# Patient Record
Sex: Female | Born: 1945 | Race: White | Hispanic: No | Smoking: Former smoker
Health system: Southern US, Community
[De-identification: ages and names within clinical notes are randomized; demographics above are authoritative.]

## PROBLEM LIST (undated history)

## (undated) DIAGNOSIS — I4891 Unspecified atrial fibrillation: Secondary | ICD-10-CM

## (undated) DIAGNOSIS — N189 Chronic kidney disease, unspecified: Secondary | ICD-10-CM

## (undated) DIAGNOSIS — I519 Heart disease, unspecified: Secondary | ICD-10-CM

## (undated) DIAGNOSIS — M329 Systemic lupus erythematosus, unspecified: Secondary | ICD-10-CM

## (undated) DIAGNOSIS — F32A Depression, unspecified: Secondary | ICD-10-CM

## (undated) DIAGNOSIS — F329 Major depressive disorder, single episode, unspecified: Secondary | ICD-10-CM

## (undated) DIAGNOSIS — I447 Left bundle-branch block, unspecified: Secondary | ICD-10-CM

## (undated) DIAGNOSIS — I2699 Other pulmonary embolism without acute cor pulmonale: Secondary | ICD-10-CM

## (undated) DIAGNOSIS — I255 Ischemic cardiomyopathy: Secondary | ICD-10-CM

## (undated) DIAGNOSIS — I639 Cerebral infarction, unspecified: Secondary | ICD-10-CM

## (undated) DIAGNOSIS — M3500 Sicca syndrome, unspecified: Secondary | ICD-10-CM

## (undated) DIAGNOSIS — E669 Obesity, unspecified: Secondary | ICD-10-CM

## (undated) DIAGNOSIS — I2510 Atherosclerotic heart disease of native coronary artery without angina pectoris: Secondary | ICD-10-CM

## (undated) DIAGNOSIS — E785 Hyperlipidemia, unspecified: Secondary | ICD-10-CM

## (undated) DIAGNOSIS — I420 Dilated cardiomyopathy: Secondary | ICD-10-CM

## (undated) DIAGNOSIS — M069 Rheumatoid arthritis, unspecified: Secondary | ICD-10-CM

## (undated) DIAGNOSIS — I10 Essential (primary) hypertension: Secondary | ICD-10-CM

## (undated) DIAGNOSIS — L409 Psoriasis, unspecified: Secondary | ICD-10-CM

## (undated) DIAGNOSIS — M797 Fibromyalgia: Secondary | ICD-10-CM

## (undated) DIAGNOSIS — IMO0002 Reserved for concepts with insufficient information to code with codable children: Secondary | ICD-10-CM

## (undated) HISTORY — DX: Essential (primary) hypertension: I10

## (undated) HISTORY — DX: Unspecified atrial fibrillation: I48.91

## (undated) HISTORY — DX: Obesity, unspecified: E66.9

## (undated) HISTORY — DX: Rheumatoid arthritis, unspecified: M06.9

## (undated) HISTORY — DX: Atherosclerotic heart disease of native coronary artery without angina pectoris: I25.10

## (undated) HISTORY — DX: Sjogren syndrome, unspecified: M35.00

## (undated) HISTORY — DX: Reserved for concepts with insufficient information to code with codable children: IMO0002

## (undated) HISTORY — DX: Left bundle-branch block, unspecified: I44.7

## (undated) HISTORY — DX: Ischemic cardiomyopathy: I42.0

## (undated) HISTORY — DX: Hyperlipidemia, unspecified: E78.5

## (undated) HISTORY — DX: Systemic lupus erythematosus, unspecified: M32.9

## (undated) HISTORY — DX: Ischemic cardiomyopathy: I25.5

## (undated) HISTORY — DX: Major depressive disorder, single episode, unspecified: F32.9

## (undated) HISTORY — DX: Psoriasis, unspecified: L40.9

## (undated) HISTORY — PX: OTHER SURGICAL HISTORY: SHX169

## (undated) HISTORY — DX: Heart disease, unspecified: I51.9

## (undated) HISTORY — DX: Chronic kidney disease, unspecified: N18.9

## (undated) HISTORY — DX: Fibromyalgia: M79.7

## (undated) HISTORY — DX: Other pulmonary embolism without acute cor pulmonale: I26.99

## (undated) HISTORY — DX: Cerebral infarction, unspecified: I63.9

## (undated) HISTORY — DX: Depression, unspecified: F32.A

---

## 1980-08-20 HISTORY — PX: MASTECTOMY: SHX3

## 1998-03-08 ENCOUNTER — Ambulatory Visit (HOSPITAL_COMMUNITY): Admission: RE | Admit: 1998-03-08 | Discharge: 1998-03-08 | Payer: Self-pay | Admitting: Family Medicine

## 1999-06-02 ENCOUNTER — Encounter: Admission: RE | Admit: 1999-06-02 | Discharge: 1999-06-02 | Payer: Self-pay | Admitting: Sports Medicine

## 1999-08-28 ENCOUNTER — Encounter: Payer: Self-pay | Admitting: Internal Medicine

## 1999-08-28 ENCOUNTER — Inpatient Hospital Stay (HOSPITAL_COMMUNITY): Admission: EM | Admit: 1999-08-28 | Discharge: 1999-09-09 | Payer: Self-pay | Admitting: Internal Medicine

## 1999-08-29 ENCOUNTER — Encounter: Payer: Self-pay | Admitting: Internal Medicine

## 1999-08-30 ENCOUNTER — Encounter: Payer: Self-pay | Admitting: Internal Medicine

## 1999-08-31 ENCOUNTER — Encounter: Payer: Self-pay | Admitting: Internal Medicine

## 1999-08-31 ENCOUNTER — Encounter: Payer: Self-pay | Admitting: Pulmonary Disease

## 1999-09-01 ENCOUNTER — Encounter: Payer: Self-pay | Admitting: Pulmonary Disease

## 1999-09-02 ENCOUNTER — Encounter: Payer: Self-pay | Admitting: Pulmonary Disease

## 1999-09-05 ENCOUNTER — Encounter: Payer: Self-pay | Admitting: Pulmonary Disease

## 1999-09-09 ENCOUNTER — Encounter: Payer: Self-pay | Admitting: Internal Medicine

## 1999-10-17 ENCOUNTER — Ambulatory Visit: Admission: RE | Admit: 1999-10-17 | Discharge: 1999-10-17 | Payer: Self-pay | Admitting: Pulmonary Disease

## 2000-08-20 DIAGNOSIS — I2510 Atherosclerotic heart disease of native coronary artery without angina pectoris: Secondary | ICD-10-CM

## 2000-08-20 HISTORY — PX: ANGIOPLASTY: SHX39

## 2000-08-20 HISTORY — DX: Atherosclerotic heart disease of native coronary artery without angina pectoris: I25.10

## 2000-11-07 ENCOUNTER — Ambulatory Visit (HOSPITAL_COMMUNITY): Admission: RE | Admit: 2000-11-07 | Discharge: 2000-11-07 | Payer: Self-pay | Admitting: Family Medicine

## 2000-11-07 ENCOUNTER — Encounter: Payer: Self-pay | Admitting: Family Medicine

## 2000-11-14 ENCOUNTER — Ambulatory Visit (HOSPITAL_COMMUNITY): Admission: RE | Admit: 2000-11-14 | Discharge: 2000-11-15 | Payer: Self-pay | Admitting: *Deleted

## 2001-04-15 ENCOUNTER — Encounter (HOSPITAL_COMMUNITY): Admission: RE | Admit: 2001-04-15 | Discharge: 2001-07-14 | Payer: Self-pay | Admitting: Cardiology

## 2001-05-05 ENCOUNTER — Ambulatory Visit (HOSPITAL_COMMUNITY): Admission: RE | Admit: 2001-05-05 | Discharge: 2001-05-05 | Payer: Self-pay | Admitting: Cardiology

## 2001-06-18 ENCOUNTER — Encounter: Payer: Self-pay | Admitting: Family Medicine

## 2001-06-18 ENCOUNTER — Ambulatory Visit (HOSPITAL_COMMUNITY): Admission: RE | Admit: 2001-06-18 | Discharge: 2001-06-18 | Payer: Self-pay | Admitting: Family Medicine

## 2001-07-14 ENCOUNTER — Encounter: Payer: Self-pay | Admitting: Surgery

## 2001-07-14 ENCOUNTER — Encounter: Admission: RE | Admit: 2001-07-14 | Discharge: 2001-07-14 | Payer: Self-pay | Admitting: Surgery

## 2001-12-13 ENCOUNTER — Encounter: Payer: Self-pay | Admitting: Emergency Medicine

## 2001-12-13 ENCOUNTER — Emergency Department (HOSPITAL_COMMUNITY): Admission: EM | Admit: 2001-12-13 | Discharge: 2001-12-13 | Payer: Self-pay | Admitting: Emergency Medicine

## 2002-04-14 ENCOUNTER — Encounter: Payer: Self-pay | Admitting: Emergency Medicine

## 2002-04-15 ENCOUNTER — Inpatient Hospital Stay (HOSPITAL_COMMUNITY): Admission: EM | Admit: 2002-04-15 | Discharge: 2002-04-17 | Payer: Self-pay | Admitting: Emergency Medicine

## 2002-04-24 ENCOUNTER — Ambulatory Visit (HOSPITAL_COMMUNITY): Admission: RE | Admit: 2002-04-24 | Discharge: 2002-04-24 | Payer: Self-pay | Admitting: Cardiology

## 2002-06-29 ENCOUNTER — Inpatient Hospital Stay (HOSPITAL_COMMUNITY): Admission: EM | Admit: 2002-06-29 | Discharge: 2002-07-01 | Payer: Self-pay

## 2002-06-30 ENCOUNTER — Encounter: Payer: Self-pay | Admitting: Internal Medicine

## 2002-10-08 ENCOUNTER — Encounter: Admission: RE | Admit: 2002-10-08 | Discharge: 2003-01-06 | Payer: Self-pay | Admitting: Family Medicine

## 2003-03-21 ENCOUNTER — Encounter: Payer: Self-pay | Admitting: Emergency Medicine

## 2003-03-21 ENCOUNTER — Inpatient Hospital Stay (HOSPITAL_COMMUNITY): Admission: EM | Admit: 2003-03-21 | Discharge: 2003-03-22 | Payer: Self-pay

## 2003-04-05 ENCOUNTER — Encounter: Admission: RE | Admit: 2003-04-05 | Discharge: 2003-04-05 | Payer: Self-pay | Admitting: Family Medicine

## 2003-04-05 ENCOUNTER — Encounter: Payer: Self-pay | Admitting: Family Medicine

## 2003-10-11 ENCOUNTER — Inpatient Hospital Stay (HOSPITAL_COMMUNITY): Admission: EM | Admit: 2003-10-11 | Discharge: 2003-10-16 | Payer: Self-pay

## 2003-10-11 ENCOUNTER — Encounter (INDEPENDENT_AMBULATORY_CARE_PROVIDER_SITE_OTHER): Payer: Self-pay | Admitting: Cardiology

## 2003-10-19 ENCOUNTER — Ambulatory Visit (HOSPITAL_COMMUNITY): Admission: RE | Admit: 2003-10-19 | Discharge: 2003-10-19 | Payer: Self-pay | Admitting: Cardiology

## 2003-11-08 ENCOUNTER — Inpatient Hospital Stay (HOSPITAL_COMMUNITY): Admission: EM | Admit: 2003-11-08 | Discharge: 2003-11-10 | Payer: Self-pay | Admitting: Emergency Medicine

## 2004-04-15 ENCOUNTER — Ambulatory Visit (HOSPITAL_COMMUNITY): Admission: RE | Admit: 2004-04-15 | Discharge: 2004-04-15 | Payer: Self-pay | Admitting: Obstetrics and Gynecology

## 2004-09-24 ENCOUNTER — Inpatient Hospital Stay (HOSPITAL_COMMUNITY): Admission: EM | Admit: 2004-09-24 | Discharge: 2004-10-01 | Payer: Self-pay | Admitting: Emergency Medicine

## 2004-09-25 ENCOUNTER — Encounter (INDEPENDENT_AMBULATORY_CARE_PROVIDER_SITE_OTHER): Payer: Self-pay | Admitting: Interventional Cardiology

## 2005-09-02 ENCOUNTER — Emergency Department (HOSPITAL_COMMUNITY): Admission: EM | Admit: 2005-09-02 | Discharge: 2005-09-02 | Payer: Self-pay | Admitting: Emergency Medicine

## 2006-01-24 ENCOUNTER — Emergency Department (HOSPITAL_COMMUNITY): Admission: EM | Admit: 2006-01-24 | Discharge: 2006-01-24 | Payer: Self-pay | Admitting: Emergency Medicine

## 2006-03-27 ENCOUNTER — Emergency Department (HOSPITAL_COMMUNITY): Admission: EM | Admit: 2006-03-27 | Discharge: 2006-03-27 | Payer: Self-pay | Admitting: *Deleted

## 2006-05-21 ENCOUNTER — Observation Stay (HOSPITAL_COMMUNITY): Admission: EM | Admit: 2006-05-21 | Discharge: 2006-05-22 | Payer: Self-pay | Admitting: Emergency Medicine

## 2006-07-10 ENCOUNTER — Ambulatory Visit (HOSPITAL_COMMUNITY): Admission: RE | Admit: 2006-07-10 | Discharge: 2006-07-10 | Payer: Self-pay | Admitting: *Deleted

## 2006-09-01 ENCOUNTER — Emergency Department (HOSPITAL_COMMUNITY): Admission: EM | Admit: 2006-09-01 | Discharge: 2006-09-01 | Payer: Self-pay | Admitting: Emergency Medicine

## 2006-09-01 ENCOUNTER — Inpatient Hospital Stay (HOSPITAL_COMMUNITY): Admission: EM | Admit: 2006-09-01 | Discharge: 2006-09-12 | Payer: Self-pay | Admitting: Emergency Medicine

## 2006-12-02 ENCOUNTER — Emergency Department (HOSPITAL_COMMUNITY): Admission: EM | Admit: 2006-12-02 | Discharge: 2006-12-03 | Payer: Self-pay | Admitting: *Deleted

## 2007-04-04 ENCOUNTER — Ambulatory Visit: Payer: Self-pay | Admitting: Pulmonary Disease

## 2007-04-10 ENCOUNTER — Ambulatory Visit: Payer: Self-pay | Admitting: Pulmonary Disease

## 2007-04-11 ENCOUNTER — Ambulatory Visit: Payer: Self-pay | Admitting: Cardiovascular Disease

## 2007-04-26 ENCOUNTER — Emergency Department (HOSPITAL_COMMUNITY): Admission: EM | Admit: 2007-04-26 | Discharge: 2007-04-26 | Payer: Self-pay | Admitting: Emergency Medicine

## 2007-05-01 ENCOUNTER — Ambulatory Visit: Payer: Self-pay | Admitting: Pulmonary Disease

## 2007-05-01 LAB — CONVERTED CEMR LAB
ANA Titer 1: 1:160 {titer} — ABNORMAL HIGH
Anti Nuclear Antibody(ANA): POSITIVE — AB
Neutrophil cytoplasmic antibodies,IgG,serum: <1:20 {titer}
Scleroderma (Scl-70) (ENA) Antibody, IgG: <0.2 (ref <1.0)
ds DNA Ab: <1 (ref <5)

## 2007-05-17 ENCOUNTER — Inpatient Hospital Stay (HOSPITAL_COMMUNITY): Admission: EM | Admit: 2007-05-17 | Discharge: 2007-05-20 | Payer: Self-pay | Admitting: Emergency Medicine

## 2007-07-08 ENCOUNTER — Inpatient Hospital Stay (HOSPITAL_COMMUNITY): Admission: EM | Admit: 2007-07-08 | Discharge: 2007-07-23 | Payer: Self-pay | Admitting: Emergency Medicine

## 2007-07-08 ENCOUNTER — Ambulatory Visit: Payer: Self-pay | Admitting: Pulmonary Disease

## 2007-07-21 ENCOUNTER — Encounter: Payer: Self-pay | Admitting: Pulmonary Disease

## 2007-07-22 ENCOUNTER — Encounter: Payer: Self-pay | Admitting: Pulmonary Disease

## 2007-08-04 ENCOUNTER — Ambulatory Visit: Payer: Self-pay | Admitting: Pulmonary Disease

## 2007-08-05 ENCOUNTER — Ambulatory Visit: Payer: Self-pay | Admitting: Critical Care Medicine

## 2007-08-05 ENCOUNTER — Inpatient Hospital Stay (HOSPITAL_COMMUNITY): Admission: RE | Admit: 2007-08-05 | Discharge: 2007-08-13 | Payer: Self-pay | Admitting: Cardiology

## 2007-08-12 HISTORY — PX: OTHER SURGICAL HISTORY: SHX169

## 2007-08-18 ENCOUNTER — Ambulatory Visit: Payer: Self-pay | Admitting: Pulmonary Disease

## 2007-09-09 DIAGNOSIS — F329 Major depressive disorder, single episode, unspecified: Secondary | ICD-10-CM

## 2007-09-09 DIAGNOSIS — J45909 Unspecified asthma, uncomplicated: Secondary | ICD-10-CM | POA: Insufficient documentation

## 2007-11-01 ENCOUNTER — Emergency Department (HOSPITAL_COMMUNITY): Admission: EM | Admit: 2007-11-01 | Discharge: 2007-11-02 | Payer: Self-pay | Admitting: Emergency Medicine

## 2008-04-01 IMAGING — CR DG CHEST 1V PORT
1 series · 1 of 1 positions shown · non-contrast
Comparison: none

CLINICAL DATA: Chest pain.  
 PORTABLE CHEST ? 1 VIEW ? 08/05/07 ? 2988 HOURS:

[AP]
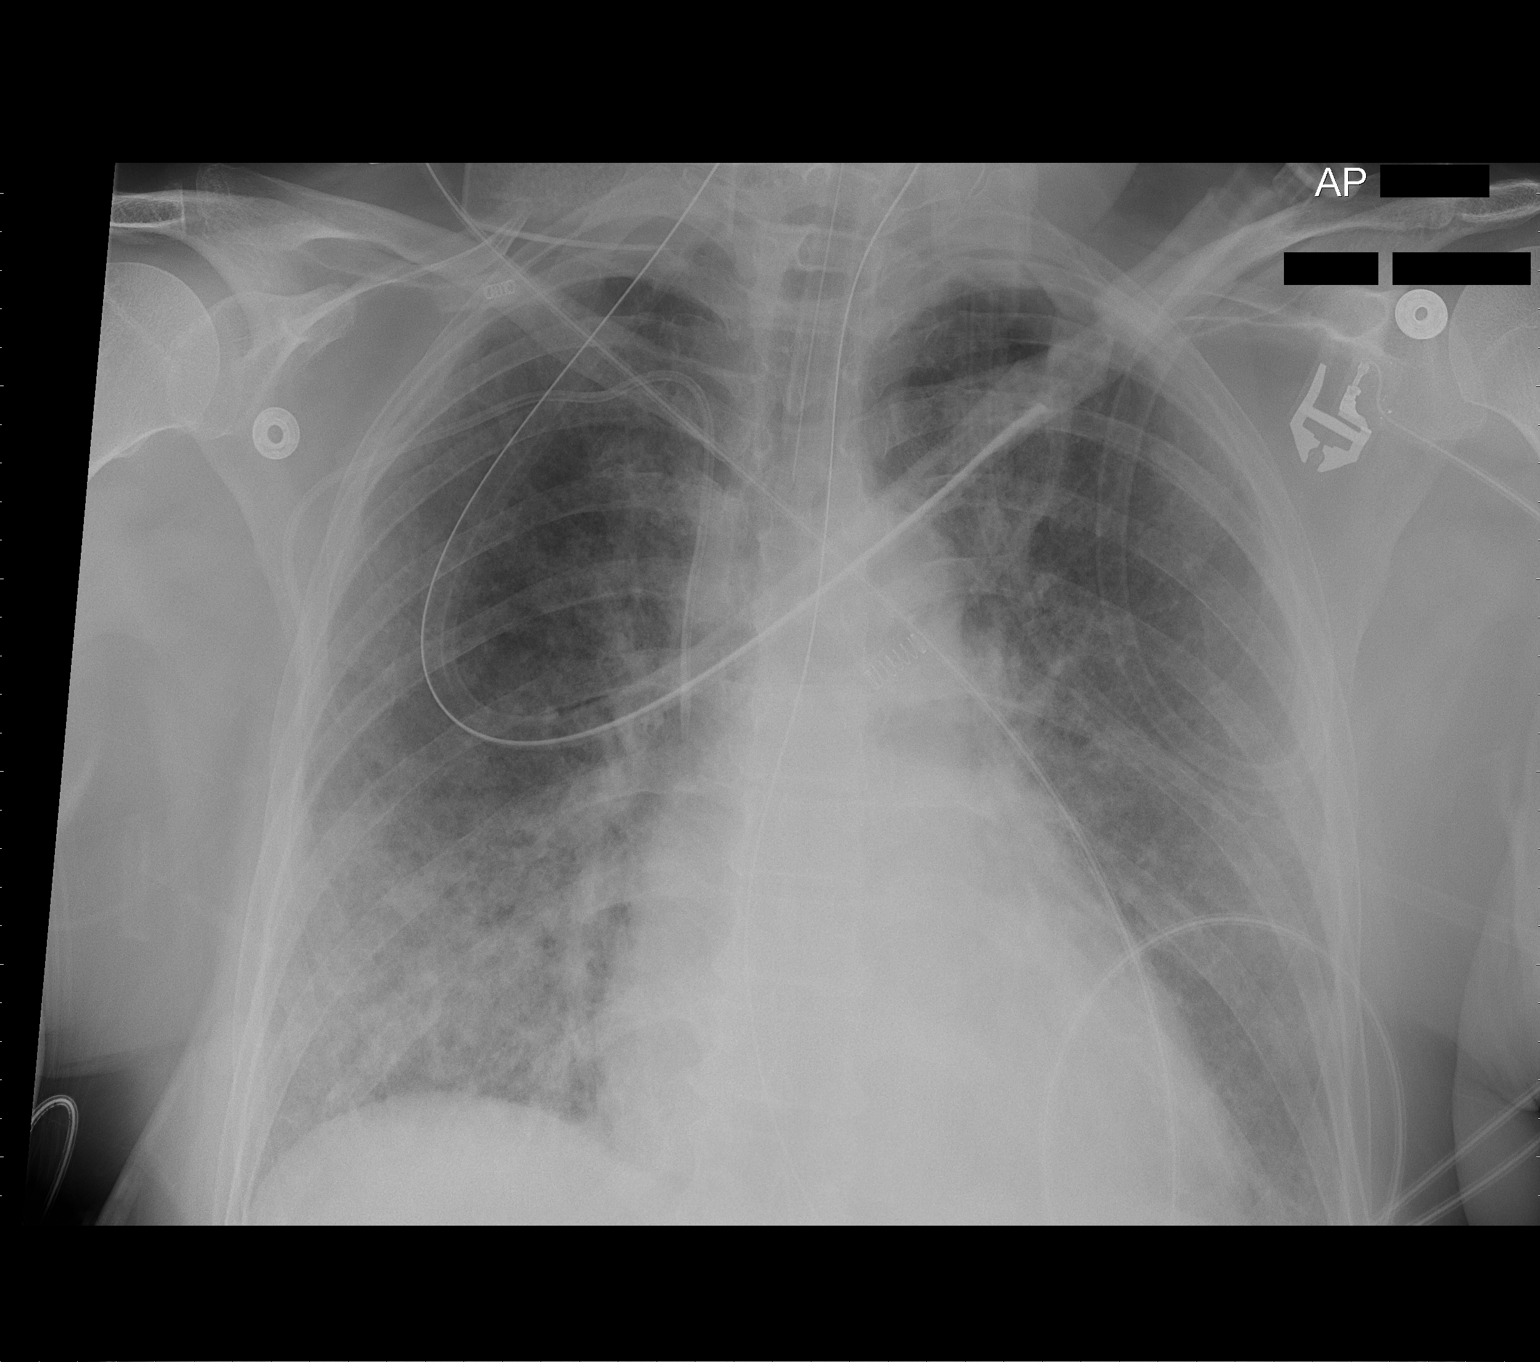

[1 of 1 positions shown; findings below may reference images not displayed]

FINDINGS: Endotracheal tube tip 4 cm above the carina.  Right central line tip superior vena cava.  No pneumothorax.  Feeding tube courses below the diaphragm.  Tip is not included on present exam.  Cardiomegaly.  Slight improvement in diffuse slightly asymmetric airspace disease.  This may represent improvement of pulmonary edema although infectious infiltrate cannot be completely excluded in the appropriate clinical setting.
IMPRESSION: 1.  Slight improvement in diffuse asymmetric airspace disease.  
 2.  Nasogastric tube has been placed otherwise no significant change.

## 2008-05-20 ENCOUNTER — Inpatient Hospital Stay (HOSPITAL_COMMUNITY): Admission: EM | Admit: 2008-05-20 | Discharge: 2008-05-25 | Payer: Self-pay | Admitting: Emergency Medicine

## 2008-05-20 ENCOUNTER — Encounter (INDEPENDENT_AMBULATORY_CARE_PROVIDER_SITE_OTHER): Payer: Self-pay | Admitting: Internal Medicine

## 2008-05-20 ENCOUNTER — Ambulatory Visit: Payer: Self-pay | Admitting: Vascular Surgery

## 2008-05-20 ENCOUNTER — Ambulatory Visit (HOSPITAL_COMMUNITY): Admission: RE | Admit: 2008-05-20 | Discharge: 2008-05-20 | Payer: Self-pay | Admitting: Cardiology

## 2009-10-05 ENCOUNTER — Encounter: Admission: RE | Admit: 2009-10-05 | Discharge: 2009-10-05 | Payer: Self-pay | Admitting: Family Medicine

## 2010-03-10 ENCOUNTER — Inpatient Hospital Stay (HOSPITAL_COMMUNITY): Admission: EM | Admit: 2010-03-10 | Discharge: 2010-03-13 | Payer: Self-pay | Admitting: Emergency Medicine

## 2010-03-10 ENCOUNTER — Encounter (INDEPENDENT_AMBULATORY_CARE_PROVIDER_SITE_OTHER): Payer: Self-pay | Admitting: Internal Medicine

## 2010-03-12 ENCOUNTER — Encounter (INDEPENDENT_AMBULATORY_CARE_PROVIDER_SITE_OTHER): Payer: Self-pay | Admitting: Internal Medicine

## 2010-08-19 ENCOUNTER — Inpatient Hospital Stay (HOSPITAL_COMMUNITY): Admission: EM | Admit: 2010-08-19 | Discharge: 2010-08-22 | Payer: Self-pay | Source: Home / Self Care

## 2010-08-22 ENCOUNTER — Other Ambulatory Visit: Payer: Self-pay | Admitting: Internal Medicine

## 2010-08-22 ENCOUNTER — Encounter (INDEPENDENT_AMBULATORY_CARE_PROVIDER_SITE_OTHER): Payer: Self-pay | Admitting: Internal Medicine

## 2010-10-30 LAB — DIFFERENTIAL
Basophils Absolute: 0.1 10*3/uL (ref 0.0–0.1)
Basophils Absolute: 0.1 10*3/uL (ref 0.0–0.1)
Eosinophils Absolute: 0.1 10*3/uL (ref 0.0–0.7)
Eosinophils Relative: 2 % (ref 0–5)
Lymphocytes Relative: 13 % (ref 12–46)
Lymphocytes Relative: 18 % (ref 12–46)
Lymphs Abs: 1.2 10*3/uL (ref 0.7–4.0)
Lymphs Abs: 1.3 10*3/uL (ref 0.7–4.0)
Monocytes Absolute: 0.7 10*3/uL (ref 0.1–1.0)
Monocytes Relative: 14 % — ABNORMAL HIGH (ref 3–12)
Monocytes Relative: 7 % (ref 3–12)
Neutro Abs: 4.5 10*3/uL (ref 1.7–7.7)
Neutrophils Relative %: 55 % (ref 43–77)
Neutrophils Relative %: 67 % (ref 43–77)

## 2010-10-30 LAB — CBC
HCT: 30.9 % — ABNORMAL LOW (ref 36.0–46.0)
HCT: 32.2 % — ABNORMAL LOW (ref 36.0–46.0)
HCT: 35.9 % — ABNORMAL LOW (ref 36.0–46.0)
Hemoglobin: 11.8 g/dL — ABNORMAL LOW (ref 12.0–15.0)
MCH: 30.3 pg (ref 26.0–34.0)
MCHC: 32.6 g/dL (ref 30.0–36.0)
MCHC: 33.3 g/dL (ref 30.0–36.0)
MCV: 92.3 fL (ref 78.0–100.0)
Platelets: 183 10*3/uL (ref 150–400)
Platelets: 187 10*3/uL (ref 150–400)
Platelets: 194 10*3/uL (ref 150–400)
Platelets: 246 10*3/uL (ref 150–400)
RBC: 3.89 MIL/uL (ref 3.87–5.11)
RDW: 13.8 % (ref 11.5–15.5)
RDW: 13.9 % (ref 11.5–15.5)
RDW: 13.9 % (ref 11.5–15.5)
WBC: 4.9 10*3/uL (ref 4.0–10.5)
WBC: 6.7 10*3/uL (ref 4.0–10.5)
WBC: 7.2 10*3/uL (ref 4.0–10.5)

## 2010-10-30 LAB — GLUCOSE, CAPILLARY
Glucose-Capillary: 102 mg/dL — ABNORMAL HIGH (ref 70–99)
Glucose-Capillary: 110 mg/dL — ABNORMAL HIGH (ref 70–99)
Glucose-Capillary: 112 mg/dL — ABNORMAL HIGH (ref 70–99)
Glucose-Capillary: 112 mg/dL — ABNORMAL HIGH (ref 70–99)
Glucose-Capillary: 134 mg/dL — ABNORMAL HIGH (ref 70–99)
Glucose-Capillary: 143 mg/dL — ABNORMAL HIGH (ref 70–99)
Glucose-Capillary: 85 mg/dL (ref 70–99)
Glucose-Capillary: 97 mg/dL (ref 70–99)

## 2010-10-30 LAB — PROTIME-INR
INR: 1.35 (ref 0.00–1.49)
INR: 1.43 (ref 0.00–1.49)
Prothrombin Time: 16.9 seconds — ABNORMAL HIGH (ref 11.6–15.2)
Prothrombin Time: 20.8 seconds — ABNORMAL HIGH (ref 11.6–15.2)

## 2010-10-30 LAB — COMPREHENSIVE METABOLIC PANEL
ALT: 15 U/L (ref 0–35)
ALT: 18 U/L (ref 0–35)
AST: 25 U/L (ref 0–37)
Albumin: 3.3 g/dL — ABNORMAL LOW (ref 3.5–5.2)
Albumin: 3.3 g/dL — ABNORMAL LOW (ref 3.5–5.2)
Alkaline Phosphatase: 73 U/L (ref 39–117)
Alkaline Phosphatase: 89 U/L (ref 39–117)
BUN: 31 mg/dL — ABNORMAL HIGH (ref 6–23)
CO2: 29 mEq/L (ref 19–32)
Calcium: 8.9 mg/dL (ref 8.4–10.5)
Calcium: 9.2 mg/dL (ref 8.4–10.5)
Calcium: 9.5 mg/dL (ref 8.4–10.5)
Creatinine, Ser: 1.62 mg/dL — ABNORMAL HIGH (ref 0.4–1.2)
Creatinine, Ser: 2.08 mg/dL — ABNORMAL HIGH (ref 0.4–1.2)
GFR calc Af Amer: 36 mL/min — ABNORMAL LOW (ref >60)
GFR calc Af Amer: 39 mL/min — ABNORMAL LOW (ref >60)
GFR calc non Af Amer: 32 mL/min — ABNORMAL LOW (ref >60)
Glucose, Bld: 138 mg/dL — ABNORMAL HIGH (ref 70–99)
Glucose, Bld: 96 mg/dL (ref 70–99)
Potassium: 3.6 mEq/L (ref 3.5–5.1)
Sodium: 142 mEq/L (ref 135–145)
Total Protein: 6.1 g/dL (ref 6.0–8.3)
Total Protein: 6.7 g/dL (ref 6.0–8.3)

## 2010-10-30 LAB — CK TOTAL AND CKMB (NOT AT ARMC)
Relative Index: INVALID (ref 0.0–2.5)
Total CK: 37 U/L (ref 7–177)

## 2010-10-30 LAB — URINE MICROSCOPIC-ADD ON

## 2010-10-30 LAB — TROPONIN I: Troponin I: 0.02 ng/mL (ref 0.00–0.06)

## 2010-10-30 LAB — BASIC METABOLIC PANEL
BUN: 31 mg/dL — ABNORMAL HIGH (ref 6–23)
CO2: 24 mEq/L (ref 19–32)
Calcium: 9.4 mg/dL (ref 8.4–10.5)
Glucose, Bld: 205 mg/dL — ABNORMAL HIGH (ref 70–99)
Potassium: 4.1 mEq/L (ref 3.5–5.1)

## 2010-10-30 LAB — URINALYSIS, ROUTINE W REFLEX MICROSCOPIC
Bilirubin Urine: NEGATIVE
Specific Gravity, Urine: 1.011 (ref 1.005–1.030)
Urobilinogen, UA: 0.2 mg/dL (ref 0.0–1.0)
pH: 5.5 (ref 5.0–8.0)

## 2010-10-30 LAB — MAGNESIUM: Magnesium: 2.4 mg/dL (ref 1.5–2.5)

## 2010-10-30 LAB — POCT CARDIAC MARKERS: Myoglobin, poc: 101 ng/mL (ref 12–200)

## 2010-10-30 LAB — LIPID PANEL
HDL: 40 mg/dL (ref >39)
Total CHOL/HDL Ratio: 3.4 RATIO
Triglycerides: 82 mg/dL (ref <150)

## 2010-10-30 LAB — HEMOGLOBIN A1C: Hgb A1c MFr Bld: 6.5 % — ABNORMAL HIGH (ref <5.7)

## 2010-10-30 LAB — APTT: aPTT: 28 seconds (ref 24–37)

## 2010-11-04 LAB — CULTURE, BLOOD (ROUTINE X 2)

## 2010-11-04 LAB — GLUCOSE, CAPILLARY
Glucose-Capillary: 233 mg/dL — ABNORMAL HIGH (ref 70–99)
Glucose-Capillary: 244 mg/dL — ABNORMAL HIGH (ref 70–99)
Glucose-Capillary: 267 mg/dL — ABNORMAL HIGH (ref 70–99)
Glucose-Capillary: 312 mg/dL — ABNORMAL HIGH (ref 70–99)
Glucose-Capillary: 439 mg/dL — ABNORMAL HIGH (ref 70–99)

## 2010-11-04 LAB — CBC
HCT: 26 % — ABNORMAL LOW (ref 36.0–46.0)
HCT: 29.4 % — ABNORMAL LOW (ref 36.0–46.0)
Hemoglobin: 11.2 g/dL — ABNORMAL LOW (ref 12.0–15.0)
Hemoglobin: 9.1 g/dL — ABNORMAL LOW (ref 12.0–15.0)
MCHC: 33.6 g/dL (ref 30.0–36.0)
MCHC: 33.8 g/dL (ref 30.0–36.0)
MCHC: 33.8 g/dL (ref 30.0–36.0)
MCHC: 34 g/dL (ref 30.0–36.0)
MCV: 93.7 fL (ref 78.0–100.0)
Platelets: 236 10*3/uL (ref 150–400)
RBC: 3.59 MIL/uL — ABNORMAL LOW (ref 3.87–5.11)
RDW: 13.2 % (ref 11.5–15.5)
RDW: 13.3 % (ref 11.5–15.5)
RDW: 14.1 % (ref 11.5–15.5)
WBC: 38.2 10*3/uL — ABNORMAL HIGH (ref 4.0–10.5)
WBC: 45.3 10*3/uL — ABNORMAL HIGH (ref 4.0–10.5)

## 2010-11-04 LAB — PROTIME-INR
INR: 3.11 — ABNORMAL HIGH (ref 0.00–1.49)
Prothrombin Time: 30.6 seconds — ABNORMAL HIGH (ref 11.6–15.2)
Prothrombin Time: 31.8 seconds — ABNORMAL HIGH (ref 11.6–15.2)

## 2010-11-04 LAB — BASIC METABOLIC PANEL
BUN: 30 mg/dL — ABNORMAL HIGH (ref 6–23)
BUN: 34 mg/dL — ABNORMAL HIGH (ref 6–23)
BUN: 35 mg/dL — ABNORMAL HIGH (ref 6–23)
CO2: 19 mEq/L (ref 19–32)
Calcium: 8.6 mg/dL (ref 8.4–10.5)
Calcium: 8.8 mg/dL (ref 8.4–10.5)
Chloride: 104 mEq/L (ref 96–112)
Creatinine, Ser: 2.33 mg/dL — ABNORMAL HIGH (ref 0.4–1.2)
GFR calc non Af Amer: 21 mL/min — ABNORMAL LOW (ref >60)
GFR calc non Af Amer: 29 mL/min — ABNORMAL LOW (ref >60)
Glucose, Bld: 272 mg/dL — ABNORMAL HIGH (ref 70–99)
Glucose, Bld: 278 mg/dL — ABNORMAL HIGH (ref 70–99)
Glucose, Bld: 314 mg/dL — ABNORMAL HIGH (ref 70–99)
Potassium: 5 mEq/L (ref 3.5–5.1)
Sodium: 134 mEq/L — ABNORMAL LOW (ref 135–145)

## 2010-11-04 LAB — ANTI-NUCLEAR AB-TITER (ANA TITER): ANA Titer 1: 1:40 {titer} — ABNORMAL HIGH

## 2010-11-04 LAB — DIFFERENTIAL
Basophils Absolute: 0 10*3/uL (ref 0.0–0.1)
Basophils Relative: 0 % (ref 0–1)
Eosinophils Absolute: 0 10*3/uL (ref 0.0–0.7)
Eosinophils Absolute: 0.5 10*3/uL (ref 0.0–0.7)
Eosinophils Relative: 1 % (ref 0–5)
Lymphocytes Relative: 3 % — ABNORMAL LOW (ref 12–46)
Monocytes Absolute: 0.9 10*3/uL (ref 0.1–1.0)
Neutro Abs: 36.3 10*3/uL — ABNORMAL HIGH (ref 1.7–7.7)
Neutro Abs: 43 10*3/uL — ABNORMAL HIGH (ref 1.7–7.7)
Neutrophils Relative %: 95 % — ABNORMAL HIGH (ref 43–77)
Neutrophils Relative %: 95 % — ABNORMAL HIGH (ref 43–77)
WBC Morphology: INCREASED

## 2010-11-04 LAB — URINALYSIS, ROUTINE W REFLEX MICROSCOPIC
Glucose, UA: NEGATIVE mg/dL
Nitrite: NEGATIVE
Specific Gravity, Urine: 1.018 (ref 1.005–1.030)
pH: 5 (ref 5.0–8.0)

## 2010-11-04 LAB — COMPREHENSIVE METABOLIC PANEL
ALT: 34 U/L (ref 0–35)
AST: 25 U/L (ref 0–37)
CO2: 18 mEq/L — ABNORMAL LOW (ref 19–32)
Calcium: 8.5 mg/dL (ref 8.4–10.5)
GFR calc Af Amer: 21 mL/min — ABNORMAL LOW (ref >60)
GFR calc non Af Amer: 17 mL/min — ABNORMAL LOW (ref >60)
Sodium: 126 mEq/L — ABNORMAL LOW (ref 135–145)

## 2010-11-04 LAB — ANA: Anti Nuclear Antibody(ANA): POSITIVE — AB

## 2010-11-04 LAB — LACTIC ACID, PLASMA: Lactic Acid, Venous: 2.6 mmol/L — ABNORMAL HIGH (ref 0.5–2.2)

## 2010-11-04 LAB — TECHNOLOGIST SMEAR REVIEW: Path Review: INCREASED

## 2010-12-13 ENCOUNTER — Encounter: Payer: Self-pay | Admitting: *Deleted

## 2010-12-13 ENCOUNTER — Encounter: Payer: Self-pay | Admitting: Internal Medicine

## 2010-12-14 ENCOUNTER — Encounter: Payer: Self-pay | Admitting: Internal Medicine

## 2010-12-20 ENCOUNTER — Encounter: Payer: Self-pay | Admitting: Internal Medicine

## 2010-12-29 ENCOUNTER — Other Ambulatory Visit (HOSPITAL_COMMUNITY): Payer: Self-pay | Admitting: Family Medicine

## 2010-12-29 ENCOUNTER — Ambulatory Visit (HOSPITAL_COMMUNITY)
Admission: RE | Admit: 2010-12-29 | Discharge: 2010-12-29 | Disposition: A | Discharge status: Home / Self Care | Payer: Medicare Other | Source: Ambulatory Visit | Attending: Family Medicine | Admitting: Family Medicine

## 2010-12-29 DIAGNOSIS — R0602 Shortness of breath: Secondary | ICD-10-CM | POA: Insufficient documentation

## 2010-12-29 DIAGNOSIS — J984 Other disorders of lung: Secondary | ICD-10-CM | POA: Insufficient documentation

## 2010-12-29 DIAGNOSIS — R079 Chest pain, unspecified: Secondary | ICD-10-CM | POA: Insufficient documentation

## 2010-12-29 DIAGNOSIS — R52 Pain, unspecified: Secondary | ICD-10-CM

## 2011-01-02 NOTE — Op Note (Signed)
[name], [name]                ACCOUNT NO.:  [id number]   MEDICAL RECORD NO.:  [id number]          PATIENT TYPE:  INP   LOCATION:  2904                         FACILITY:  [facility]   PHYSICIAN:  [name], M.D.  DATE OF BIRTH:  08-13-1946   DATE OF PROCEDURE:  08/12/2007  DATE OF DISCHARGE:                               OPERATIVE REPORT   PROCEDURES PERFORMED:  1. Insertion implantable cardiac defibrillator, biventricular      pacemaker.  2. Left subclavian venogram.  3. Coronary sinus venogram.   INDICATION:  [name] is a 65 year old woman with severe nonischemic  cardiomyopathy, LVEF of 20-25%, moderately severe mitral regurgitation,  left bundle branch block and class III-IV heart failure.  She meets  criteria for insertion of a prophylactic ICD under the SCUD-HEFT  criteria as well as placement of a left ventricular lead due to her  severe LV function, class III-IV heart failure and wide complex QRS.   PROCEDURE NOTE:  The patient is brought to the cardiac catheterization  laboratory in the fasting state.  The left prepectoral region was  prepped and draped in the usual sterile fashion.  Local anesthesia was  obtained with infiltration of 1% lidocaine with epinephrine throughout  the left prepectoral region.  A left subclavian venogram was performed  in the AP projection via peripheral injection of 20 mL of Omnipaque.  A  digital cine angiogram was obtained and road mapped to guide future  left subclavian puncture.  The left subclavian vein did demonstrate the  vein to be widely patent and coursing in a normal fashion over the  anterior surface of the first rib and beneath the middle third of the  clavicle.  There was no evidence for persistence of the left superior  vena cava.   A 7-8 cm incision was then made in the deltopectoral groove and this was  carried down by sharp dissection and electrocautery to the prepectoral  fascia where a pocket was formed  inferiorly and medially using blunt  dissection and electrocautery.  The pocket was then packed with 1%  kanamycin soaked gauze.  The left subclavian vein was then punctured  three times initially with an 18-gauge thin-wall needle and then two  separate punctures with a micropuncture set.  A 3038 J-wires were  placed.  Over the initial J-wire a 9-French tearaway sheath and dilator  were advanced.  The dilator and wire removed and the right ventricular  lead was advanced to the level of the right atrium and the sheath was  torn away.  Using standard technique and fluoroscopic landmarks the lead  was manipulated into the right ventricular apex.  This was an active  fixation lead and the screw was advanced as appropriate.  It was tested  for diaphragmatic pacing at 10 volts and none was found.  Adequate  pacing parameters were obtained as will be reported below.  The lead was  then sutured into place using three separate 0-silk ligatures.  Over the  second guidewire a 7-French tearaway sheath and dilator were advanced.  The dilator and wire were removed  and an atrial lead was advanced to the  level of the right atrium and the sheath was torn away.  Using standard  technique and fluoroscopic landmarks the lead was manipulated into the  right atrial appendage.  Excellent pacing parameters were obtained as  will be noted below.  This was also an active fixation lead and the  screw was advanced as appropriate.  It was tested for diaphragmatic  pacing at 10 volts and none was found.  The lead was then sutured into  place using three separate 0-silk ligatures.  Over the remaining  guidewire a Medtronic MB-II with dilator was advanced to the level of  the right atrium.  The dilator was removed and the wire was allowed  remain in place.  Using standard technique and fluoroscopic landmarks  the coronary sinus was cannulated initially with the vein and then with  the guiding catheter.  Coronary sinus  venograms were obtained in the RAO  and LAO projections and road mapped to help guide future cannulation  of the left ventricular vein.  Multiple wire exchanges with the tight J-  wire and a floppy wire were required to manipulate the guiding catheter  within the coronary sinus atraumatically.  An anterolateral vein with  distal branches that moved inferiorly toward the apex was selected and  ultimately it was cannulated with a 0.014 Pro water guidewire.  The  Medtronic bipolar lead was then advanced over this guidewire and  excellent stable position obtained within the vein.  Excellent pacing  parameters were obtained as well and that is reported below.  There was  diaphragmatic pacing at 7 volts but not below that.  The guiding  catheter was then removed by the slit technique and the lead was sutured  into place using three separate 0-silk ligatures.  The kanamycin soaked  gauze was then removed from the pocket.  The pocket was copiously  irrigated using 1% kanamycin solution.  The leads were then attached to  pace shock generator under the direction of the Medtronic  representative.  Each lead was identified and placed into the correct  receptacle, tightened into place and tested for security.  The leads  were then wound beneath the pace shock generator and the generator was  placed in the pocket.  An 0-silk anchoring suture was applied to the  pectoralis muscle.   We then prepared for defibrillation threshold testing.  After adequate  and moderate sedation using midazolam, fentanyl and hydromorphone  ventricular fibrillation was induced by the shock on T technique.  It  was promptly detected with a charge time of 3.9 seconds and a 20 joules  shock delivered with prompt return of sinus rhythm.  Total detection and  charge time was 7.5 seconds.  The shocking impedance was 50 ohms.   The wound was then closed using 4-0 Vicryl in a running fashion for the  subcutaneous layer.  Two  layers applied.  The skin was approximated  using 4-0 Vicryl in a running subcuticular fashion.  Steri-Strips and a  sterile dressing were applied and the patient was transported to the  recovery area in stable condition.   EQUIPMENT DATA:  The pace shock generator is a Medtronic Concerto model  [id number], serial number [id number].  The pace shock right ventricular  lead was a sprint Quattro Secure model number [id number], serial number [id number]  [id number].  The atrial lead is a Medtronic model number [id number], serial  number [id number].  The left ventricular lead  is a Medtronic model  number [id number], serial number [id number].   PACING DATA:  The right ventricular lead detected a 21.3 mV R-wave.  The  pacing threshold was 0.1 volts at 0.5 milliseconds pulse width.  The  impedance was 770 ohms resulting in a current at capture threshold of  1.5 MA.  The right atrial lead detected a 3.4 mV P-wave.  The pacing  threshold was 0.8 volts at 0.5 milliseconds pulse width.  The impedance  was 734 ohms resulting in a current at capture threshold of 1.3 MA.  The  left ventricular lead detected a 12.4 mV R-wave.  The pacing threshold  was 0.5 volts at 0.5 milliseconds pulse width.  The impedance was 751  ohms resulting in a current at capture threshold of 0.8 MA.  As noted  diaphragmatic and left pectoral pacing was not obtained until the output  was increased to 7 volts.  This included with the patient taking a deep  breath.      [name], M.D.  Electronically Signed     JHE/MEDQ  D:  08/12/2007  T:  08/13/2007  Job:  [id number]

## 2011-01-02 NOTE — H&P (Signed)
NAMEJASDEEP, DEJARNETT                ACCOUNT NO.:  [id number]   MEDICAL RECORD NO.:  [id number]          PATIENT TYPE:  INP   LOCATION:  5730                         FACILITY:  [facility]   PHYSICIAN:  Barnetta Chapel, MDDATE OF BIRTH:  November 01, 1945   DATE OF ADMISSION:  05/17/2007  DATE OF DISCHARGE:                              HISTORY & PHYSICAL   PRIMARY CARE PHYSICIAN:  Royetta Crochet, M.D.   CHIEF COMPLAINT:  1. Altered mental status.  2. Fatigue and weakness.   HISTORY OF PRESENT ILLNESS:  The patient is a 65 year old female with  past medical history significant for diabetes mellitus, endometriosis,  hypertension, pneumonia, lupus, MI, congestive heart failure with  diastolic dysfunction, asthma, bronchitis, coronary artery disease, left  bundle branch block, depression, psoriatic arthropathy, and  dyslipidemia.  According to the patient, she has not been feeling well  for the last 6 weeks.  The patient was seen by the primary care Liliya Fullenwider  and was treated for possible lung infection and inflammation.  The  patient was given antibiotics (Levaquin) as well as prednisone taper.  The patient had fever, chills, and cough initially.  She denies any  history of fever or chills for now.  She still has cough which is  productive of greenish sputum.  She was seen at [facility] walk-in clinic  earlier on with altered mental status, weakness, elevated blood sugar  and chest congestion.  No fever, no headache, no neck pain, no chest  pain, no GI symptoms, no urinary symptoms.   PAST MEDICAL HISTORY:  Essentially as above.   ALLERGIES:  SULFONAMIDES.   MEDICATIONS PRIOR TO ADMISSION:  1. Coreg 3.125 mg p.o. twice daily.  2. Furosemide 40 mg p.o. twice daily.  3. Potassium.  4. Isosorbide mononitrate 30 mg p.o. once daily.  5. Cymbalta 60 mg p.o. once daily.  6. Synthroid 75 mcg p.o. once daily.  7. Vytorin 10/40 1 tablet p.o. once daily.  8. Aspirin 81 mg p.o. once daily.  9.  Wellbutrin XL 150 mg p.o. once daily.  10.Ativan 1 mg p.o. p.r.n.  11.Nitroglycerin 0.4 mg sublingual as needed.  12.Hydrocodone APAP 5/500 as needed.  13.Singulair 10 mg p.o. once daily.  14.Advair 250/50 as needed!  15.Vitamin D.   SOCIAL HISTORY:  The patient is divorced.  She has no children.  She  smokes about 4-1/2 packs of cigarettes every week and has smoked for  over 40 years.  She drinks alcohol from time to time.  No history of  illicit substance abuse.   FAMILY HISTORY:  Significant for depression, bipolar, and hypertension.   REVIEW OF SYSTEMS:  Essentially as above.   PHYSICAL EXAMINATION:  VITAL SIGNS:  Temperature 97, blood pressure  113/61, respiratory rate 20.  GENERAL:  The patient is quite weak and ill-looking.  HEENT:  No pallor.  No jaundice.  Extraocular muscle movements intact.  The tongue is dry.  NECK:  No raised JVD or lymphadenopathy and the neck is supple.  LUNGS:  Clear to auscultation.  CARDIOVASCULAR:  S1 and S2.  ABDOMEN:  Obese, soft, nontender.  No organomegaly.  Bowel sounds are  present.  NEUROLOGIC:  Nonfocal.  EXTREMITIES:  No edema.   LABORATORY DATA:  Troponin less than 0.05.  White count 13.1, hemoglobin  11.8, hematocrit 35.9, platelet count 203, MCV 94.1.  UA reveals more  than 1000 mg/dl of glucose.  Creatinine 1.4 with BUN of 26, sodium 106,  potassium 5.6, chloride 81.  Blood sugar was too high to be analyzed.   Chest x-ray reveals no acute abnormalities.   IMPRESSION:  1. Uncontrolled diabetes mellitus (question the role of steroids).  2. Altered mental status.  3. Hyperkalemia.  4. Acute renal failure, suspect prerenal.  5. Dehydration.  6. History of recent chest infection (just completed Levaquin).   PLAN:  Will admit the patient to a stepdown unit.  Will start the  patient on Glucommander.  Will rehydrate the patient and follow the  renal function.  Will continue antibiotics (IV Avelox and IV Zosyn).  Will follow  white count as necessary.  The hyperkalemia will most likely  resolve with hydration and insulin.  Further management will depend on  hospital course.  Meanwhile, we will follow cardiac enzymes as per  protocol.      Barnetta Chapel, MD  Electronically Signed     SIO/MEDQ  D:  05/19/2007  T:  05/19/2007  Job:  [id number]   cc:   Royetta Crochet, MD

## 2011-01-02 NOTE — Cardiovascular Report (Signed)
[name], SINGLE                ACCOUNT NO.:  [id number]   MEDICAL RECORD NO.:  [id number]          PATIENT TYPE:  INP   LOCATION:  2904                         FACILITY:  [facility]   PHYSICIAN:  [name], M.D.     DATE OF BIRTH:  1945/10/24   DATE OF PROCEDURE:  08/04/2007  DATE OF DISCHARGE:  08/13/2007                            CARDIAC CATHETERIZATION   REFERRING PHYSICIAN:  Dr. Holley Bouche.   PROCEDURES:  1. Left heart catheterization.  2. Coronary angiography.  3. Left ventriculography.   OPERATOR:  Evelina Dun, MD   COMPLICATIONS:  None.   IV ACCESS:  The right femoral artery 6-French sheath.   IV MEDICATIONS:  Versed 1 mg IV, fentanyl 25 mcg IV.   INDICATIONS:  Congestive heart failure, ischemic cardiomyopathy.   This a 65 year old female with a history of known coronary disease as  well as cardiomyopathy out of proportion, underlying coronary disease  also recently diagnosed with significant mitral regurgitation who  presented with several episodes of heart failure one, of which required  mechanical ventilation because of recurrent heart failure.  She now  presents for repeat cardiac catheterization to reevaluate coronary  anatomy.  The patient was brought to the cardiac catheterization  laboratory in the fasting nonsedated state.  Informed consent was  obtained.  The patient was connected to continuous heart rate with pulse  oximetry monitoring and intermittent blood pressure monitoring.  The  right groin was prepped and draped in a sterile fashion.  Xylocaine 1%  was used for local anesthesia.  Using a modified Seldinger technique, a  6-French sheath was placed in the right femoral artery.  Under  fluoroscopic guidance, a 6-French JL-4 catheter was placed in the left  coronary artery.  There was great difficulty in engaging the left main.  Each time the left main was engaged with the catheter, the patient would  develop chest pain as well as damping of  the pressure upon catheter  engagement.  The catheter was removed over a guidewire, and a 6-French  JR-4 catheter was placed under fluoroscopic guidance to the right  coronary artery.  Multiple cine films were taken at 30 degree RAO and 40  degree LAO views.  This catheter was then exchanged out over a guidewire  for a 6-French JL-4 catheter again, and again, multiple attempts were  made at engaging the left main coronary artery.  The catheter was  removed over a guidewire.  A 5-French JL-4 catheter was placed into the  left coronary ostium.  Successful engagement was performed, and patient  had multiple cine films that were taken at 33 RAO 43 LAO views, but the  patient again did have more chest pain and damping of the pressure upon  engagement of the left main coronary artery.  The patient was given 1 mg  of Versed and 25 mcg fentanyl at the beginning.  The patient was started  on IV nitroglycerin drip because of continued chest pain, started at 10  mcg per minute.  Blood pressure was stable throughout the procedure.  The patient did have some problems with  her O2 saturations dropping  during the procedure.  She was a mouth breather and did not really  respond well to the nasal cannula when the face mask was placed or O2  saturations came up in the 90% range.  The JL-4 catheter was exchanged  out over a guidewire for a 6-French angled pigtail catheter which was  placed under fluoroscopic guidance in the left ventricular cavity.  The  LVEDP was only 10 mmHg, and left ventriculography was then performed  using a total of 30 mL of contrast at 15 mL per second.  The catheter  was then pulled back across the aortic valve with no significant  gradient noted.  After consultation with Dr. Verdis Prime about the left  main and being unable to adequately evaluate it, it was decided to place  a 6-French FL-3.5 side-hole catheter into the left main artery, and  multiple cine films were taken at 33  RAO 43 LAO views.  Two-hundred mcg  of intracoronary nitroglycerin were also administered with repeat  angiography performed to establish that the left main coronary artery  was patent as well as the LAD.  The catheter was then removed over a  guidewire, and the patient was transferred to the holding room in stable  condition with stable vital signs.  O2 saturations were 91%.  The  patient then started developing shortness of breath and was placed on  100% O2 face mask.  She was given 80 mg of IV Lasix.  She is very  anxious and coughing and developed acute respiratory distress in the  holding room, coughing up bright red blood.  Critical care medicine was  immediately called, and Dr. Craige Cotta was present for evaluation.  Foley  catheter was inserted; 5 mg of IV Versed was given as well as 100 mcg of  fentanyl.  The patient was orally intubated by Dr. Craige Cotta.  Blood pressure  postintubation was 127/60 with heart rate of 142.  O2 saturations  initially were 76%.  There was bright red, frothy blood coming out of  the ET tube which was suctioned.  O2 saturations eventually came up into  the mid 90% range.  The patient was transferred to the ICU.   RESULTS OF CARDIAC CATHETERIZATION:  The right coronary artery is widely  patent and shows a stent in the proximal to midportion which is widely  patent.  The ongoing RCA bifurcates into a posterior descending artery a  posterolateral artery, both of which are widely patent.   Left main coronary artery is widely patent and bifurcates in a left  anterior descending artery and left circumflex artery.   Left anterior descending artery is widely patent throughout its course  of its apex, giving rise to 2 diagonal branches, both of which are  widely patent.   The left circumflex is widely patent throughout its course in the AV  groove, giving rise to 1 obtuse marginal branch which is rather large  and bifurcates into 2 daughter branches, both of which are  widely  patent.   Left ventriculography shows markedly dilated left ventricular cavity  dimensions, EF 15-20% with severe LV systolic dysfunction and severe  mitral regurgitation.  The left ventricular pressure 109/14 mmHg, aortic  pressure 111/62 mmHg.   ASSESSMENT:  1. History of coronary disease with history of right coronary artery      stent with widely patent coronary arteries on today's cath.  2. Markedly dilated left ventricular cavity dimensions with severe  left ventricular systolic dysfunction, the EF 15-20%.  3. Markedly dilated left atrium with severe mitral regurgitation,      although left ventricular end-diastolic pressure at the time of      cath was only 10 mmHg.  4. Severe mitral regurgitation, questionably secondary to annular      dilatation  5. Congestive heart failure with recent hospitalization for vent      dependent respiratory failure with congestive heart failure felt      secondary to congestive heart failure plus/minus lupus pneumonitis      and pneumonia.  6. Acute respiratory distress with hemoptysis requiring emergent      intubation in the holding room postcath was likely secondary to      increased dye load required to visualize adequately the left main.  7. Questionable sleep apnea.  8. Lupus.  9,  Diabetes mellitus.  1. Chronic obstructive pulmonary disease with asthma.   PLAN:  Admit to the CCU, will need bi-Ventricular AICD placed secondary  to heart failure and markedly dilated left ventricular function with  left bundle branch block once the patient recovers.  The patient does  have severe mitral regurgitation but given her severe left ventricular  dysfunction, she is too high risk for mitral valve replacement,  especially given her underlying lung disease.  This was confirmed by Dr.  Craige Cotta.      [name], M.D.  Electronically Signed     TT/MEDQ  D:  08/28/2007  T:  08/28/2007  Job:  [id number]   cc:   Coralyn Helling, MD   Holley Bouche, M.D.  Cath lab

## 2011-01-02 NOTE — Consult Note (Signed)
[name], [name]                ACCOUNT NO.:  [id number]   MEDICAL RECORD NO.:  [id number]          PATIENT TYPE:  INP   LOCATION:  4732                         FACILITY:  [facility]   PHYSICIAN:  [name], M.D.     DATE OF BIRTH:  11-Jun-1946   DATE OF CONSULTATION:  07/08/2007  DATE OF DISCHARGE:                                 CONSULTATION   REFERRING PHYSICIAN:  Dr. Adela Glimpse.   CHIEF COMPLAINT:  CHF.   This is a 65 year old female with a known history of coronary disease  status post stent to the RCA, ischemic cardiomyopathy, EF 35% by echo in  2005.  She has had several episodes of congestive heart failure.  Last  evening around midnight, she developed dyspnea while lying in bed  worsening to the point she had to sit straight up to breathe.  By 3:00  a.m. she presented to the ER via EMS.  Chest x-ray confirmed pulmonary  edema. BNP was mildly elevated at 286.  She denies any chest pain.  She  does get short of breath doing household chores but she says this has  been typical for her for the past several years since her MI in 2002.   Her last catheterization in 2005 was done for a CHF exacerbation with  elevated troponin.  It showed a patent RCA stent otherwise  nonobstructive disease.  Her last Cardiolite study in 2006 showed an EF  of 34% with no ischemia and an inferior septal scar.   ALLERGIES:  SULFA, LASIX, NEOMYCIN CONTRAST MEDIA.   MEDICATIONS PRIOR TO ADMISSION:  1. Lantus insulin 20 units subcu daily.  2. Metformin 1000 mg b.i.d.  3. Vytorin 10/40 mg a day.  4. Advair.  5. Coreg 3.125 mg b.i.d.  6. Imdur 30 mg a day.  7. Cymbalta 60 mg a day.  8. Synthroid 75 mcg daily.  9. Aspirin 81 mg daily.  10.Lorazepam p.r.n.  11.Wellbutrin 150 mg daily.  12.Vicodin p.r.n.  13.Singulair 10 mg a day.  14.Iron.  15.Vitamin D.   CURRENT MEDICATIONS:  1. Xanax.  2. Baby aspirin.  3. Wellbutrin 150 mg a day.  4. Coreg 3.125 mg p.o. b.i.d.  5. Cymbalta 60 mg a day.  6. Vytorin 10/40 mg a day.  7. Iron.  8. Advair.  9. Lasix 40 mg IV q.12 h.  10.Sliding scale insulin.  11.Imdur 30 mg a day.  12.Lantus insulin 20 units subcu daily.  13.Synthroid 75 mcg a day.  14.Singulair 10 mg a day.  15.K-Dur 20 mEq a day.   SOCIAL HISTORY:  She occasionally smokes. She has cut back to 2  cigarettes per day.  She denies any alcohol or illicit drugs.   PAST MEDICAL HISTORY:  Significant for coronary disease and ischemic  cardiomyopathy as stated earlier. COPD, lupus, diabetes, left bundle  branch block and depression.   FAMILY HISTORY:  Her mother has dementia, she is in her 28s.  Her father  died in his 27s of coronary disease.   REVIEW OF SYSTEMS:  Otherwise is negative.   PHYSICAL EXAM:  Blood pressure  100/60, pulse 79, respirations 19, O2  saturations 90% on 2 liters. Is and Os negative 20/150 today.  HEENT:  Benign.  NECK:  Supple without lymphadenopathy. Carotid upstrokes +2 bilaterally  with no bruits.  LUNGS:  Have crackles at the bases, otherwise clear to auscultation  throughout.  HEART:  Regular rate and rhythm.  No murmurs, rubs or gallops, normal  S1, S2.  ABDOMEN:  Benign.  EXTREMITIES:  No edema.   EKG shows sinus rhythm with left bundle branch block.  Telemetry shows  normal sinus rhythm with one episode of nonsustained atrial tachycardia.   LABORATORY DATA:  Hemoglobin A1c 7.4.  BNP 286, white cell count 12.6,  hemoglobin 9.4, hematocrit 27.9, platelet count 254. Sodium 140,  potassium 3.5, chloride 103, bicarb was not done. BUN 10, creatinine  1.1, glucose 147.  Chest x-ray shows pulmonary edema.   ASSESSMENT:  1. Acute pulmonary edema.  2. Left bundle branch block.  3. Known coronary disease with history of inferior MI in 2002 with      subsequent stent to the RCA.  4. Ischemic cardiomyopathy, ejection fraction 34%.  5. Chronic obstructive pulmonary disease with asthma.  6. Lupus.  7. Depression.  8. Elevated troponin most  likely secondary to congestive heart      failure.   PLAN:  1. Recommend continuing IV Lasix.  2. Adenosine Cardiolite in the morning. Given her history of coronary      disease and flash pulmonary edema need to rule out ischemia.  3. Recheck a 2-D echocardiogram to make sure EF is not worsened.      Continue on all her other medications including her Coreg and      Imdur. Will check her office notes to find out why she is not on an      ACE inhibitor or ARD.      [name], M.D.  Electronically Signed     TT/MEDQ  D:  07/08/2007  T:  07/09/2007  Job:  [id number]

## 2011-01-02 NOTE — Consult Note (Addendum)
[name], [name] NO.:  [id number]   MEDICAL RECORD NO.:  [id number]          PATIENT TYPE:  INP   LOCATION:  4732                         FACILITY:  [facility]   PHYSICIAN:  Felipa Evener, MD  DATE OF BIRTH:  13-Mar-1946   DATE OF CONSULTATION:  07/09/2007  DATE OF DISCHARGE:                                 CONSULTATION   IDENTIFICATION:  [name] this time is a 65 year old female with a past  medical history significant for a 47 pack-year of smoking, as well as  lupus with pulmonary involvement, who presents to the hospital today  after waking up from sleep with severe paroxysmal pleural dyspnea that  seems to improve mildly when she is sitting up.  In the ER she was  treated with Lasix and Bi-PAP, with significant improvement in her  respiratory pattern, and now has no symptoms remaining of respiratory  distress.  Upon interviewing the patient, she noted that she had a 4-  pound weight gain over the past two days.  Was unable to walk freely,  with symptoms of dyspnea on exertion, paroxysmal nocturnal dyspnea, and  orthopnea, with cough productive of a white-to-pinkish, frothy  secretions with no evidence of purulence.  No evidence of hemoptysis.  No fever, chills, nausea or vomiting, abdominal pain, or chest pain.  No  mental status changes.  Smoker.  Quit smoking approximately three months  ago.   PAST MEDICAL HISTORY:  Significant for:  1. Congestive heart failure with an ejection fraction of 30%.  2. Coronary artery disease status post myocardial infarction with      stent placement.  3. Diabetes mellitus.  4. Psoriasis.  5. Sjogren syndrome.  6. C lupus.  7. 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  8. Spinal stenosis.  9. Depression.  10.Endometriosis.  11.History of bundle-branch block and left ventricular dysfunction.   MEDICATIONS AT HOME:  1. Aspirin  2. Wellbutrin.  3. Coreg.  4. Cymbalta.  5. Vytorin.  6. Iron.  7. Advair 250/50.  8. Spiriva.  9. Lasix 40 mg b.i.d.  10.NovoLog.  11.Lantus.  12.Imdur.  13.Synthroid.  14.Ativan.  15.Singulair.   ALLERGIES:  1. SULFA.  2. LATEX.  3. NEOMYCIN.  4. CONTRAST MEDIA.   FAMILY HISTORY:  Significant for a father who expired in his 72's due to  coronary artery disease.   SOCIAL HISTORY:  She smoked approximately 3/4 pack of cigarettes (on an  average) for the past 47 years.  Quit three months ago.  No history of  alcohol abuse.  Occupational history is negative.   REVIEW OF SYSTEMS:  A 12-point review of systems is negative, other than  as mentioned in the HPI.   PHYSICAL EXAMINATION:  GENERAL: This is a well-appearing female sitting  comfortably in the bed, in no acute distress.  Alert and oriented x3.  VITAL SIGNS:  She has a temperature of 99.2.  Blood pressure is 98/57.  Heart rate is 104.  Respiratory rate is 18.  Saturation is 91% on room  air.  HEENT:  Normocephalic, atraumatic.  Pupils are  equal, round and reactive  to light.  Extraocular movements are intact.  Oral and nasal mucosae  within normal limits.  NECK:  No thyromegaly.  No ____ QA MARKER: 194 ___         HEART:  Regular rate and rhythm.  Normal S1, S2.  No murmurs, rubs or  gallops appreciated.  LUNGS:  With crackles present in the lower 2/3 of both lungs.  ABDOMEN:  Soft and nontender, nondistended.  Positive bowel sounds.  EXTREMITIES:  No edema.  No tenderness appreciated.  NEUROLOGIC:  Grossly intact.   LABORATORY STUDIES:  White count 9.1, hemoglobin 8.6, hematocrit 25.4,  platelet count of 205, sodium 135, potassium 3.4, chloride 101, bicarb  28, BUN 9, creatinine 1, glucose 100, calcium 8.8.  BNP today, after  diuresis, was 343.  Peak troponin was 0.33.  Chest x-ray showed  bilateral patchy interstitial airway disease consistent with bilateral  pulmonary edema.  Echo showed an old inferior wall MI with no ischemia  noted.  Ejection fraction 33%.   ASSESSMENT  AND PLAN:  Patient is a 65 year old female with a past  medical history of significant congestive heart failure, presenting to  the pulmonary service with shortness of breath that resolved with use of  Bi-PAP and Lasix.  She is currently not on Bi-PAP, and she feels  significantly better.  Now she is able to lie not completely flat, but  much better than upon presentation.  The cough is consistent with  pulmonary edema, as well as a chest x-ray finding result, the fact that  the patient improved with Lasix.  Therefore, at this point I advised the  patient again against smoking.  Would recommend discontinuation.  This  prior week the patient has no respiratory symptoms.  Has no COPD  history.  And discontinuing further Advair, given the history of asthma.  CHF management as per the cardiology service.  We will order full PFTs  and have Dr. Cyril Mourning see her in the morning, as he is her primary  pulmonologist.   Thank you for allowing [place] to participate in this patient's care.  I will  continue to follow with you.     Felipa Evener, MD  Electronically Signed    WJY/MEDQ  D:  07/09/2007  T:  07/09/2007  Job:  [id number]

## 2011-01-02 NOTE — Assessment & Plan Note (Signed)
HEALTHCARE                             PULMONARY OFFICE NOTE   [name], [name]                       MRN:          [id number]  DATE:05/01/2007                            DOB:          1946-06-01    The patient is a 65 year old Caucasian smoker with interstitial lung  disease.  Her PFTs have suggested predominantly intraparenchymal  restriction.  She does have some LV dysfunction and an EF in 2005  documented as 45-50%.  She had an episode of respiratory failure in 2001  with bilateral infiltrates in the setting of positive ANA which was  diagnosed as lupus pneumonitis.  However, after speaking to Dr.  Corliss Skains today, it seems that she does have a positive ANA 1:1280,  positive RO antibody which is consistent with Sjogren's syndrome (the  patient also states that this was diagnosed at [facility]) and psoriatic  arthritis for which she was on methotrexate, however, she has self-  discontinued this six months ago because she felt that this was  suppressing her immune system.   She saw Dr. Shelle Iron a couple of weeks ago for severe nasal congestion and  hemoptysis.  CT of the sinuses was negative.  She took [place] and the  hemoptysis seems to have subsided.  However, her dyspnea worsened which  prompted an ER visit.  Labs showed that the hemoglobin had decreased to  10.4, white count 12.5, platelets 213.  BUN and creatinine was 10 and  1.08.  Cardiac markers were negative.  BNP level was 228.  Chest x-ray  showed worsening bilateral multifocal air space opacities with  cardiomegaly.  She was asked to increase her Lasix and given a few days  of prednisone with some improvement.  She has quit smoking for about a  week now.   PHYSICAL EXAMINATION:  VITAL SIGNS:  Weight 205 pounds, temperature  97.8, blood pressure 114/78, heart rate 96 per minute, oxygen saturation  96% on room air.  HEENT:  No post nasal drip.  CHEST:  Clear to auscultation.  CARDIOVASCULAR:  S1 and S2 normal.  ABDOMEN:  Soft, nontender.  EXTREMITIES:  No edema.   CURRENT MEDICATIONS:  1. Coreg 3.125 mg daily.  2. Furosemide 40 mg b.i.d.  3. Potassium.  4. Isosorbide 30 mg daily.  5. Cymbalta 60 mg daily.  6. Synthroid 75 mcg daily.  7. Vytorin 10/40 mg daily.  8. Aspirin 81 mg daily.  9. Bupropion XL 140 mg daily.  10.Singulair 10 mg daily.  11.Iron and vitamin D.  12.Lorazepam 1 mg p.r.n.  13.Hydrocodone 5/500 mg daily.  14.Nitroglycerin 0.4 mg sublingual p.r.n.  15.Advair 250/50 one puff b.i.d.   IMPRESSION:  1. Interstitial lung disease in the setting of autoimmune disease.  2. Highly positive ANA 1:1280.  3. Sjogren's syndrome.  4. Psoriatic arthritis.  5. Left ventricular dysfunction.   She does not seem to be in overt heart failure at present.  The  hemoptysis and worsening infiltrates are concerning for diffuse alveolar  hemorrhage.  I obtained a stat diffusion capacity in the office and this  seems to be  decreased at 54%.  This argues against diffuse alveolar  hemorrhage and goes more in favor of interstitial lung disease and/or  pneumonia.  She has already received two rounds of antibiotics and seems  to be stable enough not to require hospital admission.   RECOMMENDATIONS:  1. We will obtain further serology including ANCA, double stranded DNA      and ENA.  2. I have given her prednisone 40 mg to take for three days and then      decrease by 10 mg every week.  3. She will make an appointment to see Dr. Corliss Skains again in about      two weeks' time and I will let her decide whether to continue the      steroids or to put her on methotrexate.   She will return for a follow-up in three weeks.  If the infiltrates are  persistent or if her hemoptysis recurs, we will consider a bronchoscopy  with biopsies.     Oretha Milch, MD  Electronically Signed    RVA/MedQ  DD: 05/01/2007  DT: 05/02/2007  Job #: [id number]   cc:   Kathryne Hitch, MD  [name], M.D.

## 2011-01-02 NOTE — Discharge Summary (Signed)
[name], [name]                ACCOUNT NO.:  [id number]   MEDICAL RECORD NO.:  [id number]          PATIENT TYPE:  INP   LOCATION:  5730                         FACILITY:  [facility]   PHYSICIAN:  Barnetta Chapel, MDDATE OF BIRTH:  08-07-1946   DATE OF ADMISSION:  05/17/2007  DATE OF DISCHARGE:  05/20/2007                               DISCHARGE SUMMARY   PRIMARY CARE PHYSICIAN:  Royetta Crochet, MD.   DISCHARGE DIAGNOSES:  1. Uncontrolled diabetes mellitus.  2. Altered mental status.  3. Hyperkalemia.  4. Hypokalemia.  5. Acute renal failure, resolved.  6. Dehydration, resolved.  7. Fatigue.  8. Depression.   DISCHARGE MEDICATIONS:  1. Subcutaneous Lantus insulin 20 units every morning.  2. Metformin 1000 mg p.o. twice daily.  3. Vytorin 10/40 one tablet p.o. once daily.  4. Advair Diskus to be used twice daily.  5. Coreg 3.125 mg p.o. b.i.d.  6. Isosorbide mononitrate 30 mg p.o. once daily.  7. Cymbalta 60 mg once daily.  8. Synthroid 75 mcg p.o. once daily.  9. Aspirin 81 mg p.o. once daily.  10.Lorazepam 1 mg p.o. nightly.  11.Wellbutrin XL 150 mg p.o. once daily.  12.Sublingual nitroglycerin p.r.n.  13.Hydrocodone/APAP 5/500 p.r.n.  14.Singulair 10 mg p.o. once daily.  15.Iron pills p.o. once daily.  16.Vitamin D once daily.   CONSULTATIONS:  None.   PERTINENT LABORATORY DATA ON ADMISSION:  The patient's blood sugar was  too high and could not be read.  White count 10.1 on admission.  Creatinine was 1.4 on admission with a BUN of 26.  Sodium was 106 on  admission.  Potassium was 5.6 on admission.   BRIEF HISTORY AND HOSPITAL COURSE:  Please refer to the H&P done on  May 17, 2007.  The patient is a 65 year old female with past  medical history significant for diabetes mellitus, endometriosis,  hypertension, pneumonia, MI, congestive heart failure with diastolic  dysfunction, asthma, bronchitis, coronary artery disease, left bundle  branch block,  depression, psoriatic atrophy and dyslipidemia.  The  patient was admitted with significant fatigue, weakness, altered  mental  status, and very high blood sugar.   The patient was admitted to the step-down unit.  The patient was  adequately rehydrated.  With adequate rehydration, the acute renal  failure and the dehydration resolved.  The blood sugar was managed with  [place].  With adequate control of the blood sugar, the patient's  potassium became low.  The patient was also pan cultured and started on  prophylactic antibiotics.  With above management, the patient improved  significantly.  The altered mental status resolved.  The acute renal  failure and dehydration also resolved.  The blood sugar was controlled  controlled with above regimen.  During the patient's hospital stay, she  had diabetic teaching.  The patient has been advised to check her blood  sugar 4 times daily for the next one week and discuss the readings with  the primary care [name]'s office.   DISCHARGE PLAN:  1. Discharge patient home today.  2. Check blood sugar 4 times a day and take  the record to the primary      care [name]'s office.  3. Signs of hypoglycemia were extensively discussed with the patient.  4. Follow up with the primary care [name] in a week.  5. ADA diet, 1800 calorie.  6. Activity as tolerated.      Barnetta Chapel, MD  Electronically Signed     SIO/MEDQ  D:  05/20/2007  T:  05/20/2007  Job:  [id number]   cc:   Royetta Crochet, MD  Holley Bouche, M.D.

## 2011-01-02 NOTE — Discharge Summary (Signed)
Diamond, [name]                ACCOUNT NO.:  [id number]   MEDICAL RECORD NO.:  [id number]          PATIENT TYPE:  INP   LOCATION:  2003                         FACILITY:  [facility]   PHYSICIAN:  Oretha Milch, MD      DATE OF BIRTH:  [birth year]   DATE OF ADMISSION:  07/08/2007  DATE OF DISCHARGE:  07/23/2007                               DISCHARGE SUMMARY   DISCHARGE DIAGNOSES:  1. Vent-dependent respiratory failure secondary to acute respiratory      distress syndrome.  2. Diabetes mellitus.  3. Congestive heart failure.  4. Chronic anemia.  5. History of psoriatic arthritis.  6. Vent-dependent respiratory failure.  7. Anxiety and depression.   HISTORY OF PRESENT ILLNESS:  [name] is a 65 year old female  with known congestive heart failure with EF documented at 30%, also with  diabetes mellitus, coronary artery disease status post stent, Sjogren's  disease_  psoriatic arthritis, tobacco abuse, chronic obstructive  pulmonary disease. She initially was admitted to the hospital on  July 08, 2007 per [facility] hospitalist. She subsequently developed  increasing respiratory failure and required transfer to intensive care  unit where she required oral tracheal intubation for respiratory failure  with suspected volume overload but with radiographic picture suspicious  for ARDS.   LABORATORY DATA:  Arterial blood gas pH 7.51, pCO2 of 28, PO2 of 58.5.  Bicarb was 23 on room air. Hemoglobin 10.2, hematocrit 30.4, WBC is  15.6, platelets of 254, MCV is 89.6. Sodium 139, potassium 3.4, chloride  103, CO2 is 29, BUN is 18, creatinine 1.16, glucose is 82. Troponin I is  0.03, CK is 34, CK-MB is 12.9. Calcium is 8.3, BNP is 317, ferritin  level is 662. Vitamin B12 is 687. __________  oximetry demonstrates  oxygen saturation of 68.1.   RADIOGRAPHIC DATA:  Chest x-ray demonstrates the left internal jugular  central venous line and NG tube have been removed, persistent  cardiomegaly  with mild congestive edema pattern bilateral, hazy  atelectasis, infiltrates in the right lower lobe.   PROCEDURE:  Oral tracheal intubation November 21 with subsequent removal  November 22. Reintubation November 23 with removal on November 28.  November 21, she had a left IJ CVL placed with subsequent removal. On  November 21, she had a right arterial catheter line placed and was  subsequently removed on November 27.   HOSPITAL COURSE BY DISCHARGE DIAGNOSIS:  1. Vent-dependent respiratory failure secondary to acute respiratory      distress syndrome and volume overload and congestive heart failure.      She was moved to the intensive care unit and had required oral      tracheal intubation with mechanical ventilatory support.      Subsequently was liberated from mechanical ventilatory support      after aggressive diuresis. She had cardiology called in to consult.      Continued to help with her congestive heart failure. She does have      underlying COPD. She has continued to be a smoker. She will be      maintained on  her outpatient medication regimen for her COPD.  2. Congestive heart failure. She has a known EF of 30%. She also had a      2-D echo performed on July 22, 2007, results are not available      at this time. This will be followed by Dr. [name] on an      outpatient basis. Her medications have been adjusted per      cardiology.  3. Chronic anemia which has been monitored and is stable at this time.  4. History of psoriasis and arthritis. She is to followup with Dr.      Corliss Skains, her rheumatologist.  5. Anxiety and depression with known panic attacks. She has      benzodiazepines ordered for this. This will be taken as instructed.   DISCHARGE MEDICATIONS:  1. Aspirin 81 mg a day.  2. New medicine is prednisone 10 mg 1 a day until gone for 3 days.  3. Vytorin 10/40 one a day.  4. Synthroid 75 mcg one a day.  5. Cymbalta 60 mg a day.  6. Combivent 2 puffs 4  times a day.  7. Coreg 6.25 mg 1 tab 2 times a day. Note this is increased dose.  8. Colace 100 mg 2 times a day.  9. Hydralazine 25 mg 4 times a day. Note this is a new medication.  10.Imdur 3 mg  once a day.  11.Lasix 40 mg 2 times a day.  12.Kay Ciel 20 mg 2 times a day.  13.Lantus 10 units 2 times a day, decreased dose from her home dose      and her Metformin left off at this time since she has been      hypoglycemic while in the hospital.   DIET:  Heart healthy, low sodium, no concentrate sweets.   She has a followup appointment with Dr. Vassie Loll on September 09, 2006 at 12  noon. She has got a followup appointment with Dr. Holley Bouche on  July 29, 2007 at 10:45 a.m. She has been instructed to make a  followup appointment with Dr. Jacqualine Code hospitalist and Dr.  Ewing Schlein of GI services.   DISPOSITION/CONDITION ON DISCHARGE:  Improved. She is being discharged  home in improved condition.      Devra Dopp, MSN, ACNP      Oretha Milch, MD  Electronically Signed    SM/MEDQ  D:  07/23/2007  T:  07/23/2007  Job:  [id number]   cc:   Melida Quitter, M.D.  [name], M.D.  Kathryne Hitch, MD

## 2011-01-02 NOTE — Consult Note (Signed)
NAMEDEZTINY, SARRA NO.:  [id number]   MEDICAL RECORD NO.:  [id number]          PATIENT TYPE:  INP   LOCATION:  4732                         FACILITY:  [facility]   PHYSICIAN:  Petra Kuba, M.D.    DATE OF BIRTH:  [birth year]   DATE OF CONSULTATION:  07/09/2007  DATE OF DISCHARGE:                                 CONSULTATION   We were asked to see Ms. Tonelli today in consultation for weight loss and  dysphagia by Dr. Arthor Captain of [facility].   HISTORY OF PRESENT ILLNESS:  This is a 65 year old female who on  November 18 experienced severe shortness of breath with flash pulmonary  edema.  She was admitted and is being treated by the [facility]  and Cardiology for those symptoms.  However, in the course of history  taking, Dr. Arthor Captain discovered that the patient has been having dysphagia  for the last month and has lost approximately 16 pounds in the last 8 to  10 weeks.  She tells me that she has increasing nausea and anorexia as  well as abdominal bloating and gas.  Further, she states she has been  anemic almost all of her life, as has her mother.  She tells me that  when she eats mostly white foods, meaning potatoes and bread, things  like that, that they get stuck; and she points to her clavicle notch.  Sometimes she says she has to spit them back out.  This has happened to  her when she took several pills at one time as well.  She denies any  hematemesis or change in bowel habits.  She states that her bowel habits  are every other day and she uses a daily stool softener.  She denies any  heavy NSAID use, heartburn, or other abdominal pain.  She tells me that  she has not had a chance to have her dysphagia evaluated by her primary  care physician yet.  She has never had a colonoscopy or an endoscopy.  Her primary care physician is Dr. Holley Bouche.   PAST MEDICAL HISTORY:  Significant for:  1. Congestive heart failure with an ejection fraction of 30  to 40%.  2. Diabetes mellitus.  3. Hypothyroidism.  4. COPD.  5. Tobacco use.  6. Lupus with arthritis.  7. Psoriasis.  8. Depression.  9. Left bundle branch block.  10.Anemia.   MEDICATIONS:  Per chart, but also include 81-mg aspirin as well as iron  and no anticoagulation.   SHE HAS ALLERGIES TO SULFA.   SOCIAL HISTORY:  Positive for tobacco; however, it is negative for drugs  and alcohol.  She is a retired Airline pilot.   FAMILY HISTORY:  Negative for colon cancer and peptic ulcer disease, but  she does say that her mother, father, and sister have all had esophageal  dilatations.   REVIEW OF SYSTEMS:  Negative other than the HPI.   CURRENT LABS:  Show a hemoglobin of 8.6, hematocrit 25.4, white count  9.1, platelets 205,000.  BMET shows a potassium of 3.4, BUN of 9,  creatinine 1.0.  BNP is currently 343.   PHYSICAL EXAM:  GENERAL:  She is alert and oriented in no apparent  distress.  VITAL SIGNS:  Her temperature is 99.2, pulse is 104, respirations are  18, blood pressure is 98/57.  ABDOMEN:  Soft, nondistended, nontender with positive bowel sounds.   RECENT DIAGNOSTICS:  Include a nuclear medicine stress test on which  they found no acute MI.   ASSESSMENT:  Dr. Vida Rigger has seen and examined the patient, collected  a history, and reviewed her chart.  He states that this is a 65 year old  female with current pulmonary edema, history of congestive heart failure  and chronic obstructive pulmonary disease who has been experiencing  dysphagia, solids more than liquids, accompanied by nausea for the past  1 to 2 months.  We will begin our GI evaluation with a barium swallow  with a 13-mm tablet to evaluate for stricture.  Pending that, the  patient may need an upper endoscopy.  However, this can be done as an  outpatient.   Thanks very much for this consultation.      Stephani Police, PA    ______________________________  Petra Kuba, M.D.    MLY/MEDQ  D:   07/09/2007  T:  07/10/2007  Job:  [id number]   cc:   Holley Bouche, M.D.  Michelene Gardener, MD  Petra Kuba, M.D.

## 2011-01-02 NOTE — Assessment & Plan Note (Signed)
[facility]                             PULMONARY OFFICE NOTE   Diamond, [name]                       MRN:          [id number]  DATE:04/04/2007                            DOB:          01-Feb-1946    [name] is a 65 year old Caucasian smoker who presents for an  evaluation of dyspnea.  She had an episode of respiratory failure in  2001 with bilateral infiltrates in the setting of positive ANA, which  was diagnosed as lupus pneumonitis.  Apparently, BAL cytology showed  helper to supressor cell ratio of 1:2, meaning that there was a  decreased number of helper cells present.  I am unsure of the  significance of this, and it seems that her rheumatologist, Dr. Coral Spikes,  was not convinced then that she had systemic lupus.  At any rate, she  improved, and seems to have responded to steroids.  Since then, she has  started smoking again.  PFTs in April 2005 showed an FEV1/FVC ratio of  80 suggesting no evidence of airway obstruction.  FEV1 was decreased at  73%, FVC was decreased at 68%.  TLC was 72% with DLCO of 61% suggesting  intraparenchymal restriction likely on the basis of interstitial lung  disease.  I do note subsequent x-rays last in April 2008 showing some  cardiomegaly with interstitial infiltrates.  In January 2008, she was  hospitalized for pneumonia, and the x-ray does show bilateral  infiltrates.   Her current problem is dyspnea on exertion, which is especially worse  when she is dancing or doing any housework.  She denies wheezing, and  has minimal white phlegm production.  A few days ago after a bout of  coughing, she had blood-tinged sputum, but this has not recurred since  then.   PAST MEDICAL HISTORY:  Includes:  1. MI and some LV dysfunction.  She follows with Dr. [name].  EF      in 2005 was documented as 45% to 50%.  2. Depression.  3. Asthma.  4. I do note a diagnosis of Sjogren's syndrome has also been  entertained.   PAST SURGICAL HISTORY:  Includes:  1. Hysterectomy at 65 years of age.  2. Double mastectomy in 1982.  3. Angioplasty in 2002.   ALLERGIES:  SULFA.   CURRENT MEDICATIONS:  Include:  1. Carvedilol 3.125 mg b.i.d.  2. Furosemide 40 mg 2 tablets daily.  3. Isosorbide mononitrate 30 mg daily.  4. Potassium 20 mEq daily.  5. Cymbalta 60 mg daily.  6. Synthroid 35 mcg daily.  7. Vytorin 10/40 mg daily.  8. Aspirin 81 mg daily.  9. Bupropion XL 150 mg daily.  10.Lorazepam 1 mg as needed.  11.NitroQuick 0.4 mg as needed.  12.Hydrocodone/APAP 5/500 mg q.8 hours as needed.  13.Singulair 10 mg daily.  14.Advair 250/50 one puff b.i.d.  15.Spiriva HandiHaler daily.  16.__________ vitamin D.   SOCIAL HISTORY:  She smokes currently about 1/2 pack per day.  She quit  after her episode of respiratory failure in 2001, but started smoking  again in 2004.  She has  been smoking for the last 46 years.  She drinks  2 to 3 drinks a week.  She is divorced, and lives by herself.  She is a  retired Environmental health practitioner.   FAMILY HISTORY:  Asthma in her mother.  Rheumatism in her paternal  grandparents.   REVIEW OF SYSTEMS:  Includes loss of appetite, occasional headaches,  nasal congestion, and sneezing, joint stiffness and pain.   PHYSICAL EXAMINATION:  Weight 210 pounds.  Temperature 97.8.  Blood  pressure 118/68.  Heart rate 92 per minute.  Oxygen saturation 96% on  room air.  HEENT:  No postnasal drip.  NECK:  Supple.  No JVD.  No lymphadenopathy.  CVS:  S1 and S2 normal.  CHEST:  Decreased breath sounds both bases.  No crackles.  No rhonchi.  ABDOMEN:  Soft and nontender.  NEUROLOGIC:  Nonfocal.  EXTREMITIES:  No edema.   IMPRESSION:  1. [name] may have interstitial lung disease based on the presence      of predominant intraparenchymal restriction on her pulmonary      function tests.  This may correlate with some interstitial      infiltrates on her chest x-ray.   Whether this is related to left      ventricular dysfunction and pulmonary edema, or scarring from her      prior episode of lupus pneumonitis, or some other form of      interstitial lung disease is unclear to me.  2. Active smoker.  Although, no obstruction was noted on spirometry in      April 2005.  3. Deconditioning.  4. Left ventricular dysfunction.  5. Sleep onset insomnia is described by the patient.   RECOMMENDATIONS:  1. Chest x-ray and PFTs will be obtained.  If these are consistent      again with intraparenchymal restriction, I may consider doing a      high-resolution CT of the chest to further characterize the      fibrosis in the lungs.  2. We will also look for airway obstruction related to her smoking.  3. Spiriva seemed to offer symptomatic benefit, and I have asked her      to resume this.  We will hold her Advair for now, and she will use      nebulizers on a p.r.n. basis only.  4. I think she would definitely benefit from pulmonary rehab, and I      will have her start it with at least 3 sessions a week with aerobic      exercise and breathing retraining.  This will hopefully also      motivate her for smoking cessation.  5. Smoking cessation was discussed in detail.  She will try to      decrease by 1 cigarette a week.  6. Hopefully, with increase in her exercise level, her insomnia will      also be better.  I have also asked her to refrain from caffeine      containing drinks in the 2nd half of the day.     Oretha Milch, MD  Electronically Signed    RVA/MedQ  DD: 04/04/2007  DT: 04/05/2007  Job #: [id number]

## 2011-01-02 NOTE — H&P (Signed)
[name], [name]                ACCOUNT NO.:  [id number]   MEDICAL RECORD NO.:  [id number]          PATIENT TYPE:  INP   LOCATION:  4732                         FACILITY:  [facility]   PHYSICIAN:  Hollice Espy, M.D.DATE OF BIRTH:  1946/03/11   DATE OF ADMISSION:  07/08/2007  DATE OF DISCHARGE:                              HISTORY & PHYSICAL   PRIMARY CARE PHYSICIAN:  Holley Bouche, M.D.   CHIEF COMPLAINT:  Shortness of breath.   HISTORY OF PRESENT ILLNESS:  The patient is a 65 year old, white female  with past medical history of CHF with ejection fraction of 30-40% as  well as diabetes mellitus and hypothyroidism who has been in previously  good health, last on the hospitalist service about 6 weeks ago for  elevated blood sugars, but she cannot recall the last time she had to be  hospitalized for congestive heart failure.  She was doing well and  today.  She has had no recent fevers, chills and no cough, but then all  of a sudden around midnight, she was having sudden onset of shortness of  breath and went to flash pulmonary edema.  She could not catch her  breath.  Paramedics were called and the patient was brought into the  emergency room.  Chest x-ray showed signs of bilateral air space disease  more consistent with pulmonary edema than with infiltrate.  Labs were  ordered on the patient and she was found to have a normal set of cardiac  markers.  Her white count was found to be elevated at 12.6 with a shift.  A BNP was ordered and is currently pending.  The patient was given 40 mg  of IV Lasix and put on BiPAP.  Already, she says her breathing is much  improved.  She currently is doing well.  She denies any headache or  vision changes, no dysphagia.  She has not had any episodes of chest  pain or palpitations.  She says her breathing is much easier.  She has  not really noted too much of a cough, no wheezing, abdominal pain,  hematuria, dysuria, constipation, diarrhea,  focal extremity weakness,  numbness or pain.  She did note some lower extremity swelling earlier  which has improved.  Review of systems otherwise negative.   PAST MEDICAL HISTORY:  1. CHF with ejection fraction 35-40%.  2. Ongoing tobacco use.  3. COPD.  4. History of CAD, status post MI and stent placement.  5. Diabetes type 2.  6. Lupus and secondary pneumonitis.  7. Psoriasis.  8. History of spinal stenosis.  9. Depression.  10.History of old left bundle branch block.   MEDICATIONS:  (Based on her last discharge summary from May 20, 2007)  1. Lantus 20 subcu daily.  2. Metformin 1000 mg p.o. b.i.d.  3. Vytorin 10/40 p.o. daily.  4. Advair Diskus 250/50 one puff b.i.d.  5. Coreg 3.125 mg b.i.d.  6. Imdur 30 p.o. daily.  7. Cymbalta 60 p.o. daily.  8. Synthroid 75 mcg daily.  9. Aspirin 81 mg p.o. daily.  10.Lorazepam 1 p.o. nightly.  11.Wellbutrin 150 p.o. daily.  12.Sublingual nitroglycerin p.r.n.  13.Vicodin p.r.n.  14.Vicodin p.r.n.  15.Singulair 10 p.o. daily.  16.Iron pills p.o. daily.  17.Vitamin D p.o. daily.   ALLERGIES:  SULFA.   SOCIAL HISTORY:  She denies any alcohol or drug use.  She does smoke and  is trying to quit.  She says she has only smoked about a pack a week.   FAMILY HISTORY:  Noncontributory.   PHYSICAL EXAMINATION:  VITAL SIGNS:  Temperature 97.4, heart rate 97,  blood pressure 169/78, respirations 24, O2 saturations 99% on BiPAP  10/5.  GENERAL:  She is alert and oriented x3.  She is wearing the BiPAP mask,  but in no apparent distress.  HEENT:  Normocephalic, atraumatic.  Mucous membranes are slightly dry.  She has no carotid bruits.  HEART:  Regular rate and rhythm with a 3/6 systolic ejection murmur.  LUNGS:  Scattered rales with decreased breath sounds in the bases, left  greater than right.  ABDOMEN:  Soft, obese, nontender, positive bowel sounds.  EXTREMITIES:  No clubbing, cyanosis or edema.  Less than a 1+ pitting   edema bilaterally from the bilateral knees down.   LABORATORY DATA AND X-RAY FINDINGS:  BNP is 286.  White count 12.6, H&H  9.4 and 28, MCV 91, platelet count 254, 88% shift.  Sodium 140,  potassium 3.5, chloride 103, bicarb 24, BUN 10, creatinine 1.1, glucose  147.  CPK 85.8, MB 1.4, troponin I less than 0.05.   Chest x-ray as per HPI.  EKG shows left bundle branch block and no  further interpretation is available.   ASSESSMENT/PLAN:  1. Congestive heart failure exacerbation.  Will continue her Coreg and      other labs.  Check a TSH plus serial cardiac markers.  Her last two-      dimensional echocardiogram was in February 2006, so we will check a      repeat for Dr. [name] to read.  Check daily weights and      continue to follow congestive heart failure protocol.  2. Diabetes mellitus, currently stable.  I am not going to continue      her metformin given her active congestive heart failure and likely      this needs to be discontinued permanently.  3. Tobacco abuse.  The patient has declined nicotine patch and says      she is trying to quit.  4. Chronic obstructive pulmonary disease, stable.  5. Leukocytosis.  Likely stress related.  I see no evidence in her      history of present illness to indicate that she has had a      respiratory illness.  If, however, once she has been diuresed and a      follow up chest x-ray shows a worsening infiltrate or an increase      in white count, at that point would consider starting antibiotic      treatment.      Hollice Espy, M.D.  Electronically Signed     SKK/MEDQ  D:  07/08/2007  T:  07/08/2007  Job:  [id number]   cc:   Holley Bouche, M.D.  [name], M.D.

## 2011-01-02 NOTE — H&P (Signed)
NAMECATORI, PANOZZO                ACCOUNT NO.:  [id number]   MEDICAL RECORD NO.:  [id number]          PATIENT TYPE:  INP   LOCATION:  4709                         FACILITY:  [facility]   PHYSICIAN:  Corinna L. Lendell Caprice, MDDATE OF BIRTH:  Jun 01, 1946   DATE OF ADMISSION:  05/20/2008  DATE OF DISCHARGE:                              HISTORY & PHYSICAL   CHIEF COMPLAINT:  Abnormal V/Q scan and shortness of breath.   HISTORY OF PRESENT ILLNESS:  [name] is a pleasant 65 year old white  female with multiple medical problems including congestive heart failure  and chronic renal insufficiency, who was sent to the emergency room by  Dr. Mayford Knife, who ordered an outpatient VQ scan.  The patient has had  several months' worth of worsening shortness of breath.  Her cardiac  medications have been adjusted with no improvement in her symptoms.  She  has had no chest pain.  She had a V/Q scan done today, which showed  single, moderate unmatched perfusion defect in the lingula, intermediate  probability.  The patient has no previous history of thromboembolism.  She recently quit smoking.  She has a history of lupus and rheumatoid  arthritis.  She was unable to get a CT angiogram due to her renal  insufficiency.   PAST MEDICAL HISTORY:  1. Congestive heart failure with an ejection fraction of 20%.  2. Biventricular pacemaker.  3. Tobacco abuse, recently quit.  4. History of COPD.  5. Lupus.  6. Rheumatoid arthritis.  7. Coronary artery disease.  8. Diabetes.  9. Severe mitral regurgitation  10.Chronic renal insufficiency.  11.Hypothyroidism.  12.Depression.   MEDICATIONS:  1. Advair 100/50 one puff b.i.d.  2. Lasix 80 mg in the morning, 40 mg in the evening.  3. Potassium twice a day.  4. Imdur 30 mg a day.  5. Cymbalta 90 mg a day.  6. Synthroid 75 mcg a day.  7. Vytorin 10/40 mg a day.  8. Aspirin 325 mg a day.  9. Lorazepam 1 mg nightly.  10.Budeprion XL 150 mg a day.  11.Nitroglycerin  as needed.  12.Singulair 10 mg a day.  13.Iron daily.  14.Vitamin D daily.  15.Promethazine as needed.  16.Colace as needed.  17.Lomotil as needed.  18.Vicodin as needed.   She is allergic to SULFA and LATEX.   SOCIAL HISTORY:  The patient lives alone.  No heavy alcohol use.  No  drug use.  Recently quit smoking.   Family history is negative for clotting disorders.   REVIEW OF SYSTEMS:  As above, otherwise negative.   PHYSICAL EXAMINATION:  VITAL SIGNS:  Temperature is 96.9, pulse 90,  respiratory rate 20, blood pressure 107/66, oxygen saturation 99% on  room air. GENERAL:  The patient is an overweight white female in no  acute distress.  HEENT:  Normocephalic, atraumatic.  Pupils equal, round, and reactive to  light.  Sclerae nonicteric.  Moist mucous membranes.  NECK:  Supple.  No  JVD.  LUNGS:  Clear to auscultation bilaterally without wheezes, rhonchi, or  rales.  CARDIOVASCULAR:  Regular rate and rhythm without murmurs, gallops,  or  rubs.  ABDOMEN:  Normal bowel sounds, soft, nontender, nondistended.  GU AND RECTAL:  Deferred.  EXTREMITIES:  No edema.  No calf tenderness.  Positive bilateral Homans  sign.  Pulses are intact.  NEUROLOGIC:  Alert and oriented.  Cranial nerves and sensorimotor exam  are intact.  SKIN:  No rash.   LABORATORY DATA:  None.  V/Q scan as above.   ASSESSMENT AND PLAN:  1. Probable pulmonary embolus:  I will get Dopplers of the legs, check      a D-dimer, start Coumadin and Lovenox.  Certainly, she is at risk      for pulmonary embolus and clinical suspicion is rather high.  She      is unable to get any contrast studies due to her creatinine.  She      will be on bedrest.  I will continue the rest of her outpatient      medications and place her on telemetry for now.  2. Congestive heart failure secondary to systolic dysfunction,      compensated.  3. Chronic obstructive pulmonary disease versus asthma.  4. Hypertension.  5. Diabetes.   6. Coronary artery disease.  7. Lupus.  8. Rheumatoid arthritis.  9. Previous history of smoking.  10.Chronic renal insufficiency.  11.Hyperlipidemia.  12.Depression.   She will get hypercoagulable workup as well as baseline labs.      Corinna L. Lendell Caprice, MD  Electronically Signed     CLS/MEDQ  D:  05/21/2008  T:  05/22/2008  Job:  [id number]   cc:   [name], M.D.  Holley Bouche, M.D.  Kathryne Hitch, MD

## 2011-01-05 NOTE — Discharge Summary (Signed)
[name], PERFECTO                          ACCOUNT NO.:  [id number]   MEDICAL RECORD NO.:  [id number]                   PATIENT TYPE:  INP   LOCATION:  2035                                 FACILITY:  [facility]   PHYSICIAN:  Adrian Saran, N.P.                  DATE OF BIRTH:  July 01, 1946   DATE OF ADMISSION:  10/10/2003  DATE OF DISCHARGE:  10/16/2003                                 DISCHARGE SUMMARY   ADMISSION DIAGNOSES:  1. Chest pain, acute non-subendocardial myocardial infarction.  2. Congestive heart failure.  3. History of coronary artery disease, status post stent to the right     coronary artery in March 2002.  4. Left bundle branch block.  5. Renal insufficiency.  6. Diabetes.   DISCHARGE DIAGNOSES:  1. Coronary artery disease, status post non-subendocardial myocardial     infarction     a. Status post stent to the right coronary artery in March 2002.     b. Non-obstructive coronary artery disease to the left anterior        descending coronary artery and ramus intermediate by cardiac        catheterization on October 14, 2003.  2. Acute bronchitis.  3. Left lower pelvic quadrant cyst.  4. Hyperlipidemia.  5. Insulin-dependent diabetes, controlled.  6. Leukocytosis, secondary to steroids.  7. Renal insufficiency.   PROCEDURE:  Left heart catheterization on October 14, 2003.   CONSULTATIONS:  None.   DISCHARGE STATUS:  Stable, improved.   HISTORY OF PRESENT ILLNESS:  Please see the complete H&P  for details, but  in short, this is a 65 year old female who was brought to the emergency room  by EMS on October 10, 2003, with the complaint of shortness of breath that  was progressive, associated with some chest discomfort.  She denied any  associated nausea or diaphoresis.  She was unsure as to this discomfort,  being similar to pain she had experienced previously with her inferior  myocardial infarction back in 2002.  She did not take any nitroglycerin.  EMS was  called.  Upon their arrival, they found her to be in acute pulmonary  edema, and she was given IV Lasix en route to the emergency room.  Upon  arrival to the emergency room she had no further chest discomfort.   PHYSICAL EXAMINATION:  VITAL SIGNS:  Blood pressure 97/67, heart rate 97,  respirations 22, O2 saturation 97% on 2 L.  GENERAL:  She was alert and oriented, in mild respiratory distress.  SKIN:  Warm and dry.  NECK:  There was no notable jugular venous distention.  LUNGS:  Revealed bibasilar rales approximately one-third of the way up, as  well as some mild expiratory wheezes scattered bilaterally.  HEART:  A regular rate and rhythm.  ABDOMEN:  Benign with normal bowel sounds.  EXTREMITIES:  She had no peripheral edema.  A chest x-ray showed  mild interstitial edema.  An electrocardiogram showed a sinus rhythm with a left bundle branch block.   LABORATORY DATA:  Admission CBC and BMP were okay, with the exception of  azotemia with her serum creatinine at 2.3.  Cardiac enzymes revealed a CK-MB  of 17.6, myoglobin greater than 500, and a troponin of 0.10.   HOSPITAL COURSE:  She was admitted for further treatment and evaluation.  Serial cardiac enzymes.  She was continued on her aspirin as well as IV  Lasix.  She was started on IV nitroglycerin, IV heparin and IV Integrilin.  A beta blocker was also initiated.  The next day she was breathing easier, but still complaining of upper  anterior chest discomfort, especially when she takes a deep breath.  The O2  saturations were 94% on O2.  Her vital signs were within normal limits.  The  beta natriuretic peptide was notably elevated at 2260.  Her troponin peaked  at 0.15.  She had 213 CK with 9.5 MB.  Because of relatively low blood  pressure, she was started on IV dopamine.  The dopamine was actually  discontinued the next day, secondary to the normalization of her blood  pressure.  She was noted to have leukocytosis at 16.5.  Blood  cultures and  urine culture were obtained.  It was suspected at this point that she also  was having an episode of acute bronchitis.  She was started on Tequin at  this point.  Fluids would be restricted, secondary to hyponatremia at 126.  A 2-D echocardiogram was also ordered, to re-evaluate the LV function.  The  2-D echocardiogram was performed on October 11, 2003.  The results showed  overall no decrease in  LV function, with an ejection fraction of 45%.  Some  mild diffuse LV hypokinesis was also noted.  Doppler measurements were also  consistent with abnormal LV relaxation.  She had trivial AR with a mean  valve gradient of 10 mmHg.  Otherwise the echocardiogram was well within  normal limits.  She was continued on IV heparin, IV Integrilin and IV  nitroglycerin.  By day two she was feeling better; however, still complaining of significant  cough.  She had no further complaints of chest discomfort.  Her renal  insufficiency had improved with serum creatinine by October 12, 2003, at  1.4.  White count was now elevated to 25,000.  Blood sugars were also  notably elevated at 172.  A sliding scale coverage was initiated.  She went  for a repeat chest x-ray on October 12, 2003.  A pulmonary consultation was  obtained, secondary to improvement in lung sounds with no elevation in  crackles, but the development of rhonchi.  It was felt that she had acute  bronchitis.  Avelox was started.  The Integrilin was discontinued at this  point.  IV nitroglycerin was weaned.  Plans for a cardiac catheterization  were begun.  On October 12, 2003, Dr. Oley Balm. Simonds saw the patient in consultation.  He recommended bronchodilators, nebulized steroids and to complete a seven-  day-course of Avelox.  He also counseled her at length in regards to smoking  cessation.  On October 13, 2003, the patient began complaining of severe left lower quadrant pain.  She had a normal bowel movement the day  before.  Her vital  signs were stable.  She was afebrile.  The physical examination was  essentially unchanged.  The abdomen was soft with normal bowel sounds.  Her  white counts had improved.  IV heparin was discontinued at this point, and  her IV fluids were discontinued.  She was started on Altace, secondary to LV  dysfunction. Her potassium was noted to be at the lower limits of normal at  3.6.  She was given some mild p.o. replacement.  A pelvic ultrasound was  ordered, secondary to her complaints of left lower quadrant pain.  The following day, her condition was essentially unchanged.  She still  complained of abdominal pain; however, it was only when she coughed.  She  did continue with a fairly significant congested cough, secondary to her  acute bronchitis.  The congestive heart failure had greatly improved.  She  had excellent urine output with diuresis on IV Lasix.  It was changed to  p.o. at this point.  Her ACE inhibitor was continued.  The beta blocker  would not be restarted at this point until her respiratory status had  improved.  She will be undergoing a cardiac catheterization for further  evaluation, secondary to her non-Q-wave myocardial infarction.  Blood sugars  were under control with additional sliding scale coverage.  The patient was taken to the cardiac catheterization laboratory on October 14, 2003.  The results showed the LAD with approximately 40% stenosis mid.  The circumflex had an area proximally in one of the OM's of 30%.  It was  widely patent, as was the stent in the proximal section.  By October 15, 2003, the patient was feeling much better.  Her breathing  had improved.  The white count was coming down to 19,000.  The potassium was  normal at 4.3.  BUN and creatinine were normal at 22 and 1.2.  Blood sugars  were adequately controlled.  The patient was anxious to go home.  Because  her white count had increased slightly from the day previously, a  repeat CBC  was obtained prior to letting the patient go home.  A CBC was rechecked whit  showed an increase in her white count fo 25,000.  A urinalysis showed only a  trace of leukocytes, otherwise was within normal limits.  A CBC with  differential was normal, with the exception of increased neutrophils at 80.  It was decided to keep her in the hospital one more day.  We will recheck a  CBC in the morning.  If trending down, she will be discharged home.  On October 16, 2003, a repeat CBC showed a white count down to 12.7.  Her  examination was unchanged.  Her vital signs were stable.  She was discharged  home on October 16, 2003.   DISCHARGE MEDICATIONS:  1. Lasix at her previous dose.  2. Imdur 30 mg daily.  3. Neurontin 300 mg t.i.d.  4. Aspirin 81 mg daily.  5. Zocor 40 mg daily.  6. Folic acid 1 mg daily.  7. Lexapro 40 mg q.h.s.  8. Methotrexate 2.5 mg, six tab q. Week.  9. Wellbutrin 150 mg daily. 10.      Risperdal 0.25 mg q.h.s.  11.      Avelox 400 mg daily x2 more days.  12.      Altace 5 mg daily.  13.      Clarinex p.r.n.  14.      Advair as taken previously.  15.      Combivent p.r.n.  16.      Toprol XL 25 mg daily.   DISCHARGE INSTRUCTIONS:  1. She has been instructed  not to undertake any strenuous activity for the     next two days, i.e. no lifting more than five pounds, bending, stooping,     or straining.  2. She is to maintain a low-salt, low-fat, low-cholesterol diet, as well as     her diabetic diet restrictions.  3. She is instructed to shower only for the next several days.  She is not     to soak in a bathtub.  4. She was instructed to call Dr. Sung Amabile' office for a follow-up     appointment for two to three weeks.  5. She has an appointment to see Dr. Bing Neighbors. Delcambre (GYN), on Monday,     October 18, 2003, at 2 p.m. in     regards to her left lower quadrant cyst noted on ultrasound.  6. She is to have a repeat BMP on October 18, 2003, at  [facility].  7. She will have an appointment to see Dr. [name] for followup on     Thursday, November 04, 2003, at 10:15 a.m.                                                Adrian Saran, N.P.    HB/MEDQ  D:  11/01/2003  T:  11/02/2003  Job:  [id number]   cc:   Oley Balm. Sung Amabile, M.D. [facility]

## 2011-01-05 NOTE — H&P (Signed)
NAME:  [name], [name]                          ACCOUNT NO.:  [id number]   MEDICAL RECORD NO.:  [id number]                   PATIENT TYPE:  INP   LOCATION:  4705                                 FACILITY:  [facility]   PHYSICIAN:  Danae Chen, M.D.          DATE OF BIRTH:  25-Jan-1946   DATE OF ADMISSION:  06/29/2002  DATE OF DISCHARGE:                                HISTORY & PHYSICAL   PHYSICIANS:  1. Primary care physician [name], M.D.  2. The patient also sees [name], M.D.  3. Primary cardiologist [name], M.D.  4. She also sees [name], M.D.   PROBLEM LIST:  1. Dyspnea on exertion, progressive.     A. Dry cough.     B. Recent resumption of tobacco use.  2. Coronary artery disease.     A. Status post stent placement, per Dr. Katrinka [name].     B. Normal Cardiolite with patent stent on catheterization on  September        2003.     C. History of congestive heart failure.  3. Lupus.  4. Fibromyalgia.  5. Psoriasis.  6. Anemia of chronic disease.   HISTORY OF PRESENT ILLNESS:  The patient is a 65 year old white female who  is here with her aunt in the emergency room while her aunt was  being  evaluated for an altered mental status, when she reported her history to the  ED physician evaluating her aunt about progressive dyspnea on exertion over  the last several weeks. She noted that it had been worse over the last few  days, that she was having  difficulty catching her breath, and that this was  a change from her baseline. She denied any chest discomfort, but did report  a cough that she had been having  for the past week that had been dry and  nonproductive. She did not relate a history of pleuritic chest pain, but did  have discomfort of the substernal area slightly following her coughing  spells.  She reports being compliant with her medications, and that she had  a recent cardiac evaluation in September 2003, which included a stress  Cardiolite and a catheterization, and was told that her heart disease was  better and that her stent was working well.   PAST MEDICAL/SURGICAL HISTORY:  Per problem list.   MEDICATIONS:  1. Zocor.  2. Lexapro.  3. Imdur.  4. Neurontin.  5. Vicodin.  6. Amoxycillin.   ALLERGIES:  1. INTRAVENOUS DYE.  2. SULFA DRUGS.  3. NEOSPORIN.  4. LATEX.   FAMILY HISTORY:  Noncontributory.   SOCIAL HISTORY:  She had not smoked for 30 years and had quit but then  resumed recently in the last several months. She is a social alcohol drinker  only.   PHYSICAL EXAMINATION:  VITAL SIGNS:  Temperature 98.5, pulse 102, blood  pressure 132/66, respiratory rate 18, O2 saturations  93% on room air.  GENERAL:  She is in no acute distress. She talks in complete sentences.  HEENT:  Oropharynx is clear. Her tympanic membranes are also clear. Nares  are patent. Pupils equal and reactive.  NECK:  Supple, no lymphadenopathy, no JVD.  LUNGS:  She had some mild scattered rales at the bases but otherwise she has  good air movement.  CARDIOVASCULAR:  Tachycardic but  otherwise regular, no murmurs are heard.  ABDOMEN:  Soft, active bowel sounds, no tenderness.  EXTREMITIES:  2+ pulses, no edema, they are warm.   LABORATORY DATA:  Hemoglobin 9.9, white count 10.5, platelets 237. Sodium  137, potassium 3.6, chloride 103, CO2 26, BUN 8, creatinine 0.9, glucose  119. CK-MB 28/0.9, troponin I 0.02, D-dimer slightly elevated at 0.58.   Portable chest x-ray shows no active disease, normal mediastinum, normal  heart size. An EKG shows a left bundle branch block. Otherwise normal. No  old EKG to compare it to at this time. I will have  to assume that this is  new.   ASSESSMENT AND PLAN:  Hypoxia and EKG changes with elevated D-dimers in  light of a possible hypocoagulable state with her lupus and known coronary  artery disease. Given this history, she will be admitted to telemetry and be  ruled out for a pulmonary  embolism as well as a myocardial infarction.  However, given the patient's appearance, her dry cough and her recent  cardiac workup, I suspect that the patient's current symptoms have more to  do with the bronchitis that is probably related to her recent resumption of  tobacco use other than anything else. I would highly encourage her to  quit  smoking  again, especially with her current  history. The patient does have  a history of dye allergies in the past, so we will premedicate with  prednisone and H2 and H1 blockers and obtain  a spinal CT scan in the  morning to rule out her PE. In the meantime she will get serial cardiac  enzymes, be placed on oxygen and telemetry and oral antibiotics will be  started for suspected bronchitis.                                               Danae Chen, M.D.    RLK/MEDQ  D:  06/30/2002  T:  06/30/2002  Job:  [id number]

## 2011-01-05 NOTE — Consult Note (Signed)
NAME:  [name], [name]                          ACCOUNT NO.:  [id number]   MEDICAL RECORD NO.:  [id number]                   PATIENT TYPE:  INP   LOCATION:  4707                                 FACILITY:  [facility]   PHYSICIAN:  Rosemarie Ax, N.P.                DATE OF BIRTH:  09-10-1945   DATE OF CONSULTATION:  04/16/2002  DATE OF DISCHARGE:  04/17/2002                                   CONSULTATION   REASON FOR CONSULTATION:  Anemia.   HISTORY OF PRESENT ILLNESS:  This is a 65 year old single female from  [place] who presented to the emergency room with left shoulder blade pain  similar to pain she had experienced with a myocardial infarction in 10/2001.  A workup was negative for enzymes x3, but found to have hemoglobin and  hematocrit of 8.5/25.4. The patient has a longstanding history of anemia  with a negative GI workup in 06/2001. There is no mention of other tests  other than barium enema. Symptoms at that time were primarily lower  abdominal pain. There is also a longstanding history of lupus and  polypharmacy.   PAST MEDICAL HISTORY:  1. Lupus 1986.  2. Fibromyalgia.  3. Lupus pneumonitis.  4. Endometriosis.  5. Anemia.  6. Depression.   PAST SURGICAL HISTORY:  1. Hysterectomy at age 40.  2. Bilateral mastectomies with implant at age 51, these were removed after     10 years.  3. Arthroscopy of knee 6/03.   ALLERGIES:  1. Sulfa.  2. Prednisone.  3. Neosporin.  4. Black rubber.   CURRENT MEDICATIONS:  1. Zocor 20 mg 1 p.o. q.d.  2. Neurontin 900 mg p.o. q.d.  3. Lexapro 20 mg p.o. q.d.  4. Bextra 20 mg p.o. q.d.  5. ASA 1 p.o. q.d.  6. Klonopin 10 mg p.o. q.d.  7. Risperdal 0.25 mg two at h.s.  8. Imdur 30 mg p.o. q.d.  9. Tequin 200 mg p.o. q.d.  10.      Gas-X p.r.n.  11.      Hydrocodone 5/500 p.r.n. pain.  12.      Advair p.r.n.  13.      Multivitamin q.d.  14.      Nitroglycerin 1/150 p.r.n.  15.      Clarinex p.r.n.   FAMILY HISTORY:   Father died of cardiac disease at age 65. Her mother is  alive at 65 with Alzheimer's. She has one sister also alive with lupus.   SOCIAL HISTORY:  The patient is divorced. She has no children. Her mother is  the only listed living with Alzheimer's. Prior to her disability in 1991  with lupus she worked as an Environmental health practitioner at [employer]. She has a 20-  30-pack-year smoking history and occasionally uses alcohol.   LABORATORY DATA:  Sodium 137, potassium 3.0, chloride 104, CO2 25, glucose  143, BUN 7, creatinine 0.9. Calcium 8.2,  total protein 6.2, albumin 2.6, AST  26, ALT 13, alkaline phos 117, total bilirubin 0.4. Troponin one 0.03. WBC  9.9, hemoglobin 8.5, hematocrit 25.3, platelets 243,000. MCV 89, MCHC 33.4,  RDW 13.6, peptide 144. Retic count 0.8. RBC 3.04, absolute retic count 24.3,  thiamine and B12 337. Iron is 12. TIBC is 245, 5% saturation, ferratin was  150, and folate was more than 20. Chest films shows cardiac enlargement and  probable pulmonary edema.   REVIEW OF SYMPTOMS:  Denies any headaches or vision changes. She has had  dyspnea. There has been little cough and no hemoptysis. There has also been  no chest pain or palpitations. She has had the scapular pain and increased  fatigue and weakness. She also has had a weight gain of approximately 70  pounds over three years. There has also been intermittent night sweats and  frequent eructations with increased flatus, indigestion, and occasional  dysphagia.   PHYSICAL EXAMINATION:   GENERAL:  This is a 65 year old white female alert and oriented.   HEENT:  Normocephalic.  PERRLA. EOMs intact. Sclerae anicteric. Mucous  membranes moist. Oropharynx without plaque or lesions. No cervical,  axillary, or inguinal nodes appreciated.   LUNGS:  Fibrotic sounds throughout.   CARDIOVASCULAR:  Regular rate and rhythm. No murmur, rub or gallop. S1, S2.   ABDOMEN:  Obese, soft, nontender. No organomegaly.   EXTREMITIES:   There is no cyanosis, clubbing, or edema.Marland Kitchen   NEUROLOGIC:  Alert and oriented x3. Cranial nerves 2-12 intact. Strength is  5/5.   ASSESSMENT:  This is a 65 year old female with a 12 year history of MI in  2002. She experienced similar scapular pain and presented to the ER. Her  first film indicated pulmonary edema and cardiac enlargement. Cardiac  enzymes were negative, but she was found to have a normochromic anemia with  a GI barium enema negative in 2002. Dr. Molly Maduro Ward saw and examined the  patient with the following recommendations: She has longstanding anemia and  Dr. Elesa Massed suggested that Plaquenil can cause aplastic anemia and the  medication should be stopped as soon as possible.  However, may also cause  chronic inflammation, this is not likely.    PLAN:  Discharge tomorrow with the history of myocardial infarction and a  hematocrit of 25. He recommends transfusion of one unit of packed red blood  cells before discharge. She is to follow up with Dr. Elesa Massed in the office for  a full workup the week of 9/8.                                               Rosemarie Ax, N.P.    JB/MEDQ  D:  04/21/2002  T:  04/21/2002  Job:  [id number]   cc:   Myriam Jacobson A. Fraser Din, M.D.  [address]  [place]  [place] [address]  Fax: [phone]   Maryln Gottron, M.D.  [address] [facility]  [place]  [place]  [address]  Fax: [phone]

## 2011-01-05 NOTE — Cardiovascular Report (Signed)
. [facility]  Patient:    [name], [name]                       MRN: [id number] Proc. Date: 11/14/00 Adm. Date:  [id number] Attending:  Meade Maw A CC:         Demetria Pore. Coral Spikes, M.D.  Meredith Staggers, M.D.  [name], M.D.   Cardiac Catheterization  REFERRING PHYSICIAN:  Helen A. Fraser Din, M.D.  PROCEDURE PERFORMED:  Percutaneous transluminal coronary angioplasty and stent of total occlusion of the right coronary artery.  INDICATIONS:  Patient with abnormal Cardiolite and demonstrated total occlusion of the right coronary of unknown duration.  There is also moderate to severe left coronary disease.  DESCRIPTION OF PROCEDURE:  Following diagnostic cardiac catheterization performed by Dr. Hillary Bow, we upgraded to a 7 French arterial sheath in the right femoral artery.  We then performed angioplasty after 6500 units of heparin, gave [place] an ACT of 240 seconds.  An Integrilin bolus followed by an infusion and a second bolus was started.  We used a 0.014 Cross-It XT 100 wire and were able to cross the total occlusion without significant difficulty.  We did use a 7 French side-hole JR4 guide catheter.  After crossing with the wire, we performed angioplasty with a 2.0 mm Maverick balloon, 15 mm long and then upgraded to a 3.0 x 20 mm long, CrossSail balloon.  Because of a dissection and sluggish flow, despite prolonged balloon inflations we placed a 3.5 x 18 mm long Penta stent to a peak pressure of 12 atmospheres.  The final result was quite nice with a smooth artery distally.  The patient experienced no complications during the procedure and the closing ACT was 254 seconds.  CONCLUSIONS:  Successful percutaneous coronary intervention on the right coronary with reduction in stenosis from 100% to less than 10% with TIMI grade 3 flow.  PLAN:  Aspirin and Plavix.  Would consider surgical revascularization with mitral valve surgery.   She may be a high risk for surgical revascularization and valve surgery given pulmonary hypertension, lupus pneumonitis, and steroid dependency. DD:  11/14/00 TD:  11/15/00 Job: [id number]

## 2011-01-05 NOTE — H&P (Signed)
NAME:  [name], [name]                          ACCOUNT NO.:  [id number]   MEDICAL RECORD NO.:  [id number]                   PATIENT TYPE:  EMS   LOCATION:  MAJO                                 FACILITY:  [facility]   PHYSICIAN:  Quita Skye. Waldon Reining, MD             DATE OF BIRTH:  08-20-1946   DATE OF ADMISSION:  10/10/2003  DATE OF DISCHARGE:                                HISTORY & PHYSICAL   HISTORY OF PRESENT ILLNESS:  [name] is a 65 year old white woman who  was admitted to [facility] with an acute myocardial infarction with  a non-ST segment elevation acute myocardial infarction (in the setting of  left bundle branch block) with congestive heart failure. The patient has a  history of coronary artery disease which dates back to March of 2002. At  that time, she underwent stenting of the right coronary artery after  suffering a small inferior myocardial infarction. In September of 2002, she  returned for repeat cardiac catheterization. This demonstrated a patent  stent, although there was residual moderate disease in the left anterior  descending and diagonal. The patient has not suffered any cardiac event  since that time. She presented to the emergency department this evening with  a history of dyspnea, which began earlier today. This was progressive and  not sudden, in onset. She also noted some chest pain. This was described as  a sharp right parasternal discomfort which radiated across her chest to the  left anterior chest. It waxed and waned over the course of the day. There  were no exacerbating or ameliorating factors. There was no associated nausea  or diaphoresis. She is uncertain as to whether it resembled the chest pain,  which heralded her prior myocardial infarction. She did not take any  nitroglycerin. The patient was found to be in congestive heart failure when  EMS arrived. Furosemide was administered, and her dyspnea has since improved  during her stay in  the emergency department. She is no longer experiencing  any chest pain at this time. The patient was known to have mild left  ventricular dysfunction with a small inferior wall motion abnormality from  her prior myocardial infarction. Her ejection fraction was 50%.   PAST MEDICAL HISTORY:  The patient has a number of other medical problems.  These include chronic anemia, diabetes mellitus, dyslipidemia, lupus,  fibromyalgia, and depression. There was no history of hypertension.   SOCIAL HISTORY:  The patient smokes 1 pack of cigarettes per day. The  patient lives alone. She is disabled due to her lupus.   FAMILY HISTORY:  There is a strong family history of heart disease. Her  father suffered from coronary artery disease in his 12's.   ALLERGIES:  The patient is reportedly allergic to SULFA.   CURRENT MEDICATIONS:  Include Furosemide, Imdur, and Zocor. She may be on  other medications but does not recall them at this  time.   PAST SURGICAL HISTORY:  Bilateral mastectomies in 1983 for fibrocystic  breast disease as well as a hysterectomy with bilateral salpingo-  oophorectomy in 1992.   REVIEW OF SYSTEMS:  Reveals no new problems related to her head, eyes, ears,  nose, mouth, throat, lung, gastrointestinal system, genitourinary system, or  extremities. There is no history of neurologic disorder. There is no history  of fever, chills, or weight loss. She has no history of cough, sputum  production.   PHYSICAL EXAMINATION:  VITAL SIGNS:  Blood pressure 97/67. Pulse 97 and  regular. Respiratory rate 22. Pulse ox 97% on 2 liters.  GENERAL:  The patient was a middle aged white woman in slight respiratory  discomfort. She is alert, oriented, appropriate, and responsive.  HEENT:  Head, eyes, nose, and mouth were normal.  NECK:  Without thyromegaly or adenopathy. Carotid pulses were palpable  bilaterally and without bruits.  CARDIAC:  Examination revealed normal S1 and S2. An S3 was  present. There  was no S4, murmur, rub or click. Cardiac rhythm is regular. No chest wall  tenderness was noted.  LUNGS:  Examination revealed rales at the lower third's bilaterally. There  were also mild scattered expiratory wheezes.  ABDOMEN:  Soft and nontender. There was no mass, hepatosplenomegaly, bruit,  distention, rebound, guarding, or rigidity. Bowel sounds were present.  BREAST:  Bilateral mastectomies were present.  RECTAL/GENITAL:  Examinations were not performed as they were not pertinent  to the reason for acute care hospitalization.  EXTREMITIES:  Without edema, deviation, or deformity. Radial and dorsalis  pedal pulses were palpable bilaterally.  NEUROLOGIC:  Unremarkable.   LABORATORY DATA:  Electrocardiogram revealed left bundle branch block.   Chest radiograph according to the radiologist, demonstrated mild  interstitial edema.   The initial set of cardiac markers revealed a myoglobin of greater than 500,  CK MB 17.6, and troponin 0.10. Creatinine was 2.3. White count was 16.5 with  a hemoglobin of 10.1 and hematocrit of 30.3. Her remaining laboratory  studies were pending at the time of this dictation.   IMPRESSION:  1. Non-ST segment elevation acute myocardial infarction (in the setting of     left bundle branch block).  2. Congestive heart failure.  3. Coronary artery disease. Status post right coronary artery stent in March     of 2002. The last cardiac catheterization in September of 2002     demonstrated a patent stent but residual moderate disease in the left     anterior descending and diagonal.  4. Renal insufficiency.  5. Leukocytosis.  6. Anemia.  7. Diabetes mellitus.  8. Dyslipidemia.  9. Lupus.  10.      Fibromyalgia.  11.      Depression.  12.      Status post bilateral mastectomies for fibrocystic disease.  13.      Chronic obstructive pulmonary disease.   PLAN: 1. Coronary care unit or step-down unit.  2. Serial cardiac enzymes.  3.  Aspirin.  4. IV heparin and Integrilin.  5. IV Nitroglycerin.  6. Metoprolol.  7. Diuresis.  8. Blood and urine cultures, sputum culture.  9. Dopamine as needed.  10.      Discontinue smoking.  11.      Echocardiogram.  12.      Albuterol and Atrovent nebulizers treatments.  13.      Inputs and Outputs, weight's.  14.      Further measures per Dr. Mayford Knife.  Quita Skye. Waldon Reining, MD    MSC/MEDQ  D:  10/10/2003  T:  10/11/2003  Job:  [id number]   cc:   [name], M.D.  [address]  [place], [place] [address]  Fax: [phone]

## 2011-01-05 NOTE — H&P (Addendum)
NAME:  STOREY, STANGELAND                          ACCOUNT NO.:  [id number]   MEDICAL RECORD NO.:  [id number]                   PATIENT TYPE:   LOCATION:                                       FACILITY:  [facility]   PHYSICIAN:  [id number]                                DATE OF BIRTH:  [birth year]   DATE OF ADMISSION:  04/15/2002  DATE OF DISCHARGE:  04/15/2002                                HISTORY & PHYSICAL   PRIMARY CARE Nazar Kuan:  Dr. Arsenio Loader.   IMPRESSION:  (As dictated by Dr. [name])  1. Shortness of breath, question multifactorial, with contribution of anemia     and possible mild high output congestive heart failure, though her BNP     was only 144.  Last heart catheterization September 2002 revealed     ejection fraction of 50%.  She denies chest pain similar to a typical     angina.  Her first set of cardiac enzymes were negative.  2. Anemia of uncertain etiology.  She has a history of anemia in the past     with hemoglobin 10 to 11 in September 2002.  Prior workup by GI     approximately 1 year ago was negative, which was a colonoscopy only.  She     did not have an EGD.  She denies or black or tarry-looking stools.  She     has a low albumin and has not been eating very well.  3. History of coronary artery disease; no current angina.  She came in with     an episode of back burning, but was not typical of her angina.  4. Fatigue, most likely related to anemia.  5. Hypokalemia with serum potassium of 2.0, which was supplemented.  6. Elevated serum glucose.  Apparently, she has seen Dr. Dayton Scrape about this     in the past.   PLAN:  (As dictated by Dr. [name])  1. Admit to telemetry on MI protocol, serial cardiac enzymes daily, 2D echo.  2. IV Lasix given in the ER.  3. Continue current meds.  4. Saline lock IV.  5. GI workup for an immune decreased albumin.  6. Adenosine Cardiolite; further cardiac workup pending those results.  7. Replete potassium.  8. Recheck  fasting glucose and hemoglobin A1c.   HISTORY OF PRESENT ILLNESS:  The patient is a pleasant 65 year old female  with known history of CAD status post stent RCA and subsequent heart cath  September 2002 revealed a patent stent but moderate  LAD/diagonal disease.  She also has a history of lupus and fibromyalgia, as well as hiatal hernia.  The patient has been experiencing shortness of breath for the last 2 or 3  days prior to admission with chest congestion.  She experienced an episode  of left subscapular  burning not similar to pain of prior MI in February  2002.  There was associated shortness of breath and weakness.  She did have  a transient pain radiating to her left jaw.  She took some sublingual  nitroglycerin with resolution of pain.  She called EMS, and was transported  to [facility] Emergency Room, where she had complained of shortness of  breath.  Chest x-ray revealed cardiac enlargement with coronary vascular  congestion.  She was given 80 of IV Lasix.  BNP slightly elevated at 144.  Serum potassium of 3.0.  She received 20 mEq of K-Dur in the emergency room  and subsequent 60 mEq of K-Dur when she arrived to unit.  Hemoglobin was  decreased at 8.5.    PAST MEDICAL HISTORY:  1. Coronary atherosclerotic heart disease.  (May 05, 2001)  Cardiac     catheterization by Dr. [name], followup of abnormal Cardiolite and     dyspnea and fatigue, revealing 1 vessel borderline obstructive CAD.  LAD     30% after diagonal 1.  There was 60-70% LAD after diagonal 2.  There was     70% osteal and 50% mid-diagonal 2.  OM 50% proximal.  RCA:  Patent stent     with only a 30% in-stent restenosis.  She has evidence of an old small     inferior MI.  EF 50% with mild hypokinesis in inferior wall.  At that     time, her coag was discontinued secondary to her symptoms of dyspnea and     fatigue.  Her ACE inhibitor was increased secondary to left ventricular     and diastolic pressure of  135/25.  2. (November 14, 2000) Stent 100% RCA.  EF 35%.  Water MR.  3. (March 2003) 2D echo revealing EF of 40% with right ventricular     enlargement.  RV systolic pressure 55 mm/Hg with moderate TR, mild MR,     mild-to-moderate AR.  4. History of SLE followed by Dr. Demetria Pore. Levitin, but not clearly     documented, according to patient.  5. History of depression.  6. Hysterectomy with BSO in 1972.  7. History of bilateral mastectomy in 1982 for fibrocystic disease.  She had     subsequent explantation of implants in '93.  8. Fibromyalgia.  9. History of hiatal hernia.  10.      History of anemia/hemoglobin approximately 7.0 August 2002.  She     has had prior colonoscopy by Dr. Liliane Bade, but no endoscopy.  11.      (January 2002) Interstitial pneumonitis, consistent with acute     alveolitis.  She had steroids and subsequently had steroid     psychosis/anasarca.   ALLERGIES:  CONTRAST DYE, SULFA, prior PREDNISONE PSYCHOSIS, NEOSPORIN,  BLACK RUBBER.   MEDICATIONS:  1. Neurontin 300 mg 3 tabs t.i.d.  2. Clarinex 5 mg 1 b.i.d.  3. Imdur 30 mg per day.  4. Zocor 20 mg q.h.s.  5. Risperdal 8.25 mg 2 tabs p.o. q.h.s.  6. Bextra 20 mg per day.  7. Lasix 20 mg per day.  8. Lexapro 1 p.o. b.i.d.  Question 10 mg strength.  9. Hydrocodone 1.5 to 1 tab a day.  10.      Plaquenil 200 mg p.o. q.d.   FAMILY HISTORY:  Her father died at age 50, CAD.  One sister who has lupus.  Mother is 65 and has Alzheimer's.   REVIEW OF SYSTEMS:  Positive  for headache, vision changes; positive dyspnea.  Negative hemoptysis.  Does complain of subscapular pain.  Positive  ____________.  Has had weight gain over the last 3 years of 70-plus pounds.  Complains of intermittent night sweats.  Complains of a lot of belching and  flatus, as well as indigestion with occasional dysphagia.  Denies abdominal  pain, melena, and bright red blood per rectum.   PHYSICAL EXAMINATION:  (As dictated by Dr. [name])  VITAL SIGNS:  Blood pressure is initially 103/55, currently 112/56.  Heart  rate:  Initially 104, currently 98.  O2 sat on room air:  Initially 88%,  currently 95%.  Respiratory rate:  18.  Temperature 99.9, subsequently 98.3.  GENERAL:  She is a well-nourished, pleasant 65 year old female in no acute  distress.  HEENT:  Brisk bilateral carotid upstrokes without bruit. Neck no JVD, no  thyromegaly.  CHEST:  Decreased breath sounds throughout, some crackles.  CARDIAC:  Regular rate and rhythm without murmur, rub, or gallop.  Normal  S1, S2.  ABDOMEN:  Soft, nondistended, positive bowel sounds. Negative abdominal  aorta, no femoral bruit.  No evidence of organomegaly or masses.  Difficult  exam secondary to obesity.  EXTREMITIES:  Negative pedal edema.  Good pedal pulses.  NEUROLOGIC:  Alert and oriented x3.  Grossly nonfocal.  GENITORECTAL:  Deferred.   LABORATORY DATA:  Hemoglobin 8.5.  White count 10.0 on  August 2002.  Differential within normal range.  Hematocrit 25.4.  Platelets are 243.  WBC  of 9.9.  Sodium 137, potassium 2.0, which was supplemented with 20 mEq of K.  Chloride 104, CO2 25, BUN 7, creatinine 0.9, glucose elevated at 143.  Albumin 226.  Calcium 8.2.  LFTs within normal range.  First CK of 38, MB  fraction 1.2.  Second CK of 31, MB fraction of 1.3.  Troponin-I was 0.03,  subsequently 0.04.  BNP 144.  EKG revealed sinus tach 110 beats per minute,  old inferior MI.  Late R wave progression.  No acute ischemic changes.  Chest x-ray revealed pulmonary vascular congestion.  ____ QA MARKER: 673 ____           Salomon Fick, N.P.                       [phone]    MES/MEDQ  D:  04/17/2002  T:  04/20/2002  Job:  [id number]   cc:   Gloris Manchester R. Mayford Knife, M.D.  [address]  [place]  [place] [address]  Fax: [phone]   Meredith Staggers, M.D.

## 2011-01-05 NOTE — Discharge Summary (Signed)
NAMEKENLEY, [name] NO.:  [id number]   MEDICAL RECORD NO.:  [id number]          PATIENT TYPE:  INP   LOCATION:  2904                         FACILITY:  [facility]   PHYSICIAN:  Guy Franco, P.A.       DATE OF BIRTH:  1946/01/13   DATE OF ADMISSION:  08/04/2007  DATE OF DISCHARGE:  08/13/2007                               DISCHARGE SUMMARY   DISCHARGE DIAGNOSES:  1. Congestive heart failure exacerbation with transient vent      dependent, resolved.  2. Severe nonischemic cardiomyopathy EF 20%, status post biV AICD.  3. One=vessel coronary artery disease with a patent right coronary      artery stent.  4. Upper respiratory infection.  5. Lupus.  6. Diabetes mellitus.  7. Severe MR.  8. Chronic renal insufficiency with a baseline creatinine around 1.3.  9. Hypokalemia, resolved.  10.Hypernatremia, resolved.   HOSPITAL COURSE:  Ms.  [name] is a 65 year old female, who has what is  felt to be nonischemic cardiomyopathy with an EF around 25 to 30%. She  does have single-vessel coronary artery disease. She did require  stenting, I believe this was a LAD lesion. A Cardiolite was done several  weeks prior to admission  and this showed no inducible ischemia. She  recently had exacerbation of CHF for mechanical ventilation.  Because of  this, we felt that it was prudent to bring the patient in to have a  cardiac catheterization to assure no underlying coronary artery disease.   On 08/04/2007, the patient came in for a left heart catheterization.  Right coronary artery stent was patent. There was no disease in the left  system. LV gram showed an EF of 15 to 20%, which could be out of  proportion of ischemic cardiomyopathy, that is where it was felt to be  dilated and nonischemic cardiomyopathy in nature.  She is also known to  have severe MR.  With that, the patient has severe MR, but given severe  LV dysfunction, she has been felt to be too high of a risk for mitral  valve  replacement.  During this admission, she developed respiratory  distress and hemoptysis while undergoing catheterization procedure. She  was subsequently intubated and required mechanical ventilatory support.  Critical care medicine was consulted and helped [place] with this patient's  care until she was extubated on 08/07/2007.   Because of her significantly poor health including cardiomyopathy, she  was felt to be a candidate for biV AICD and this was planned by Dr.  Deirdre Peer on August 11, 2007.  This was planned to do on August 12, 2007.  The patient underwent the procedure with a [employer] device  utilized and tolerated the procedure well.  The following day, she was  felt to be ready for discharged to home.   Lab studies during the patient's hospitalization included white count  8.4, hemoglobin 10.6, hematocrit 31.6, platelets 243, sodium 132,  potassium 4.3, BUN 27, creatinine 1.46. Hemoglobin A1C 6.0. BNP 261.  Total cholesterol greater than 240. HDL 38. LDL 54. Triglycerides 101.  TSH 0.648. Chest  x-ray twice without complicating features and no  abnormalities postprocedure x-ray.   The patient was given a sheet regarding activity and wound care to  implantation of this device. She was also given a sheet regarding her  medications. This yellow sheet is pertinent in the chart, but I can tell  you that she was on the following medications prior to admission and per  my knowledge continued to be on these medications. Imdur 30 mg a day,  baby aspirin daily, Lorazepam as needed, Wellbutrin 150 general  anesthesia daily, Hydralazine 25 mg q.i.d., Metformin 1000 mg one half  tablet b.i.d., Lantus insulin 20 units subcu daily, Dicyclomine p.r.n.,  Advair p.r.n., Combivent p.r.n., Cymbalta 16 mg a day. The patient has  plans to follow up with Dr. Mayford Knife in approximately one week with a B-  Met to assure no worsening of the renal insufficiency.      Guy Franco, P.A.     LB/MEDQ  D:   09/29/2007  T:  09/30/2007  Job:  [id number]   cc:   [name], M.D.

## 2011-01-05 NOTE — H&P (Signed)
NAME:  [name], [name]                          ACCOUNT NO.:  [id number]   MEDICAL RECORD NO.:  [id number]                   PATIENT TYPE:  EMS   LOCATION:  MAJO                                 FACILITY:  [facility]   PHYSICIAN:  Jackie Plum, M.D.             DATE OF BIRTH:  11/27/1945   DATE OF ADMISSION:  11/08/2003  DATE OF DISCHARGE:                                HISTORY & PHYSICAL   CHIEF COMPLAINT:  Shortness of breath.   HISTORY OF PRESENT ILLNESS:  The patient is a 65 year old Caucasian lady who  was recently discharged from the hospital on November 01, 2003, after  hospitalization for myocardial infarction.  According to the patient, since  discharge has been doing well until about a week after discharge when she  started to experience shortness of breath which worsened with exertion.  She  also experienced some fatigue which cough productive of greenish-yellowish  sputum, chills without fever.  She does not have any history of nausea,  vomiting, abdominal pain, black or bloody stools, or diarrhea.  She denies  any visual changes, headaches, dysuria, or frequency of urination.  The  patient came to the ED today on account of her worsening symptomatology.  She does not have any chest pain, palpitations, PND, or orthopnea.   At the ED, the patient's initial blood pressure was 88/51 mmHg with  temperature of 99.7 degrees F.  Her heart rate was 104 per minute,  respiratory rate of 20, and her saturation was 81% on room air.   X-ray done showed bilateral air space disease, left greater than right, with  a differential of infectious infiltrate versus asymmetric edema.  Followup  BNP was 196.0 pg/ml.  She was given IV antibiotics, and we are asked to  verify admission.   PAST MEDICAL HISTORY:  1. She has been in [facility] for subendocardial myocardial     infarction.  2. History of CHF.  3. Coronary artery disease status post stent to her right RCA in 2002.  4.  History of renal insufficiency.  5. Diabetes.  6. Psoriasis.  7. History of left bundle branch block.  8. History of dyslipidemia.  9. History of lupus.   ALLERGIES:  SULFA medications.   CURRENT MEDICATIONS:  1. Altace 5 mg p.o. daily.  2. Methotrexate 2.5 mg 6 tablets every Friday.  3. Toprol XL 25 mg daily.  4. Ditropan XL 5 mg daily.  5. Risperdal 0.5 mg daily.  6. Folic acid 2 mg daily.  7. Wellbutrin XL 150 mg daily.  8. Mobic 15 mg daily.  9. Zocor 40 mg daily.  10.      Lasix 20 mg 2 tablets daily.  11.      Lexapro 10 2 tablets daily.  12.      Imdur 30 mg daily.  13.      Femogen Forte 1 daily.  14.  Aspirin 81 mg daily.  15.      Lorazepam 1 mg daily p.r.n.  16.      Allegra 180 mg daily p.r.n.  17.      Hydrocodone 5/500 p.r.n.  18.      Dicyclomine 20 mg p.r.n.   FAMILY HISTORY:  Positive for heart disease.   SOCIAL HISTORY:  The patient is retired.  She used to work as an Airline pilot.  She is disabled due to lupus according to the patient.  She had been smoking  one pack of cigarettes per day until the last three weeks since her first  hospitalization.  She does not drink alcohol.   REVIEW OF SYSTEMS:  Positives and negatives are as in HPI above, otherwise  Review of Systems unremarkable.   PHYSICAL EXAMINATION:  VITAL SIGNS: Blood pressure 97/41 mmHg, pulse 93,  respiratory rate 26, temperature 97.7 degrees F.  Saturation 92% on 2 liters  oxygen by nasal cannula.  GENERAL:  She was not in acute respiratory distress.  HEENT:  Normocephalic and atraumatic.  Pupils equal, round, and reactive to  light.  Extraocular movements were intact.  She had mild conjunctival pallor  without icterus.  NECK:  Supple.  No JVD.  CARDIAC:  Regular rate and rhythm without gallops or murmur.  LUNGS:  She had bilateral rhonchi, more on the right.  I could not  appreciate any  obvious crackles.  No wheezes were appreciated.  ABDOMEN:  Soft, nontender.  No  hepatosplenomegaly was appreciated.  EXTREMITIES:  She did not have edema.  Dorsalis pedis pulses were present,  2+ bilaterally.  CNS:  The patient was alert and oriented x 3.  No acute focal deficits.   LABORATORY AND X-RAY DATA:  Lab work:  Chest x-ray as mentioned in HPI  above.  BNP as mentioned in HPI above.   EKG shows sinus rhythm at 76 beats per minute.  She did not have any acute  ST wave changes.   CBC:  White cell count 10.6, hemoglobin 9.8, hematocrit 28.5, MCV 98.2,  platelet count 266.  Pro time 13.4, INR 1.0.  Sodium 159, potassium 3.8,  chloride 104, glucose 149, CO2 of 30, BUN 12, creatinine 1.1.  Cardiac  enzymes x 2 negative for myocardial infarction.  UA unremarkable for  infection.   IMPRESSION:  Community-acquired pneumonia.  The patient was recently  discharged from the hospital; however, I believe this may be community  acquired.  We will start her on Avelox with other supportive measures  including IV fluid supplementation and oxygen.  Will obtain blood cultures.  The patient sputum should be cultured should  there be no improvement over the next 24 to 48 hours.  Antibiotics may be  changed to cover nosocomial organisms should there be no improvement.  The  patient's blood pressure is borderline.  __________  will admit her a  telemetry bed.  Will hold her antihypertensive medications for now.                                                Jackie Plum, M.D.    GO/MEDQ  D:  11/08/2003  T:  11/08/2003  Job:  [id number]   cc:   Melissa L. Ladona Ridgel, MD  [address] [place], [place] [address]  Fax: [phone]   Hollice Espy, M.D.  Meredith Staggers, M.D.  [address]  [place]  [place] [address]  Fax: [phone]

## 2011-01-05 NOTE — Consult Note (Signed)
[name], [name]:  [id number]   MEDICAL RECORD NO.:  [id number]          PATIENT TYPE:  INP   LOCATION:  4714                         FACILITY:  [facility]   PHYSICIAN:  [name], M.D.DATE OF BIRTH:  May 01, 1946   DATE OF CONSULTATION:  09/25/2004  DATE OF DISCHARGE:                                   CONSULTATION   CONCLUSIONS:  1.  Congestive heart failure, recurrent, etiology uncertain.      1.  History of congestive heart failure in 2002 attributed to coronary          artery disease with total occlusion of the right coronary.      2.  Left ventricular ejection fraction by echocardiogram on this          occasion 35%.  2.  Coronary atherosclerotic heart disease.      1.  Status post right coronary artery stent on October 17, 2000 for a          right coronary total occlusion.      2.  Repeat cardiac catheterization as recently as October 15, 2003          demonstrated patent right coronary, moderate left anterior          descending disease.      3.  Mildly elevated troponin-I on this occasion of uncertain          significance.  3.  History of systemic lupus and rheumatoid arthritis.  4.  Diabetes mellitus.  5.  Left bundle branch block with QRS duration of 175 ms.  6.  Chronic obstructive pulmonary disease.  7.  History of renal insufficiency.   RECOMMENDATIONS:  1.  Adenosine Cardiolite to rule out progression of coronary disease. This      will also allow a different means of LV assessment since the      echocardiogram was of poor quality.  2.  Optimize LV failure therapy by increasing ACE inhibitor therapy,      consider increasing beta blocker therapy and potentially switching to      Coreg and also consider increasing diuretic therapy.  A long acting      nitrate may also be benefit to this patient.  3.  A 2-D echocardiogram already done.  4.  If significant decrease in LV function confirmed by Cardiolite would      consider LV  re-synchronization with 5-E pacer, assuming there is no      evidence of progression of coronary disease by Cardiolite.  5.  Discontinue nonsteroidal anti-inflammatory therapy.  6.  As mentioned, consider adding long nitrate.   COMMENTS:  The patient is a 65 year old who is followed chronically by Dr.  [name].  She has a history in 2002 of presenting with congestive heart  failure and being found to have a totally occluded right coronary with  inferior hypo-akinesis.  She was treated with right coronary  revascularization using percutaneous technique with stent implantation.  There was apparent improvement in LV function from EF in the 35% range up to  low normal, 40-50%  range.  She has done relatively well since that time.  She denies any chest discomfort.  She had no chest discomfort prior to  finding totally occluded right coronary.  Starting 1-2 days prior to  admission she began noting puffiness, orthopnea, PND and exertional dyspnea  in the absence of chest pain.  She used additional doses of Lasix above her  ordinary medication regimen but this did not help the shortness of breath.  She came to the recovery room and was admitted to the hospital with mild  decompensation of congestive heart failure.   HOME MEDICATIONS:  1.  Altace 5 mg daily.  2.  Methotrexate 2.5 mg six tablets each Friday.  3.  Toprol XL 25 mg daily.  4.  Ditropan XL 5 mg daily.  5.  Risperdal 0.5 mg daily.  6.  Folic acid 2 mg daily.  7.  Wellbutrin XL 150 mg daily.  8.  Mobic 15 mg daily.  9.  Motrin 800 mg t.i.d.  10. Zocor 40 mg daily.  11. Lasix 20 mg two tablets daily.  12. Lexapro 20 mg per day.  13. Imdur 30 mg per day.  14. Feagin forte two tablets per day.  15. Aspirin 81 mg per day.  16. Lorazepam p.r.n.  17. Allegra.  18. Lortab.  19. Dicyclomine 20 mg p.r.n.  20. Neurontin 300 mg t.i.d.   ALLERGIES:  1.  SULFA.  2.  LATEX.   FAMILY HISTORY:  Positive for coronary artery  disease.   HABITS:  She does not smoke or drink alcohol.   PHYSICAL EXAMINATION:  GENERAL: The patient is in no acute distress.  VITAL SIGNS:  Admitting blood pressure was 160/60, heart rate 104,  respirations 22 and not labored. Today the blood pressure is 130/70.  Heart  rate 99, temperature 99.3.  NECK:  Veins are not distended with the patient sitting at 90 degrees.  LUNGS: Right base reveals mild decrease in breath sounds with faint rales.  No wheezing.  CARDIAC:  Perhaps a faint S4 gallop.  No obvious murmur.  ABDOMEN:  Not obese.  No ascites.  EXTREMITIES:  No edema.  NEUROLOGIC:  Exam is grossly intact.   LABORATORY DATA:  Hemoglobin 10, white blood cell count 13.1, BUN and  creatinine normal at 10 and 1.0. Potassium is 3.5, INR not checked.  Troponin-I on admission was 0.12, repeat is 0.14 with normal CPK level.  TSH  is normal at 3.934, BNP 489.  The echocardiogram done today demonstrates  what I believe to be decreased LV function compared with the prior such  study done a year ago.  The EF is difficult to quantitate because the study  is of poor quality.  There clearly appears to be asynchronous LV activation  due to the patient's left bundle and I am estimating the ejection fraction  to be moderately reduced in the 35-40% range.  There is mild aortic  regurgitation, mild mitral regurgitation present.   DISCUSSION:  The patient presented with 1-2 day history of bloating,  orthopnea, PND all in the absence of chest discomfort.  She has a prior  history of congestive heart failure.  She has a history of right coronary  total occlusion that was recanalized with stenting in 2002.  She has been  admitted to the hospital on this occasion with mild CHF on chest x-ray,  mildly elevated BNP and improved after some gentle IV diuresis.  The decline  in LV function by echo needs to  be explained.  Does she have decline in LV function because of concomitant coronary disease  progression, is there just  progressive of native underlying poor sub-straight, is her change to Motrin  as anti-inflammatory causing fluid retention and aggravating heart failure.  The patient's borderline elevated troponin is probably related to heart  failure but coronary disease progression needs to be excluded with an  Adenosine Cardiolite.   Dr. Mayford Knife will see the patient and follow starting on February 02/2005.      HWS/MEDQ  D:  09/25/2004  T:  09/25/2004  Job:  [id number]   cc:   Meredith Staggers, M.D.  [address], [address]  [place]  [place] [address]  Fax: [phone]   [name], M.D.  [address], [address]  [place], [place] [address]  Fax: [phone]

## 2011-01-05 NOTE — Discharge Summary (Signed)
NAME:  [name], [name]                          ACCOUNT NO.:  [id number]   MEDICAL RECORD NO.:  [id number]                   PATIENT TYPE:  INP   LOCATION:  4705                                 FACILITY:  [facility]   PHYSICIAN:  Lazaro Arms, M.D.        DATE OF BIRTH:  [birth year]   DATE OF ADMISSION:  06/29/2002  DATE OF DISCHARGE:  07/01/2002                                 DISCHARGE SUMMARY   PRIMARY CARE PHYSICIAN:  Meredith Staggers, M.D.   DISCHARGE DIAGNOSES:  1. Pneumonia.  2. History of lupus and lupus pneumonitis.  3. Coronary artery disease, status post stent and normal catheterization in     September 2003.  4. Fibromyalgia.  5. Anemia of chronic disease.  6. Psoriasis.   DIAGNOSTICS:  1. Chest x-ray which showed patchy bilateral interstitial disease which     looks consistent with congestive heart failure but a BMP which was within     normal limits which rules it out.  2. VQ scan which showed matched VQ defects consistent with findings on x-     ray, enough for low probability.   HISTORY OF PRESENT ILLNESS:  The patient was in the emergency room visiting  her aunt when she had complained to one of the emergency room doctors of  increasing dyspnea on exertion and had a cough.  Given her history of  coronary artery disease, she was admitted for rule out PE.  There was a D-  dimer which was sent which was slightly elevated, and so the patient was  also placed on heparin in anticipation of ruling out a PE.  Because of a  history of dye allergy, she was started on some Decadron protocol in  anticipation of getting a CT scan the next day.  However, upon further  talking with the patient, she said that even with premedication she had an  allergy to IV dye, and so VQ scan was done on June 30, 2002.   The patient was admitted and ruled out for myocardial infarction.  A chest x-  ray revealed patchy bilateral infiltrate consistent with CHF; however,  because  the BMP was negative and her exam was more consistent with a  bronchitis with her now having productive cough, she was started on  Zithromax and albuterol for her symptoms.  She remained on room air  throughout her hospitalization.  She was ambulating without too much  shortness of breath and was discharged home in stable condition on July 01, 2002.   FOLLOW UP PLAN:  She is to follow up with Dr. Tiburcio Pea on Friday at 10 a.m.  With her history of lupus pneumonitis, she is somewhat immunocompromised and  should be followed closely as well as for resolution of symptoms.  She is to  follow up earlier if she is having any increasing shortness of breath or  chest pain.    DISCHARGE MEDICATIONS:  1. Zithromax  250 mg p.o. daily x4 days.  2. Albuterol MDI with spacer to take q.4 h. as needed.  3. Her home medications which include Zocor, Lexapro, Imdur, Neurontin,     Vicodin, and Risperdal.                                               Lazaro Arms, M.D.    AMC/MEDQ  D:  07/01/2002  T:  07/01/2002  Job:  [id number]   cc:   Dr. Lanetta Inch, M.D.

## 2011-01-05 NOTE — H&P (Signed)
NAME:  [name], [name]                          ACCOUNT NO.:  [id number]   MEDICAL RECORD NO.:  [id number]                   PATIENT TYPE:  INP   LOCATION:  5015                                 FACILITY:  [facility]   PHYSICIAN:  Hortencia Pilar, M.D.                 DATE OF BIRTH:  03/05/1946   DATE OF ADMISSION:  03/20/2003  DATE OF DISCHARGE:                                HISTORY & PHYSICAL   CHIEF COMPLAINT:  Shortness of breath.   HISTORY OF PRESENT ILLNESS:  The patient is a 65 year old Caucasian female  with a past medical history significant for lupus, diabetes, and coronary  artery disease, who presents with a three-day history of shortness of breath  and productive sputum.  The patient states that over the last few days she  has been getting short of breath with minimal activity.  She has also had a  cough which she felt was more upper respiratory with some phlegm, but not  discolored.  She denied any fever or chills associated with this increase in  sputum.  She states that the shortness of breath comes on with minimal  activity with no associated chest pain.  She does not have an increase in  shortness of breath while lying and she has no PND or lower extremity edema.  She states that she has had this problem with shortness of breath and was  treated for over nine weeks with antibiotics by Dr. Dayton Scrape, felt to be  secondary to some type of upper respiratory infection.  However, she just  recently completed a medication and she is unsure of which antibiotic she  was on for that duration.   REVIEW OF SYSTEMS:  The rest of her review of systems is negative.   PERTINENT PAST MEDICAL HISTORY:  She does have a history of lupus and  pneumonitis and was intubated in the past for acute renal failure secondary  to her lupus.  She is not on any chronic immunosuppressants for her lupus,  however, she is on methotrexate for arthritis associated with psoriasis.  Her past medical history is  significant for psoriatic and lupus with past  involvement in her lungs, diabetes, fibromyalgia, a history of myocardial  infarction over one year ago with a stent to the RCA, depression, and high  cholesterol.   PAST SURGICAL HISTORY:  None recent.   SOCIAL HISTORY:  She has positive tobacco use.  No alcohol or drugs.   MEDICATIONS:  Imdur, Wellbutrin, aspirin, Lasix, Neurontin, Zocor, Lexapro,  Risperdal, methotrexate, and folic acid.   ALLERGIES:  1. SULFA.  2. NEOSPORIN.  3. IVP DYE.   FAMILY HISTORY:  Not elicited.   PHYSICAL EXAMINATION:  VITAL SIGNS:  The temperature is 99.1 degrees, the  blood pressure is 139/72, the heart rate is 106, respiratory rate 22, and  she is at 93% on room air.  GENERAL APPEARANCE:  She  is in no apparent distress.  She is alert and  oriented x 3.  She has a normal affect.  She is breathing comfortably on  room air.  HEENT:  Extraocular movements intact.  Pupils are equal, round, and reactive  to light.  The oropharynx is clear.  LUNGS:  She has fine dry crackles in bilateral upper lung fields.  She has  no E to A changes.  HEART:  She has sinus tachycardia with a 2/6 systolic ejection murmur at the  left sternal border.  She has no JVD.  ABDOMEN:  Soft and nontender.  Normal bowel sounds.  EXTREMITIES:  No lower extremity edema.  SKIN:  She has no rashes.   LABORATORY DATA:  Her white count is 17.6, hemoglobin 10.3, hematocrit 29.8,  MCV 101.4, and platelet count 246.  Sodium 138, potassium 3.7, chloride 106,  CO2 24, glucose 197, BUN 11, creatinine 1.0, calcium 8.9.  CK total 25, CK-  MB 0.7, troponin 0.02.  She had a chest x-ray which showed bilateral central  infiltrate/edema, cardiac enlargement, and no effusion.  She had an EKG  showing sinus tachycardia of 112 with a left bundle branch block, LAD, left  atrial enlargement, some nonspecific ST changes, and no old EKG to compare.   ASSESSMENT:  This is a 65 year old female with a  history of lupus, psoriatic  arthritis, diabetes, and coronary artery disease, presenting with shortness  of breath.   IMPRESSION AND PLAN:  1. Pulmonary.  The patient's shortness of breath seems to be pulmonary most     likely related to central infiltrates likely representing pneumonia with     associated white count findings.  Will proceed with treating the patient     with antibiotics with Avelox. Will need to get information from Dr.     Dayton Scrape about what medicine she has been on recently for pneumonia.  I     would like to obtain sputums if possible to make sure that antibiotic     treatment is appropriate.  Will assess clinical course.  If no     improvement, will look down differential in terms of lupus, but     clinically it does not appear that she is having a lupus flare.  2. Anemia.  Her hematocrit is 29.8.  The patient states this is within     normal limits for her normal level.  However, MCV is elevated at 101.4.     Will need to check a folic acid and vitamin B12 level.                                                Hortencia Pilar, M.D.    SL/MEDQ  D:  03/21/2003  T:  03/21/2003  Job:  [id number]

## 2011-01-05 NOTE — Discharge Summary (Signed)
NAME:  [name], [name]                          ACCOUNT NO.:  [id number]   MEDICAL RECORD NO.:  [id number]                   PATIENT TYPE:  INP   LOCATION:  4731                                 FACILITY:  [facility]   PHYSICIAN:  Corinna L. Lendell Caprice, MD             DATE OF BIRTH:  [birth year]   DATE OF ADMISSION:  11/08/2003  DATE OF DISCHARGE:  11/10/2003                                 DISCHARGE SUMMARY   DIAGNOSES:  1. Community-acquired pneumonia.  2. Resolved hypotension.  3. Chronic anemia.  4. Coronary artery disease, status post history of stent placement.  5. History of congestive heart failure with an ejection fraction of about     45%.  6. History of renal insufficiency.  7. Lupus.  8. Hyperlipidemia.  9. Diet controlled diabetes.   DISCHARGE MEDICATIONS:  1. Avelox 400 mg p.o. every day for 11 more days.  2. She is to hold her methotrexate for two weeks.  3. She is to continue her outpatient medications including Altace 5 mg p.o.     every day, Toprol XL 25 mg p.o. every day, Ditropan XL 5 mg p.o. every     day, Risperdal 0.5 mg p.o. every day, Folate 1 mg p.o. b.i.d., Wellbutrin     XL 150 mg p.o. every day, MOBIC 15 mg p.o. every day, Zocor 40 mg p.o.     every day, Lasix 40 mg p.o. every day, Lexapro 20 mg p.o. every day,     Imdur 30 mg p.o. every day, iron supplements, aspirin 81 mg p.o. every     day, lorazepam 1 mg daily as needed for anxiety, Allegra 180 mg p.o.     every day, Vicodin as needed for pain.  4. She is to monitor her blood pressures and hold Altace, Toprol, and Imdur     if low and followup with Dr. Mayford Knife if low.   FOLLOWUP:  She is to follow up with Dr. Francesca Oman in one month and  should have a repeat chest x-ray in four to six weeks.   DIET:  Should be a low salt diabetic.   ACTIVITY:  No heavy exertion for a week.   CONDITION AT DISCHARGE:  Stable.   PERTINENT LABORATORIES:  Blood cultures are negative to date.  Her CBC on  admission  revealed a white count of 10.6, hemoglobin 9.8, hematocrit 28.5,  MCV 982, platelet count 256.  INR 1.0.  B-type natriuretic peptide was 196.  Basic metabolic panel was significant for a glucose of 146, otherwise  unremarkable.  CPK MB and troponin were negative times three with the  exception of a slightly elevated troponin of 0.08.  Her hemoglobin dropped  to 8.7, and due to her pneumonia and hypoxia and heart disease, she was  given one unit of packed red blood cells after which her hemoglobin  increased to 9.3.  B12 level is at this time  pending.   SPECIAL STUDIES AND RADIOLOGY:  EKG upon admission showed normal sinus  rhythm, left atrial enlargement and left bundle branch block.   Chest x-ray on admission showed bilateral pneumonia versus asymmetric  pulmonary edema.  Repeat chest x-ray showed some improvement in the left  upper lobe but persistent bilateral infiltrates.   HISTORY AND HOSPITAL COURSE:  [name] is a pleasant 65 year old white  female with multiple medical problems who presented to the emergency room  complaining of shortness of breath.  She also had generalized fatigue and a  cough productive of greenish yellow sputum, subjective chills, her appetite  had been poor.  She was recently discharged from the hospital after a  myocardial infarction and had been started on Altace and Toprol.  Initially  in the emergency room she had a blood pressure in the 80's.  Her temperature  was about 100 degrees.  Heart rate was 104, respiratory rate was 20, and her  oxygen saturation was 81% on room air.  She had lateral rhonchi, otherwise  had a fairly unremarkable physical examination.  Chest x-ray showed  bilateral pneumonia or possibly asymmetric edema.  Her clinical picture was  more consistent with community-acquired pneumonia and she was started on IV  Avelox, oxygen.  Her cardiac medications were held due to the hypotension  and she was given gentle IV hydration.  She  improved tremendously during her  hospitalization and at the time of discharge her blood pressure was about  105/55 on Lasix and Imdur.  Her oxygen saturations were normal on room air.  She was ambulating, tolerating a diet and feeling much better.  She had been  afebrile essentially throughout her hospitalization.  Repeat chest x-ray  showed some improvement and she was therefore being discharged to home.  With hydration, however, her hemoglobin did drop and due to her hypoxia,  hypotension, recent MI, and current pneumonia, she was transfused one unit  of packed red blood cells for better oxygen carrying capacity.  This also  helped her symptoms.  I have encouraged her to obtain a home blood pressure  cuff and told her that she may resume her cardiac medications but to be  particularly careful for return of her hypotension, and if her blood  pressure drops, to hold her Imdur, Lasix, Toprol, and Altace.  Due to the  infection, I have asked that she hold her methotrexate for two weeks.  Her  other medical problems remain stable during the hospitalization.   Total time on the day of discharge is 45 minutes.                                                Corinna L. Lendell Caprice, MD    CLS/MEDQ  D:  11/10/2003  T:  11/11/2003  Job:  [id number]   cc:   [name], M.D.  [address], [address]  [place], [place] [address]  Fax: [phone]   Meredith Staggers, M.D.  [address]  [place]  [place] [address]  Fax: [phone]

## 2011-01-05 NOTE — Cardiovascular Report (Signed)
[name], [name]                          ACCOUNT NO.:  [id number]   MEDICAL RECORD NO.:  [id number]                   PATIENT TYPE:  INP   LOCATION:  2035                                 FACILITY:  [facility]   PHYSICIAN:  [name], M.D.                  DATE OF BIRTH:  02/27/1946   DATE OF PROCEDURE:  10/14/2003  DATE OF DISCHARGE:                              CARDIAC CATHETERIZATION   PROCEDURE:  1. Left heart catheterization.  2. Coronary angiography.   OPERATOR:  [name], M.D.   INDICATIONS:  Congestive heart failure and positive enzymes.   COMPLICATIONS:  None.   IV ACCESS:  Via right femoral artery with 6 French sheath.   This is a 65 year old very pleasant white female with a history of tobacco  abuse, also diabetes mellitus, dyslipidemia, lupus, fibromyalgia,  depression, chronic anemia.  Had a history of coronary disease dating back  to March 2002 at which time she underwent stent of the RCA after small  inferior wall myocardial infarction.  She returned for a repeat cath in  September 2002 which showed a patent stent with some moderate disease of the  LAD and diagonal.  She presented recently to the hospital with increasing  shortness of breath and acute bronchitis as well as congestive heart failure  and some mild chest pain.  Cardiac enzymes were mildly elevated and was felt  to be either consistent with non-Q-wave myocardial infarction versus  congestive heart failure with elevated cardiac enzymes in the setting of  renal insufficiency.  She was diuresed.  Congestive heart failure resolved.  She now presents for cardiac catheterization .   Of note, LV function at 2-D echocardiogram was 45-50% which is similar to  what her last echo in the office was.  She does have evidence of diastolic  dysfunction by 2-D echocardiogram and trivial AI.   The patient is brought to the cardiac catheterization laboratory in a  fasting nonsedated state.  Informed consent  was obtained.  The patient was  connected to continuous heart rate and pulse oximetry monitoring,  intermittent blood pressure monitoring.  The right groin was prepped and  draped in a sterile fashion.  1% Xylocaine was used for local anesthesia.  Using modified Seldinger technique, a 6 French sheath was placed in the  right femoral artery.  Under fluoroscopic guidance, a 6 [place] JL-4 catheter  was placed in the left coronary artery.  Multiple cine films were taken at  30-degree RAO, 40-degree LAO views.  This catheter was then  exchanged out  over guide wire for 6 French JL-3.5 catheter because proper engagement of  the left main could not be obtained with the size 4.  There was significant  damping of the left main with insertion of the 6 French catheter.  This  catheter was then exchanged out over guide wire for a 6 [place] JR-4 catheter  which  was placed under fluoroscopic guidance in the right coronary artery.  There was evidence of spasm at the ostium at the insertion of the coronary  catheter.  Therefore, 200 mcg of nitroglycerin IV intracoronary was given  and repeat angiography was performed showing widely patent ostium of the  right coronary artery and widely patent graft.  The catheter was then  removed out of the ostium of the right coronary artery.  A guide wire was  placed back in the catheter and into the left ventricular cavity.  The  catheter was advanced in the left ventricular cavity and left ventricular  pressure was obtained.  Left ventriculography was not performed because of  the patient's borderline renal insufficiency.  Catheter was then removed  over a guide wire.  A 5 [place] JR-3.5 was then advanced over a guide wire  into the left coronary ostium with adequate engagement.  Multiple cine films  were taken at a 30-degree RAO, 40-degree LAO views.  The catheter was then  removed over guide wire.  At the end of the procedure, all catheters and  sheaths were removed.   Manual compression was performed until adequate  hemostasis was obtained.  The patient was transferred back to the room in  stable condition.   RESULTS:  Left main coronary artery is widely patent and trifurcates into  the left anterior descending artery, ramus branch and left circumflex  artery.   Left circumflex artery is widely patent throughout its course.  It gives  rise to first obtuse marginal branch which is widely patent and bifurcates  distally into two daughter branches.   The ramus branch has a proximal 30% narrowing, but is widely patent and  traverses to the apex.   The left anterior descending artery gives rise to a first diagonal which is  patent.  There is then a 40% narrowing between the first and second  diagonals.  The second diagonal is patent.  The rest of the LAD is patent  throughout its course.   The right coronary artery is widely patent throughout its course including  the stent and bifurcates distally in the posterior descending artery and  posterior lateral artery which are widely patent.  There is a stent in the  proximal portion, again, which is widely patent.   Left ventriculography was not performed because of mildly increased BUN and  creatinine.  LV was 94/1 mmHg.  LVEDP 4 mmHg.  Aortic pressure 100/56 mmHg.   ASSESSMENT:  1. Congestive heart failure, resolved.  2. Elevated cardiac enzymes most likely secondary to congestive heart     failure exacerbation with mild renal insufficiency.  3. Acute bronchitis.  4. Widely patent right coronary artery stent with nonobstructive coronary     disease of the ramus and left anterior descending.  5. Left lower quadrant pain with 2 x 3 x 5-cm left ovarian cyst.   PLAN:  IV fluid and bed rest.  Discharge in the morning if stable.  Continue  Avelox as outpatient for 7 days total.  Follow up with Dr. Sung Amabile as an outpatient.  Aspirin and Plavix.  Will start beta blockers if blood pressure  is more stable  in the morning.                                               [name], M.D.  TT/MEDQ  D:  10/14/2003  T:  10/15/2003  Job:  [id number]   cc:   Oley Balm. Sung Amabile, M.D. [facility]

## 2011-01-05 NOTE — Discharge Summary (Signed)
NAME:  [name], [name]                          ACCOUNT NO.:  [id number]   MEDICAL RECORD NO.:  [id number]                   PATIENT TYPE:  INP   LOCATION:  4707                                 FACILITY:  [facility]   PHYSICIAN:  Lynell Greenhouse DICTATOR                    DATE OF BIRTH:  12-20-1945   DATE OF ADMISSION:  04/14/2002  DATE OF DISCHARGE:  04/17/2002                                 DISCHARGE SUMMARY   DATE OF BIRTH:  02/17/1946   PROCEDURE:  1. April 16, 2002 - Adenosine Cardiolite suspicious for small area of     reversibility in the apex and the adjacent anterior wall, concerning for     small area of ischemia. Ejection fraction of 43%. Old infarct/scar     inferior and inferolateral walls.  2. Transfusion of one unit packed red blood cells.   CONSULTATIONS:  1. Blase Mess, M.D. - Hematology/Oncology.  2. Dorena Cookey, M.D. - Gastroenterology.   DISCHARGE DIAGNOSES:  1. Shortness of breath with mild congestive heart failure though BNP only     144. Likely related to high cardiac output syndrome, secondary to anemia     setting. LV dysfunction questions myocardial ischemia.     a. The patient ruled out for myocardial infarction by negative serial        cardiac enzymes. EKG was without changes in the          baseline.        b. Improvement of dyspnea with single dose IV diuretics in the  emergency room and also a transfusion of blood          products with hemoglobin of 8.5.        c. Adenosine Cardiolite suspicious for very small area of ischemia  anterior/apex with ejection fraction of 43%. Old inferior          lateral scar.  1. Anemia, question related to Plaquenil. Hematology/Oncology consult by Dr.     Filbert Berthold who felt that Plaquenil can be a culprit for          aplastic anemia. Plaquenil discontinued. He was also seen by Dr.  Madilyn Fireman of Gastroenterology, who noted that the patient          had a normal colonoscopy last year. Also noted the patient with  guaiac  negative stools. Four to six weeks follow-up with          patient has an outpatient with EGD and small bowel evaluation as an  outpatient. Iron studies indicate low iron stores and she          will be started on iron as an outpatient.  1. Hypokalemia. Serum potassium 3. Supplemented and within normal range the     remainder of admission.  2. Hyperglycemia with glucose running from 126 to 143. Hemoglobin A1C 6.4.     Dietary counseling  provided on diabetic diet. No concentrated sweets. She     will continue follow-up with Dr. Dayton Scrape. She deferred outpatient [facility] teaching at this time because of her ongoing     commitment with other things such as outpatient PT. She will be amenable     to this in the near future.  3. History of SLE, though not clearly documented per patient. Her Plaquenil     was discontinued as it was thought that it may be contributing to her     anemia.   DISPOSITION:  The patient was discharged to home in stable condition.   DISCHARGE MEDICATIONS:  1. Neurontin 300 mg two tabs three times a day.  2. Clarinex 5 mg po twice a day.  3. Imdur 30 mg po QD.  4. Zocor 20 mg po QHS.  5. Risperdal 0.25 mg two tabs po QHS.  6. Bextra 20 mg po QD (on hold for now).  7. Lasix 20 mg po QD.  8. Lexapro 10 mg (or dose as before) one tab twice a day.  9. Hydrocodone one half to one tab as needed pain as before.  10.      Nitroglycerin tab 0.4 SL every five minutes as needed chest pain.     After three tabs and fifteen minutes.  11.      Enteric coated baby aspirin 81 mg per day.  12.      Discontinue Plaquenil 200 mg po QD.  13.      FeSO4 325 mg po three times a day.   DIET:  Diabetic diet, low fat, low salt.   SPECIMENS:  NPO after midnight on April 23, 2002 for cardiac  catheterization with possible percutaneous intervention on April 24, 2002  by Dr. [name] at [facility] Short Stay Unit. Premedicate with   Prednisone 18 hours before and one to two hours prior to heart  catheterization. Prior contrast dye caused rash. The patient will hold Lasix  on the morning of admission   FOLLOW UP:  1. Dorena Cookey, M.D. (Office number [phone]) at [address], as an outpatient. His office will call with appointment to be seen     within four to six weeks.  2. Blase Mess, M.D. [facility] [phone]). His office will     call with follow-up visit for anemia.  3. [name], M.D. on Thursday, May 07, 2002 at 10:00 a.m.  4. Francesca Oman, M.D. [phone]). The patient will call to schedule a     follow-up office visit with him.   HISTORY OF PRESENT ILLNESS:  [name] is a very pleasant 65 year old  female with known history of coronary artery disease, status post Stent RCA  March 2002. Follow-up cardiac catheterization September 2002 revealed  moderate diagonal and LAD disease. Ejection fraction of 50% with mild  hypokinesis inferior wall. Also history of SLE, fibromyalgia, pulmonary  hypertension. She had been experiencing a three to four day history of chest  congestion, shortness of breath and increasing fatigue. She developed a left  subscapular burning which was not similar to pain of prior myocardial  infarction on March 2002. She has associated shortness of breath and  weakness and transiently had radiation to her left jaw. She took [employer]  with apparently resolution. She complained of shortness of breath and  summoned EMG and was triaged to [facility] emergency room  where  chest x-ray revealed some pulmonary vascular congestion. She was treated  with 80 mg of IV Lasix. Her BNP was not significant. Serum potassium of 3.0  was supplemented with total of 80 mEq on the morning of admission. The  hemoglobin was noted to be decreased at 8.5.   PAST MEDICAL HISTORY:  1. Atherosclerotic cardiovascular disease May 05, 2001.    a.  Cardiac catheterization by Dr. [name] revealing patent RCA Stent        RCA Stent with only 30% end Stent restenosis. Old          small inferior myocardial infarction with ejection fraction of 50%,  mild hypokinetic inferior wall. LAD with 30% diagonal one          and 60% diagonal two. Diagonal two was 70% osteal and 50% mid with  50% proximal OM. Her Coreg was discontinued at          that time secondary to preceding complaints of dyspnea and fatigue.  She was initiated on ace inhibitor secondary to          increased LV EDT of 135/25.        b. November 14, 2000 - Dr. Verdis Prime Stent of 100% RCA. Ejection  fraction of 35% with moderate MR. Prior inferior          myocardial infarction. Question duration of occluded RCA.        c. March 2002 - 2-D echocardiogram revealing ejection fraction of 40%,  right ventricular enlargement. RV systolic pressure          60 mmHg. Moderate TR and mild MR. Mild to moderate AR.  1. SLE followed by Dr. Coral Spikes on Plaquenil.  2. Depression.  3. History of hysterectomy in 1992 with bilateral salpingo-oophorectomy.  4. Bilateral mastectomies in 1983 for fibrocystic breast disease. She had     subsequent explantation of implants in 1998.  5. History of fibromyalgia.  6. History of hiatal hernia. See by Dr. Dorena Cookey in the past for     colonoscopy which was reportedly normal in 2002.  7. January 2002 - Interstitial pneumonitis, consistent with acute     alveolitis. Treated with steroids. Unfortunately, subsequent steroid     induced psychosis/anasarca.  8. History of contrast dye rash/allergy.   HOSPITAL COURSE:  The patient was admitted to telemetry where she ruled out  for myocardial infarction by negative serial cardiac enzymes and daily EKG.  The EKG is unchanged from baseline. Dr. Dorena Cookey from gastroenterology was  consulted regarding low hemoglobin of 8.5. He noted her indices revealed  normocytic anemia which was possibly chronic for  patient. Last hemoglobin  was approximately 10 and September of 2002. He doubted gastrointestinal  blood loss but felt could be malabsorption process such as celiac disease  given her low albumin. He did Hemoccult her stool which was negative for  blood. Dr. Liliane Bade felt that the patient was stable for discharge and  would follow-up on her anemia in his office. He planned to review her  history of prior hemoglobins. He would potentially schedule her for an  endoscopy with small bowel biopsy. He also felt her anemia could partly be  due to lupus and/or Plaquenil. Dr. Blase Mess of Hematology/Oncology was  consulted. He felt that Plaquenil could indeed by the culprit causing  aplastic anemia and discontinued this medication. He noted that though the  patient has a history of SLE, that this was not clearly documented  according  to the patient. The patient was transfused with one unit of packed red blood  cells ordered by Dr. Filbert Berthold. She will follow-up with Dr. Filbert Berthold as an outpatient for full workup. Adenosine Cardiolite revealed evidence of old  infarct/scar involving inferior and inferolateral wall. Small area of  reversibility in the apex and adjacent anterior wall. Questionable small  area of ischemia. Ejection fraction was 43%. She will be recommended for  follow-up cardiac catheterization as an outpatient with premedication with  Prednisone and Benadryl secondary to prior rash on exposure to  catheterization dye.   LABORATORY DATA:  WBC 9.9, hemoglobin and hematocrit 8.5 and 25.4. On the  27th, her hemoglobin and hematocrit was 8.7 and 26.6. Retic site was within  normal limits at 0.8 (reference  0.4 to 3.1). Red blood cells low at 3.04  (reference 3.87 to 5.11). Absolute retic site 24.3 (reference 19 to 186).  Admission sodium of 137, potassium 3.0 (supplemented and potassium on August  27th was 3.8). Chloride 104, CO2 25, glucose 143 (126 on August 27th). BUN  7, creatinine 0.9.  Follow-up BUN decreased at 2.6 and calcium decreased at  8.1. Hemoglobin A1C was 6.4 (reference range 4.6 to 6.5). CK of 38 and MB  fraction 1.2. Troponin I 0.03. CK of 31, MB 1.3, troponin I 0.04.  Third CK 25 and MB fraction 0.8. Troponin I 0.02. BNP at 144. Cholesterol at  108, triglyceride 68, HDL 33, LDL 61, VLDL of 14. Serum iron low at 12, TIBC  low at 245 and percent fat low 5%. Vitamin B12 334 (okay). Serum folate  greater than 20 (okay). Serum Ferritin okay at 150. Haptoglobin 467  (reference range 16-200).                                                Makita Blow DICTATOR    DD/MEDQ  D:  04/17/2002  T:  04/20/2002  Job:  [id number]   cc:   Gloris Manchester R. Mayford Knife, M.D.  [address]  [place]  [place] [address]  Fax: [phone]   Meredith Staggers, M.D.

## 2011-01-05 NOTE — Discharge Summary (Signed)
[name], [name]                ACCOUNT NO.:  [id number]   MEDICAL RECORD NO.:  [id number]          PATIENT TYPE:  INP   LOCATION:  5532                         FACILITY:  [facility]   PHYSICIAN:  Hollice Espy, M.D.DATE OF BIRTH:  [birth year]   DATE OF ADMISSION:  09/01/2006  DATE OF DISCHARGE:  09/12/2006                               DISCHARGE SUMMARY   PRIMARY CARE PHYSICIAN:  Royetta Crochet, M.D. of [place].   DISCHARGE DIAGNOSES:  1. Asthma exacerbation.  2. Right lower lobe pneumonia, community-acquired.  3. Chronic systolic congestive heart failure.  4. Old left bundle branch block.  5. Diabetes mellitus.  6. Coronary artery disease.  7. Depression.  8. History of psoriatic arthritis and lupus.   NEW DISCHARGE MEDICATIONS:  1. Albuterol nebulizers 2.5 mg q.6h, x1 week, then q.6h. p.r.n..  2. Atrovent 0.5 mg q.6h. x1 week, then q.6h. p.r.n.  3. She is also going home on home oxygen at 2 liters.   PREVIOUS MEDICATIONS ON DISCHARGE:  1. Vytorin 10/40 p.o. daily.  2. Advair 250 one puff b.i.d.  3. Combivent inhaler two puffs four times a day.  4. Theogen one p.o. daily.  5. Imdur 30 p.o. daily.  6. Methotrexate 15 p.o. weekly.  7. Cymbalta 40 mg p.o. daily.  8. Percocet 5/325 p.o. q.6h. p.r.n.  9. Ativan 0.5 mg p.o. b.i.d. p.r.n.  10.Flexeril 10 mg p.o. t.i.d. p.r.n.  11.Coreg 3.125 p.o. b.i.d.  12.Lasix 20 p.o. b.i.d.  13.Folic acid 1 mg p.o. daily.  14.Nitrostat 0.4 mg sublingual p.r.n.  15.Aspirin 81 p.o. daily.  16.Dicyclomine 20 mg p.o. daily p.r.n.  17.Altace 5 mg p.o. daily   HISTORY OF PRESENT ILLNESS:  The patient is a 65 year old white female  with past medical history of compensated systolic CHF, psoriasis,  hypertension, and bronchial asthma who one week prior to admission  started having productive cough with yellowish sputum.  She came to the  emergency room and was evaluated.  She was given a dose of IV  antibiotics and sent home with a  prescription for p.o. antibiotics.  She  says, however, that after she got home she was just so weak that she  could not go out and fill her prescription and soon called the  paramedics to come back a day later.  She was noted at that point to be  wheezing.  She received nebulizers, antibiotics, and it was felt that  her pneumonia had set off an exacerbation of asthma.  The patient was  admitted for treatment.   HOSPITAL COURSE:  In regard to the patient's asthma and pneumonia, she  was started on IV Avelox.  Over the next several days she was put on  steroids, breathing treatments, and supplemental oxygen.  She tolerated  this well, and her wheezing continued to improve slowly.  A chest x-ray  done on September 07, 2006 showed actually increase in bilateral airspace  edema and pneumonia.  There was a question that the patient was perhaps  becoming volume overloaded, and she was given an extra dose of IV Lasix.  She diuresed moderately.  By September 09, 2006, her O2 saturations were  checked, and she was saturating at about 87% on room air.  The patient  was continued on steroid taper and weaned, continued IV antibiotics and  nebulizer treatments.  Followup chest x-ray done on September 11, 2006  showed minimal improvement in her airway disease.  At this point,  however, her lungs were clear.  She was not felt to have any pulmonary  edema or pneumonia, and this was felt to be a process of both chronic  lung disease as well as poor respiratory effort.  An inspirometer was  added.  Steroids were titrated down and completed on September 12, 2006.  Her antibiotics were discontinued after completing a full 10-day course.  By September 12, 2006, she was feeling somewhat improved.  At this time it  was felt best that she go home with oxygen, as she still has problems  with oxygen desaturation.  Plan will be for the patient to continue on  her inspirometer,  continue nebulizer treatments, and get home  health  PT/OT as well as CHF rehab protocol.  She will follow up with her PCP in  one week's time.  At that time he may assess her with a followup chest x-  ray and determinate whether or not she needs to remain on oxygen.   In regard to the patient's diabetes mellitus, the patient had episodes  of hyperglycemia secondary to addition of steroids.  Now that her  steroids have been weaned off, she will continue on her previous  diabetic medications.   In regard to the patient's rheumatoid arthritis and lupus, this was  stable.  Her methotrexate is being continued at home.   The patient's overall disposition from initial presentation is improved.  Her activity will be slowly increased, but she will be receiving home  health PT/OT.  Her diet will be a carb modified, heart healthy diet and  she will follow up with her PCP, Dr. Stacie [name], in one week.      Hollice Espy, M.D.  Electronically Signed     SKK/MEDQ  D:  09/12/2006  T:  09/12/2006  Job:  [id number]   cc:   Royetta Crochet, MD

## 2011-01-05 NOTE — Cardiovascular Report (Signed)
[place]  Patient:    [name], [name]                       MRN: [id number] Proc. Date: 11/14/00 Adm. Date:  [id number] Attending:  Meade Maw A                        Cardiac Catheterization  PROCEDURE PERFORMED:  Left heart catheterization, coronary angiography, single plane ventriculogram.  INDICATION FOR PROCEDURE:  Congestive heart failure with redistribution on a Cardiolite in the lateral wall.  DESCRIPTION OF PROCEDURE:  After obtaining written informed consent, the patient was brought to the cardiac catheterization lab in a postabsorptive state.  Preoperative sedation was achieved with IV Versed.  The right groin was prepped and draped in a usual sterile fashion.  Local anesthesia was achieved using 1% Xylocaine.  A 6 French hemostasis sheath was placed into the right femoral artery using a modified Seldinger technique.  Selective coronary angiography was performed using a JL4, JR4 Judkins catheter.  Multiple views were obtained.  All catheter exchange were made over a guidewire.  The hemostasis sheath was flushed following each engagement.  Following the obtainment of the films, the films were then reviewed with Dr. Katrinka Blazing for an additional opinion, and it was felt that intervention on the patients right coronary artery would be in her best option.  FINDINGS:  The aorta pressure is 130/86, LV pressure is 123/24.  Single plane ventriculogram revealed inferior, posterior akinesis, ejection fraction is approximately 35%.  Left main coronary artery:  The left main coronary artery was short, bifurcated into the left anterior descending and circumflex.  There was significant dampening with each engagement of the catheter.  There was a 30% ostial left main disease.  Left anterior descending:  The left anterior descending was a small vessel, gave rise to a small to moderate diagonal #1, diagonal #2 and a small diagonal #3 before ending  as an apical recurrent branch.  There was a long lesion in the proximal LAD involving the first and second diagonal of up to 70%.  Circumflex vessel:  The circumflex vessel was a small vessel that gave rise to a early OM-1, early OM-2 and then went on to end as a small AV groove vessel. There was no significant disease in the circumflex vessel.  Right coronary artery:  The right coronary artery was a large artery and was 100% occluded.  There was small homo-collaterals from the right coronary artery.  IMPRESSION:  Total occlusion of the proximal right coronary artery, duration of occlusion is unknown.  This is associated with inferior, posterior akinesis. There is redistribution in the lateral wall.  The left anterior descending is a small vessel.  There was proximal 70% stenosis noted.  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 She had a hospitalization in January of 2001 for pneumonia which resulted in ventilator requirement for six days associated with adult respiratory distress syndrome.  The patient has moderate mitral regurgitation and left ventricular dysfunction.  Her right ventricular systolic pressure is 51 mmHg.  Based on the comorbid condition, it was felt that the patient would benefit from intervention on the right coronary artery.  Dr. Katrinka Blazing proceeded with intervention on the right.  A surgical opinion will be obtained in regards of the proximal left anterior descending disease. DD:  11/14/00 TD:  11/15/00 Job: [id number]

## 2011-01-05 NOTE — Cardiovascular Report (Signed)
[place]  Patient:    [name], [name] Visit Number: [id number] MRN: [id number]          Service Type: CAT Location: [facility] 2899 [id number] Attending Physician:  [name] Dictated by:   [name], M.D. Proc. Date: 05/05/01 Admit Date:  05/05/2001                          Cardiac Catheterization  PROCEDURES PERFORMED: Left heart catheterization, coronary angiography, and left ventriculography.  INDICATIONS: Chest pain, abnormal Cardiolite, dyspnea, as well as fatigue.  COMPLICATIONS: None.  INTRAVENOUS ACCESS: Right femoral artery, 6 French sheath.  HISTORY OF PRESENT ILLNESS:  This is a 65 year old, white female, with a history of proximal RCA stenosis, status post PTCA and stenting in the proximal RCA, also, with 70% LAD narrowing with no evidence of ischemia in that territory on Cardiolite. She now presents with worsening fatigue, dyspnea, and chest pain.  Recent Cardiolite shows decreased perfusion in the inferior wall. Unfortunately, we do not have a baseline, status post angioplasty to determine how much of this is due to infarction myocardium. She now presents for cardiac catheterization.  DESCRIPTION OF PROCEDURE: The patient is brought to the cardiac catheterization laboratory in a fasting, nonsedated state.  Informed consent was obtained.  The patient was connected to continuous heart rate and pulse oximetry monitoring, and intermittent blood pressure monitoring. The right groin was prepped and draped in a sterile fashion.  Lidocaine 1% was used for local anesthesia.  Using the modified Seldinger technique, a 6 French sheath was placed in the right femoral artery.  Under fluoroscopic guidance, a 6 [place] JL4 catheter was placed in the left coronary artery.  Multiple cine films were taken in a 30 RAO, 40 degree LAO views. This catheter was then exchanged out over a guide wire for a 6 French Judkins JR4 catheter, which was placed  under fluoroscopic guidance into the right coronary artery.  Multiple cine films were taken in the 30 degree RAO, 40 degree LAO view.  This catheter was then exchanged over a guide wire for a 6 French angled pigtail catheter which was placed in the left ventricular cavity. Left ventriculography was performed in the 30 degree RAO view, using a total of 30 cc of contrast at 13 cc/sec.  The catheter was pulled back across the aortic valve without any significant pressure gradient.  The catheter was then removed over a guide wire.  At the end of the procedure, all catheters and sheaths were removed. Manual compression was performed until adequate hemostasis was obtained.  The patient was transferred back to her room in stable condition.  RESULTS: 1. The left main coronary artery is widely patent throughout its course    and bifurcates into a left anterior descending artery and left circumflex    artery. 2. The left anterior descending artery gives rise to a first diagonal branch    which is patent and there was a 30% narrowing distally to the takeoff of    the first diagonal branch.  On the second diagonal originates off the mid    LAD with a 70% ostial narrowing and then a 50% mid narrowing. Just distal    to the takeoff of the second diagonal there is a 60-70% narrowing of the    LAD.  The rest of the LAD is patent throughout its course. 3. Left circumflex is widely patent throughout its course and gives rise to  a    first very large obtuse marginal branch with a 50% proximal narrowing.    The rest of the vessel is patent throughout its course. 4. Right coronary artery shows a patent stent site proximally with a 30%    in-stent re-stenosis.  The vessel then gives rise to three acute marginal    branches which are patent and then is widely patent throughout the rest of    this course and bifurcating distally into a posterior descending artery and    posterolateral artery, all of which are  widely patent.  LEFT VENTRICULOGRAM: Left ventriculography performed in the 30 degree RAO view using a total of 30 cc of contrast at 13 cc/sec. showed mild hypokinesis of the inferior wall.  Overall EF 50%. Left ventricular pressure 135/25 mmHg, aortic pressure 134/68 mmHg.  ASSESSMENT: 1. One-vessel borderline obstructive coronary disease of the left anterior    descending with no evidence of ischemia on Cardiolite in that area. 2. Patent right coronary artery stent site with mild re-stenosis. 3. Low-normal left ventricular function.  PLAN: Discharge home today.  Medical management.  Discontinue Coreg secondary to fatigue and dyspnea.  We are going to start afterload reduction with ACE inhibitors since her LVEDP is up. She does have a wall motion abnormality versus Norvasc.  Need to look at her office chart to see if she has tolerated ACE inhibitors in the past. Dictated by:   [name], M.D. Attending Physician:  [name] DD:  05/05/01 TD:  05/05/01 Job: [id number]

## 2011-01-05 NOTE — Discharge Summary (Signed)
NAMEYAILEN, [name]                ACCOUNT NO.:  [id number]   MEDICAL RECORD NO.:  [id number]          PATIENT TYPE:  INP   LOCATION:  4714                         FACILITY:  [facility]   PHYSICIAN:  Kela Millin, M.D.DATE OF BIRTH:  09/02/1945   DATE OF ADMISSION:  09/24/2004  DATE OF DISCHARGE:  10/01/2004                           DISCHARGE SUMMARY - REFERRING   DISCHARGE DIAGNOSES:  1.  Congestive heart failure exacerbation.  2.  Pneumonia, bilateral.  3.  Hypokalemia, resolved.  4.  Coronary artery disease, status post right coronary stent on October 17, 2000 and status post repeat cardiac catheterization on October 15, 2003 that showed moderate left descending disease. Mildly elevated      troponin's with Adenosine Cardiolite negative during this hospital stay.  5.  Chronic anemia.  6.  History of lupus.  7.  History of psoriatic arthritis.  8.  History of Sjogren's disease.  9.  History of renal insufficiency.  10. History of hyperlipidemia.  11. History of fibromyalgia.  12. History of depression.  13. History of diabetes mellitus, diet controlled.   PROCEDURE:  1.  Midline catheter.  2.  VQ scan, low probability.  3.  Stress Cardiolite on September 26, 2004, negative for ischemia.  4.  Chest x-ray on September 27, 2004, bilateral asymmetric air space disease.  5.  A 2-D echocardiogram on September 25, 2004, technically difficult study.      Ejection fraction 30% to 40%. Overall left ventricular systolic function      moderately decreased. Asynchronous left ventricular activation.   HISTORY OF PRESENT ILLNESS:  The patient is a 65 year old white female with  extensive past medical history including coronary artery disease, congestive  heart failure, lupus, psoriatic arthritis, Sjogren's disease, diabetes who  presented with increasing episodes of wheezing and shortness of breath. The  patient stated that about 2 nights prior to presentation, she started to  notice some wheezing and some mild shortness of breath. At that point, she  took some Duratuss with some improvement of her symptoms. She also took an  extra dose of her Lasix. She stated that she noticed that she was  increasingly more fatigued and that this usually occurs when her hemoglobin  goes down. The patient continued to have the wheezing and shortness of  breath and this worsened and so she came to the emergency room for further  evaluation. She admitted to paroxysmal nocturnal dyspnea, orthopnea,  increased abdominal girth and some slight swelling in her hands. She  reported that the increased abdominal girth was slight and had started 2  days prior to presenting. She reported a brief episode of mild chest  pressure/heaviness, which felt more like she was not able to breath. She was  given nitroglycerin and this immediately went away. The patient reported  that she had been compliant with her medications as well as her diet.   PHYSICAL EXAMINATION:  VITAL SIGNS:  Temperature 98.7, blood pressure  156/63, pulse 104, respiratory rate 22, O2 saturation of 96%.  LUNGS:  Crackles at bases.  EXTREMITIES:  No edema.  The remainder of her physical examination was within normal limits.   LABORATORY DATA:  Sodium 141, potassium 2.0, chloride 106, BUN 8, glucose  134. A pH was 7.4, pCO2 32, bicarb 24. Hemoglobin 3, hematocrit 10.2. White  cell count 11. Platelet count 234,000. Troponin less than 0.05. Brain  natriuretic peptide 489.   Initial chest x-ray:  Mild congestive heart failure.   Electrocardiogram:  Left bundle branch block, normal sinus rhythm.   HOSPITAL COURSE BY PROBLEM:  1.  Congestive heart failure:  Upon admission, the patient was started on      intravenous Lasix for diuresis and was also maintained on her Toprol.      Serial cardiac enzymes were done as well as an echocardiogram and      cardiology was consulted. The echocardiogram results, as stated above,       ejection fraction 30% to 40% and cardiology followed and the patient was      started on Coreg. Her serial cardiac enzymes revealed mildly elevated      troponin's - 0.1 and 0.12. A stress Cardiolite was done and this was      negative for ischemia. Following the above interventions, the patient's      shortness of breath persisted and a repeat brain natriuretic peptide was      done and it was within normal limits at 85.8. Her hemoglobin and      hematocrit was repeated and it was decreased to 8.7 and 24.9, which was      decreased from her post transfusion hemoglobin and hematocrit of 10 and      28.8. A followup chest x-ray was also done to further evaluate this      persistent shortness of breath and it showed asymmetric bilateral air      space disease. Following these findings, the patient was started on      antibiotics to cover for pneumonia and also was transfused to keep her      hemoglobin and hematocrit about 10. A VQ scan was also done and this was      low probability for pulmonary embolus. Following this subsequent      interventions, the patient's symptoms improved, and she was able to      ambulate and was oxygenating well on room air. Her pulse oximetry on      room air was 96%. The patient is stable for discharge at this time and      she is to followup with Dr. Mayford Knife as scheduled.  2.  Pneumonia, bilateral:  As discussed above, the patient also had a non-      productive cough and following antibiotics, her symptoms improved. Her      cough resolved as well as the shortness of breath. Her white cell count      today is 6.4, she is afebrile and oxygenating well on room air. She will      be discharged on oral antibiotics and she is to followup with her      primary care physician, Dr. Tiburcio Pea, in 1 week.  3.  Chronic anemia:  The patient's stool guaiac's in the hospital were     negative. She was transfused to keep her hemoglobin and hematocrit about      10 during her  hospital stay. Her hemoglobin and hematocrit the day prior      to discharge is 10.4 and 31.1. She is hemodynamically stable with no  evidence of bleeding.  4.  Lupus/psoriatic arthritis:  The patient was maintained on methotrexate      during her hospital stay.  5.  Diabetes mellitus:  Her Accu-checks were monitored during her hospital      stay. She was on a diabetic diet and covered with sliding scale insulin.  6.  Hypokalemia:  Her potassium was replaced during her hospital stay. Her      potassium the day prior to discharge is 4.4.   DISCHARGE MEDICATIONS:  1.  Ceftin 500 mg 1 p.o. b.i.d. x10 days.  2.  Zithromax 250 mg 1 p.o. q.d. for 3 days.  3.  Lasix 40 mg 1 p.o. q.d.  4.  KCL 20 meq 1 p.o. q.d.  5.  Coreg 3.125 1 p.o. b.i.d.  6.  Humibid DM 1 p.o. b.i.d. for cough.  7.  The patient to discontinue Mobic, Ibuprofen and Motrin.  8.  The patient to continue pre-admission medications except as above.   DIET:  Diabetic, low-salt (2 gram sodium).   FOLLOW UP:  1.  Dr. Leonides Sake in 1 week.  2.  Dr. [name] as scheduled.   CONDITION ON DISCHARGE:  Improve/stable.      ACV/MEDQ  D:  10/01/2004  T:  10/01/2004  Job:  [id number]   cc:   Holley Bouche, M.D.  [address]  [place], [place] [address]  Fax: [phone]   [name], M.D.  [address]  [place], [place] [address]  Fax: [phone]

## 2011-01-05 NOTE — H&P (Signed)
[name], [name] NO.:  [id number]   MEDICAL RECORD NO.:  [id number]          PATIENT TYPE:  OBV   LOCATION:  4707                         FACILITY:  [facility]   PHYSICIAN:  [name], M.D.DATE OF BIRTH:  1946-01-08   DATE OF ADMISSION:  05/21/2006  DATE OF DISCHARGE:                                HISTORY & PHYSICAL   PCP:  Dr. Stacie [name] of [facility].   CHIEF COMPLAINT:  Shortness of breath.   HISTORY OF PRESENT ILLNESS:  The patient is a 65 year old, white female with  a past medical history of CHF, lupus, COPD, and CAD who presents to the  emergency room with shortness of breath.  The patient tells me that she has  been relatively well, although up until about Sunday when she noted that she  had been working very heavily in the past and had not been taking her Lasix  as much as she should. She tried taking her pills but it did not relieve her  symptoms, physical symptoms being shortness of breath, dyspnea on exertion,  nonproductive cough, and increased weight gain.  She was concerned that her  CHF was worsening and she came in to the emergency room for further  evaluation.  In the emergency room, the patient was noted to have a BNP of  250.  Her chest x-ray showed signs of mild edema.  Her white count, however,  was elevated over 12 with shift.  The patient was given a dose of Lasix  which caused significant diuresis and she started feeling better.  She was  also given antibiotics as well.  Currently, the patient denies any  headaches, vision changes, chest pain, palpitations.  She says her shortness  of breath is much, much better closer to baseline.  No abdominal pain.  No  hematuria, dysuria, constipation, diarrhea, focal extremity numbness,  weakness, or pain.   Her review of systems is otherwise negative.   The patient's past medical history includes:  1. 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  2. Lupus with secondary  pneumonitis.  3. COPD.  4. CAD status post MI and stent placement.  5. Type 2 diabetes.  6. Sjogren's disease with psoriasis.  7. History of CHF.  8. She also has a history of some spinal stenosis.   MEDICATIONS:  The patient is on:  1. Vytorin 10/40 p.o. daily.  2. Advair 250 one puff b.i.d.  3. Combivent inhaler 4 puffs daily.  4. Feogen one p.o. daily.  5. Imdur 30 p.o. daily.  6. Methotrexate 15 mg p.o. q. weekly.  7. Cymbalta 40 p.o. daily.  8. Percocet 5/225 one p.o. b.i.d.  9. Xanax 1 mg p.o. p.r.n.  10.Lexapro 10 mg p.o. daily p.r.n.  11.Coreg 3.125 p.o. b.i.d.  12.Lasix 20 p.o. b.i.d.  13.Folic acid 1 mg p.o. daily.  14.Nitrostat 0.4 mg p.r.n.  15.Aspirin 81 p.o. daily.  16.Vicodin 5/500 p.o. p.r.n.  17.Dicyclomine 20 mg p.o. p.r.n.   ALLERGIES:  The patient is allergic to SULFA.   SOCIAL HISTORY:  She smokes about a pack a day.  Denies any heavy alcohol or  drug use.   FAMILY HISTORY:  Noncontributory.   PHYSICAL EXAMINATION:  The patient's vitals on admission:  Temp 99.4.  Heart  rate 103.  Blood pressure 149/92.  After antibiotics and Lasix, she came  down to about 103/59.  Respirations 24.  O2 sat 97% on room air.  GENERAL:  The patient is alert and oriented x3 in no apparent distress.  HEENT:  Normocephalic, atraumatic.  Mucous membranes are moist.  She has no  carotid bruits.  HEART:  Regular rate and rhythm.  A 2/6 systolic ejection murmur.  LUNGS:  She has some mild decreased breath sounds at the bilateral bases.  ABDOMEN:  Soft, nontender, nondistended.  Positive bowel sounds.  EXTREMITIES:  No clubbing, cyanosis.  Trace pitting edema bilaterally.   LAB WORK:  White count 12.8.  H and H 11.2 and 32.1.  MCV of 101.  Platelet  count 220, 84% neutrophils, sodium 141, potassium 3.5, chloride is 108,  bicarb 26, BUN 9, creatinine 1, glucose 145.  LFTs are noted for an albumin  of 3.4, otherwise unremarkable.  BNP is 256.  Cardiac enzymes and troponin  are  normal.  EKG is unremarkable.   ASSESSMENT AND PLAN:  1. Dyspnea on exertion.  This is likely a mild flare-up of chronic      obstructive pulmonary disease along with some mild volume overload for      her congestive heart failure.  We will plan to increase her diuretic      dose as well as treat with Avelox and oxygen.  If the patient is doing      well, we will be able to discharge her tomorrow.  Otherwise,  we will      just continue to follow her labs.  2. Coronary artery disease.  We will continue her other medications      including Coreg and aspirin and Imdur.  3. History of lupus with secondary rheumatologic conditions.  We will      continue her methotrexate as well as folic acid.  4. Depression.  Continue Cymbalta.      [name], M.D.  Electronically Signed     SKK/MEDQ  D:  05/21/2006  T:  05/22/2006  Job:  [id number]   cc:   Diamond Crochet, MD

## 2011-01-05 NOTE — Discharge Summary (Signed)
   [name], [name]                          ACCOUNT NO.:  [id number]   MEDICAL RECORD NO.:  [id number]                   PATIENT TYPE:  INP   LOCATION:  5015                                 FACILITY:  [facility]   PHYSICIAN:  Jackie Plum, M.D.             DATE OF BIRTH:  May 12, 1946   DATE OF ADMISSION:  03/20/2003  DATE OF DISCHARGE:  03/22/2003                                 DISCHARGE SUMMARY   ADDENDUM:  I was notified that this patient had a positive blood culture  result, which grew gram-positive rods in a single blood culture on March 23, 2003.  I notified Dr. Dayton Scrape by voice mail.  Patient was discharged on  Avalox, which should provide adequate coverage for gram-positive rods.      Ellender Hose. Christian Mate, M.D.    SMD/MEDQ  D:  03/24/2003  T:  03/25/2003  Job:  [id number]   cc:   Meredith Staggers, M.D.  [address]  [place]  [place] [address]  Fax: [phone]

## 2011-01-05 NOTE — Cardiovascular Report (Signed)
NAME:  [name], [name]                          ACCOUNT NO.:  [id number]   MEDICAL RECORD NO.:  [id number]                   PATIENT TYPE:  OIB   LOCATION:  2899                                 FACILITY:  [facility]   PHYSICIAN:  Traci R. Mayford Knife, M.D.               DATE OF BIRTH:  1945/12/28   DATE OF PROCEDURE:  04/24/2002  DATE OF DISCHARGE:  04/24/2002                              CARDIAC CATHETERIZATION   PROCEDURE:  Left heart catheterization, coronary angiography, left  ventriculography.   OPERATOR:  Traci R. Mayford Knife, M.D.   INDICATIONS FOR PROCEDURE:  Chest pain and CHF.   COMPLICATIONS:  None.   IV MEDICATIONS:  None.   IV ACCESS:  Via right femoral artery, #6 French sheath.   BRIEF HISTORY:  This is a 65 year old white female with a previous history  of coronary disease status post PTCA stenting of the right coronary artery  with residual LAD disease.  She was recently admitted with a bout of CHF  most likely secondary to profound anemia.  She was diuresed, received blood  transfusions and is feeling much better.  An adenosine Cardiolite done noted  showed some reversible ischemia in the anterior wall.  She now presents for  cardiac catheterization.   DESCRIPTION OF PROCEDURE:  The patient was brought to the cardiac  catheterization laboratory in the fasting nonsedated state.  Informed  consent was obtained.  The patient was connected to continuous heart rate  and pulse oximetry monitoring, intermittent blood pressure monitoring.  The  right groin was prepped and draped in a sterile fashion.  Xylocaine, 1%, was  used for local anesthesia.  Using the modified Seldinger technique, a #6  French sheath was placed in the right femoral artery.  Under fluoroscopic  guidance, a #6 [place] JL4 catheter was placed in the left coronary artery.  Multiple cine films were taken in a 30-degree RAO, 40-degree LAO view.  This  catheter was then exchanged out over a guidewire for a #6  [place] JR4  catheter which was placed under fluoroscopic guidance in the right coronary  artery.  Multiple cine films were taken in a 30-degree RAO, 40-degree LAO  view.  This catheter was then exchanged out over a guidewire for a #6 French  angled pigtail catheter which was placed under fluoroscopic guidance in the  left ventricular cavity.  Left ventriculography was performed in the 30-  degree RAO view using a total of 45 cc of contrast at 13 cc per second.  The  catheter was then pulled back across the aortic valve with no significant  gradient.  At the end of the procedure, all catheters and sheaths were  removed.  Manual compression was performed until adequate hemostasis was  obtained.  The patient was transferred back to the room in stable condition.   RESULTS:  1. The left main coronary artery is widely patent and bifurcates into the  left anterior descending artery and left circumflex artery.  2. The left anterior descending artery gives rise to three diagonal     branches.  The first diagonal branch appears patent.  The second diagonal     branch appears to have a 60% narrowing.  Just after the takeoff of the     second diagonal branch, there is a 40-50% LAD lesion.  The rest of the     LAD traverses to the apex and is patent.  There is also a distal third     diagonal branch which is widely patent.  3. The left circumflex is widely patent throughout the course giving rise to     one high obtuse marginal branch that has a proximal 30%.  Distally the     left circumflex bifurcates into a second obtuse marginal branch which is     patent.  The right coronary artery is widely patent throughout its course     and there is a stent in the proximal portion.  It bifurcates distally and     the posterior descending artery and posterolateral artery both are widely     patent.  4. Left ventriculography performed in the 30-degree RAO view using a total     of 45 cc of contrast at 13 cc  per second showed low normal LV function,     EF 50%, inferior hypokinesis.   PRESSURES:  1. Left ventricular pressure 146/31 mmHg.  2. Aortic pressure 148/77 mmHg.   ASSESSMENT:  1. Nonobstructive coronary disease with widely patent right coronary artery     stent.  2. Low normal left ventricular function with inferior hypokinesis.  3. Recent congestive heart failure secondary to severe anemia.   PLAN:  Discharge to home after IV fluids and bed rest.  Follow up with Dr.  Mayford Knife in 2-3 weeks.                                                Traci R. Mayford Knife, M.D.    TRT/MEDQ  D:  04/24/2002  T:  04/26/2002  Job:  [id number]   cc:   Meredith Staggers, M.D.

## 2011-01-05 NOTE — H&P (Signed)
[name], [name]                ACCOUNT NO.:  [id number]   MEDICAL RECORD NO.:  [id number]          PATIENT TYPE:  INP   LOCATION:  4714                         FACILITY:  [facility]   PHYSICIAN:  Theone Stanley, MD   DATE OF BIRTH:  09/05/1945   DATE OF ADMISSION:  09/24/2004  DATE OF DISCHARGE:                                HISTORY & PHYSICAL   CHIEF COMPLAINT:  Shortness of breath and wheezing.   HISTORY OF PRESENT ILLNESS:  This is a very pleasant 65 year old Caucasian  female with extensive past medical history including coronary artery  disease, CHF, lupus, cirrhotic arthritis, Sjogren's disease, diabetes,  presenting with increasing episodes of wheezing and shortness of breath. The  patient states that Friday night is when she started to notice some  wheezing, some mild shortness of breath. At that point in time she took some  Drituss medication and it kind of improved slightly. Also she took one extra  dose of her Lasix. She noticed that she had increasing fatigue that mainly  occurs, she states, when her hemoglobin goes down. On Saturday she had  another episode of wheezing and shortness of breath. She took Drituss again  and had slight improvement. However, today she had extreme fatigue and  thirst. She had PND, orthopnea, increased abdominal girth with some slight  swelling in her hand. The increased abdominal girth was noted slightly,  starting on Friday also. She had a brief episode of some mild chest  pressure/heaviness, more like she could not breathe. She was given  nitroglycerin and this immediately went away. When I asked whether she had  skipped any medications or had some extra salt in her diet. She states that  she has not, however she had an increase in her Neurontin about two months  ago and her physician, who increased it, stated that this might cause an  increase in her fluid retention.   PAST MEDICAL HISTORY:  1.  Chronic anemia.  2.  Lupus.  3.  MI  with stent placed in the RCA in 2002.  4.  Diabetes, type 2, diet controlled.  5.  Sjogren's disease.  6.  Psoriasis.  7.  Cirrhotic arthritis.  8.  History of COPD.  9.  Osteopenia.  10. History of CHF with recent echocardiogram in February 2005. Summary      showing left ventricular size upper limits of normal. Overall left      ventricular function mildly decreased 40% to 50%, mild diffuse left      ventricular hypokinesis, aortic or valvular regurgitation. Estimated      aortic valve area 1.91.  11. Rheumatoid arthritis.  12. Hyperlipidemia.  13. History of renal insufficiency.  14. Fibromyalgia.  15. Depression.   MEDICATIONS:  1.  Altace 5 mg one p.o. daily.  2.  Methotrexate 2.5 mg six of them in one day; she takes those on Friday.  3.  Toprol-XL 25 mg one p.o. daily.  4.  Ditropan-XL 5 mg one daily.  5.  Risperdal 0.5 mg one p.o. daily.  6.  Folic acid 2 mg two  tabs p.o. daily.  7.  Wellbutrin XL 150 mg one p.o. daily.  8.  Mobic 15 mg one p.o. daily, which she currently is not taking. She is      taking Motrin 800 mg t.i.d. because of cost reasons.  9.  Zocor 40 mg one p.o. daily.  10. Lasix 20 mg two daily.  11. Lexapro 20 mg one p.o. daily.  12. Imdur 30 mg one p.o. daily.  13. FeoGen Forte two tablets one p.o. daily.  14. Aspirin 81 mg daily.  15. Lorazepam.  16. Allegra.  17. Lortab.  18. Dicyclomine 20 mg p.r.n., although lorazepam, Allegra, and Hydrocodone      are p.r.n.  19. Neurontin 300 mg t.i.d.   ALLERGIES:  SULFA--the patient gets hives. She has a mild allergy to LATEX;  if she is around it for an extensive period of time she gets a reaction.  Mild allergy to IV DYE, however she can take it after prednisone and  Benadryl.   FAMILY HISTORY:  Positive for heart disease.   SOCIAL HISTORY:  The patient is retired. Used to work as an Airline pilot.  Disabled due to lupus. No smoking, alcohol, or illicit drug use.   REVIEW OF SYSTEMS:  See HPI.    PHYSICAL EXAMINATION:  VITAL SIGNS: Temperature 98.7, blood pressure 156/63,  pulse 104, respiratory rate 22, saturation on 96% on room air.  HEENT: Head normocephalic and atraumatic. Eyes are 3 mm. Pupils equal,  round, and reactive to light. Extraocular movements intact. Ears reveal no  discharge. Throat clear. No erythema or exudate. Mucosa moist.  NECK: Supple with no lymphadenopathy. No JVD.  HEART: Regular rate and rhythm. No murmurs, rubs, or gallops appreciated.  LUNGS: Mild crackles in the bases.  ABDOMEN: Soft, nontender, nondistended.  EXTREMITIES: No clubbing, cyanosis, or edema.   LABORATORY DATA:  Sodium 141, potassium 2.0, chloride 106, BUN 8, glucose  134. Venous gas showed a pH of 7.48, PCO2 32, bicarbonate 24. Hemoglobin 3,  hematocrit 10.2. white count 11, platelet count 234,000. Troponin less than  0.05. BNP is 489.   Chest x-ray shows mild CHF. EKG shows left bundle branch block, normal sinus  rhythm.   ASSESSMENT/PLAN:  1.  Congestive heart failure exacerbation. The exact etiology is unclear at      this point in time. The only new medication is Neurontin which possibly      could cause this. However, however it is unclear especially since she      has been on it for two months. Will check a TSH, repeat cardiac enzymes      to make sure this is not the cause, and continue her medications. Switch      her Lasix to IV form for diuresis. Place her on telemetry.  2.  Coronary artery disease. Will continue on her medications for this.  3.  Lupus, rheumatoid arthritis, and cirrhosis. She will continue on her      medications for these issues.  4.  Hypokalemia. Will provide potassium supplementation.  5.  Chronic anemia. We will transfuse patient if she does go below 10.  6.  Diabetes. Will start insulin sliding scale and cover for elevated      sugars.      AEJ/MEDQ  D:  09/24/2004  T:  09/24/2004  Job:  [id number]  cc:   Pollyann Savoy, M.D.  [address]  [place], [place] [address]  Fax: [phone]

## 2011-01-05 NOTE — Discharge Summary (Signed)
[name], [name]                ACCOUNT NO.:  [id number]   MEDICAL RECORD NO.:  [id number]          PATIENT TYPE:  INP   LOCATION:  4709                         FACILITY:  [facility]   PHYSICIAN:  Corinna L. Lendell Caprice, MDDATE OF BIRTH:  09/19/1945   DATE OF ADMISSION:  05/21/2008  DATE OF DISCHARGE:  05/25/2008                               DISCHARGE SUMMARY   DISCHARGE DIAGNOSES:  1. Pulmonary embolus.  2. Congestive heart failure secondary to systolic dysfunction,      compensated.  3. Chronic obstructive pulmonary disease, stable.  4. Hypertension.  5. Diabetes.  6. Coronary artery disease.  7. Lupus per report and also rheumatoid arthritis per report.  8. Previous history of smoking.  9. Chronic renal insufficiency.  10.Hyperlipidemia.  11.Depression.   DISCHARGE MEDICATIONS:  Coumadin 7.5 mg nightly and change aspirin to 81  mg a day.  She is to continue rest of her outpatient medications.  Please see H and P, medicine reconciliation form reviewed.  Follow up  with Dr. Mayford Knife in the [facility] in 1-2 days.   CONDITION:  Stable.   ACTIVITY:  Ad lib.   DIET:  Should be Coumadin friendly.   CONSULTATIONS:  None.   PROCEDURES:  None.   LABORATORY DATA:  CBC on admission significant for hemoglobin of 11.6,  hematocrit of 33, otherwise unremarkable.  Hypercoagulable workup  including antithrombin III, lupus anticoagulant, protein C, protein S,  anticardiolipin antibody, beta-2 glycoprotein antibodies, and  prothrombin gene mutation were all unremarkable.  Factor V Leiden  mutation negative.  On admission, she had a PTT of 23, D-dimer 0.74, INR  1.0.  At discharge her INR was 1.9 and she has received an extra dose of  Coumadin tonight.  Her basic metabolic panel was significant for a  creatinine of 1.79.  LFTs unremarkable.  Hemoglobin A1c 6.6.   SPECIAL STUDIES/RADIOLOGY:  V/Q scan showed single moderate unmatched  profusion defect in the lingula, intermediate  probability for acute  pulmonary embolism.  Chest x-ray, two view showed nothing acute.  Doppler of the legs showed no DVT.  EKG showed normal sinus rhythm with  ventricular pacing.   HISTORY AND HOSPITAL COURSE:  Please see H and P for complete details.  [name] is a pleasant 65 year old white female with multiple medical  problems who was sent to the emergency room by Dr. [name] who had  ordered an outpatient V/Q scan.  She had had several months worth of  worsening shortness of breath and had no improvement of her symptoms  with adjustment of her cardiac medications.  She had no chest pain.  She  was found to have an abnormal V/Q scan.  She had had no previous history  of thromboembolism.  Due to her renal insufficiency, she was unable to  get a CT angiogram.  However, clinical suspicion was high.  She was  started on Lovenox and Coumadin.  Dopplers of the legs were negative,  however, her shortness of breath improved steadily through her  hospitalization.  She was felt to have stable COPD and compensated  congestive heart  failure, so she is being treated for a presumed  pulmonary embolus and will need 6 months of Coumadin.      Corinna L. Lendell Caprice, MD  Electronically Signed     CLS/MEDQ  D:  07/21/2008  T:  07/22/2008  Job:  [id number]

## 2011-01-05 NOTE — Discharge Summary (Signed)
NAME:  [name], [name]                          ACCOUNT NO.:  [id number]   MEDICAL RECORD NO.:  [id number]                   PATIENT TYPE:  INP   LOCATION:  5015                                 FACILITY:  [facility]   PHYSICIAN:  Deirdre Peer. Polite, M.D.              DATE OF BIRTH:  1946/04/24   DATE OF ADMISSION:  03/20/2003  DATE OF DISCHARGE:  03/22/2003                                 DISCHARGE SUMMARY   PRIMARY CARE PHYSICIAN:  Meredith Staggers, M.D., [facility].   DISCHARGE DIAGNOSES:  1. Bilateral pneumonia.  2. Macrocytic anemia.  3. Lupus.  4. Psoriatic arthritis.  5. Diabetes mellitus.  6. Coronary artery disease.  7. Hyperlipidemia.  8. Depression.   DISCHARGE MEDICATIONS:  1. Avelox 400 mg daily for seven days.  2. Patient instructed to resume home medications as previously prescribed.   PROCEDURES:  1. Chest x-ray August 1st:  No new bilateral infiltrates or edema with mild     cardiomegaly.  2. A 12-lead EKG August 1st:  Sinus tachycardia, ventricular rate 112.   LABORATORY DATA:  WBC 16.7, RBC 2.65, hemoglobin 9.3, hematocrit 27.2, MCV  102.7, platelets 242.  Sodium 138, potassium 3.7, chloride 105, CO2 of 26,  glucose 212, BUN 8, creatinine 1, calcium 8.6.  Hemoglobin A1C 7, __________  13, vitamin B12 of 581.   DISPOSITION:  The patient is being discharged to home.   CONDITION ON DISCHARGE:  Stable.   HISTORY OF PRESENT ILLNESS:  A 65 year old female with a past medical  history significant for lupus, diabetes, and coronary artery disease who  presented with a three-day history of shortness of breath and productive  sputum.  The patient reported that over the past few days she had been  getting increasingly short of breath with minimal activity.  She also had a  cough which we felt was more upper respiratory with some phlegm but not  discolored.  She denied any associated fever or chills.  She says she has  had this problem with shortness of  breath and was treated for over nine  weeks with antibiotics by Dr. Dayton Scrape who felt that it was secondary to some  type of upper respiratory infection.  She just recently completed her  medication, but she is unsure which antibiotics she was on for that.  Initial examination noted her to be slightly febrile, with a temperature of  99.1.  O2 saturation was 93% on room air.  Lungs had fine, dry crackles in  both upper fields.  Her initial laboratory data noted an elevated white cell  count of 17.6.  A chest x-ray revealed bilateral central infiltrate/edema,  cardiac enlargement, and no effusion.  She was admitted for further  evaluation and treatment.    HOSPITAL COURSE:  1. Bilateral pneumonia.  She was admitted and started on IV antibiotics.     Sputum for gram stain and culture  was sent.  Antibiotics were changed to     by mouth.  She remained afebrile.  At discharge, her lung sounds were     clear.  She was being discharged on an additional five days to complete a     seven-day course of antibiotic therapy.  2. Macrocytic anemia.  The patient was noted to be anemic which she says is     her baseline.  Due to an elevated MCV, a B12 was obtained which was     normal.  3. Psoriatic arthritis.  The patient was previously on methotrexate which     was held during this hospitalization.  The patient was instructed to     restart her medication as previously prescribed.  4. Diabetes mellitus.  The patient has a history of diet-controlled diabetes     mellitus.  A hemoglobin A1C revealed a value of 7.  No further changes     were made.  5. Coronary artery disease.  Was stable during this hospitalization.  6. Tobacco abuse.  The patient was advised to stop smoking.   DISCHARGE INSTRUCTIONS:  The patient has been instructed to complete her  antibiotics and to follow up with her primary care physician in one week.      Stephanie Swazil[place], NP                      Deirdre Peer. Polite, M.D.     [name]  D:  03/22/2003  T:  03/23/2003  Job:  [id number]   cc:   Meredith Staggers, M.D.  [address]  [place]  [place] [address]  Fax: [phone]   Deirdre Peer. Polite, M.D.  [address]  [place], [place] [address]  Fax: [phone]

## 2011-01-05 NOTE — H&P (Signed)
NAMEBETTA, BALLA NO.:  [id number]   MEDICAL RECORD NO.:  [id number]          PATIENT TYPE:  INP   LOCATION:  3735                         FACILITY:  [facility]   PHYSICIAN:  Jackie Plum, M.D.DATE OF BIRTH:  [birth year]   DATE OF ADMISSION:  09/01/2006  DATE OF DISCHARGE:                              HISTORY & PHYSICAL   CHIEF COMPLAINT:  Shortness of breath.   HISTORY OF PRESENT ILLNESS:  The patient is a 65 year old Caucasian lady  with history of bronchial asthma, CHF, dyslipidemia, psoriasis and  hypertension.  She had been in her usual state of health until about a  week ago, when she started to notice productive cough and her cough has  been productive of yellowish sputum.  This was followed with shortness  of breath and wheezing.  She therefore came to the ED and was seen  earlier in the day and discharged home after IV antibiotics with  prescription to buy p.o. antibiotics for her discharge diagnosis of  pneumonia.  However, the patient indicates that when she went home, she  felt very poorly and could not do anything for herself, feeling so weak,  and therefore came back to the ED.  In the emergency room, the patient  was noted to be wheezing.  She received some nebulizers and antibiotics  were given and she was admitted for further management of pneumonia with  bronchial asthma exacerbation.  The patient denies any chest pain.  She  denies any PND or orthopnea.  No palpitations.  No dizziness.  She feels  generally weak.  She had been having some fever.  No chills.  She denies  any increasing calf or leg pains.   PAST MEDICAL HISTORY:  As noted above.   MEDICATION HISTORY:   ALLERGIES:  The patient is allergic to SULFONAMIDES.   CURRENT MEDICINES:  Include Vytorin, Advair, Combivent, methotrexate,  Altace, Cymbalta, Percocet, Ativan, Flexeril, Coreg, Lasix, folic acid,  Vicodin and aspirin.   FAMILY HISTORY:  Positive for hypertension.   SOCIAL HISTORY:  The patient denies any history of cigarette smoking.   REVIEW OF SYSTEMS:  As noted above and otherwise unremarkable.   PHYSICAL EXAMINATION:  VITAL SIGNS:  BP 153/66, pulse 100 per minute,  respirations 20.  Temperature 99.2 degrees Fahrenheit.  O2 SAT of 98% on  oxygen by nasal cannula.  GENERAL:  She is not in acute cardiopulmonary distress; however, she  looked generally weak.  HEENT:  Normocephalic, atraumatic.  Pupils are equal, round and reactive  to light.  Extraocular movements intact.  Oropharynx moist.  NECK:  Supple with no JVD.  LUNGS:  Bilateral rhonchi and wheezes.  CARDIAC:  The patient was tachycardic, but rhythm was regular.  No  gallops.  ABDOMEN:  Obese, soft and nontender.  Bowel sounds present.  EXTREMITIES:  No cyanosis.  No edema.  CNS:  Exam was nonfocal.   LABORATORY AND ACCESSORY CLINICAL DATA:  X-ray of the chest showed right  pneumonic infiltrate.   EKG shows sinus tachycardia with left bundle branch block of unclear  age.   Point-of-care cardiac  markers were negative.  Sodium __________,  potassium 3.1, chloride 102, CO2 28, glucose 130, BUN 9, creatinine 0.9,  calcium 9.0, total protein 6.9, albumin 3.6, AST 20, ALT 13, alkaline  phosphatase 91, bilirubin 0.5.  WBC count 9.6, hemoglobin 11.2,  hematocrit 32.5, platelet count 198,000.   IMPRESSION:  1. Community-acquired pneumonia.  2. Bronchial asthma exacerbation secondary to community-acquired      pneumonia.  3. Hypokalemia.   PLAN:  The patient is admitted for IV antibiotics, pulmonary toileting,  spirometric nebulizers, oxygen supplementation.  We will need to  continue her home medications after clarifying the dosages and frequency  of administration of her medication regimen.  We will get followup labs  in the morning.      Jackie Plum, M.D.  Electronically Signed     GO/MEDQ  D:  09/02/2006  T:  09/02/2006  Job:  [id number]   cc:   Joycelyn Rua, M.D.

## 2011-01-05 NOTE — Consult Note (Signed)
. [facility]  Patient:    [name], [name]                       MRN: [id number] Proc. Date: 11/14/00 Adm. Date:  [id number] Attending:  Meade Maw A CC:         Meade Maw, M.D., [facility] Cardiology  CVTS Office   Consultation Report  REASON FOR CONSULTATION:  Coronary artery disease and recent inferior wall MI.  PHYSICIAN REQUESTING CONSULTATION:  Meade Maw, M.D.  PRIMARY CARE PHYSICIAN:  Meredith Staggers, M.D. of [facility].  CHIEF COMPLAINT:  Shortness of breath and interscapular pain.  REASON FOR CONSULTATION:  I was asked to see this pleasant 65 year old white female in consultation by Dr. Meade Maw for evaluation of her coronary artery disease and recent inferior wall MI.  HISTORY OF PRESENT ILLNESS:  The patient has had some chest pain radiating to the interscapular area for the past few weeks with dyspnea on exertion.  She has had similar symptoms in the past from her lupus and diffuse body pain and she was treated with anti-inflammatories.  She was evaluated by Dr. [name] in the [facility] Cardiology office on March 26th, at which time a 2-D echo and stress test were scheduled.  The stress test showed lateral ischemia with ejection fraction of approximately 35%.  The 2-D echo showed ejection fraction of 30-40% with estimated pulmonary artery systolic pressure of 50 mmHg, without significant aortic or mitral insufficiency but with mild-to-moderate tricuspid regurgitation with some inferior wall hypokinesia. She was subsequently scheduled for cardiac catheterization, which was performed by Dr. Fraser Din today, which demonstrated total occlusion of the right coronary artery and proximal 70% stenosis of the LAD, with a non-dominant but open circumflex and possible mild 20% ostial left main stenosis.  The RCA was angioplastied and stented open to TIMI-3 flow with an excellent angiographic result.  She is now stable on  Integrelin and heparin.  PAST MEDICAL HISTORY: 1. Lupus, followed by Dr. Demetria Pore. Levitin. 2. Depression. 3. Surgical history positive for hysterectomy in 1972 and bilateral    mastectomies in 1983 for fibrocystic disease.  ALLERGIES:  SULFA.  CURRENT MEDICATIONS: 1. Premarin 0.625 mg q.d. 2. Prozac 40 mg q.d. 3. Vioxx p.r.n. 4. Prevacid 30 mg q.d. 5. Neurontin 100 mg t.i.d. 6. Lasix 40 mg q.d.  SOCIAL HISTORY:  She is single without children and stopped smoking recently. She uses alcohol occasionally.  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  FAMILY HISTORY:  Father died of coronary artery disease.  Mother is alive with Alzheimers.  REVIEW OF SYSTEMS:  She has had significant pulmonary disease and was in fact admitted to [facility] last year and required mechanical ventilation for five days.  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  She had been followed here in [facility] by Dr. Danice Goltz for her pulmonary disease and was recently released, as she appears to have recovered.  She is not currently smoking.  Otherwise, she is right-hand dominant.  She denies any symptoms of TIA, claudication or DVT.  She denies any bleeding diathesis or easy bruisability.  She has gained weight on steroids, which also cause her to have a psychotic reaction.  PHYSICAL EXAMINATION:  VITAL SIGNS:  She is 5 feet 5 inches and weighs 185 pounds.  Her blood pressure is 96/60, heart rate is 80 and regular.  GENERAL:  General appearance is that of a pleasant, middle-aged overweight white female in her hospital room following cardiac catheterization with her right leg immobilized and a compression dressing on the groin.  She is having no chest pain.  HEENT:  Good dentition.  Pharynx clear.  EOMs normal.  NECK:  Supple without carotid bruit, mass or thyromegaly.  LUNGS:  Clear breath  sounds bilaterally.  CARDIAC:  Normal sinus rhythm with a faint 2/6 systolic murmur which may be either due to tricuspid regurgitation or mitral regurgitation.  ABDOMEN:  Soft, nontender, without abdominal bruit.  EXTREMITIES:  Extremities have 2+ pulses in both radial and pedal regions. Extremities are without tender swollen joints and there is no significant pedal edema.  SKIN:  Clear, warm and dry.  She does have mastectomy incisions bilaterally and a lower abdominal incision from the hysterectomy.  NEUROLOGIC:  Alert and oriented x 3 with full motor function as can be tested while she is restricted to bedrest following cath.  LABORATORY DATA:  I have reviewed her cardiac cath and angiograms.  Her chest x-ray is not available and there are no recent pulmonary function tests available.  There is no old medical record available for review of the lupus data.  IMPRESSION:  Her situation at this time is with an open right coronary, normal circumflex, mild reduction in ventricular function and approximately 70% left anterior descending stenosis, with a stress test done last week showing no anterior wall ischemia.  I do not feel the mitral regurgitation is a surgical issue at this time and the amount of mild regurgitation seen at catheterization today was probably due to the recent myocardial infarction and should improve with the right coronary now open.  I would not recommend coronary bypass surgery for the patient with single-vessel disease at this time and would consider surgery if the right coronary cannot stay open with current management.  I discussed this recommendation with the patient and she understands. DD:  11/15/00 TD:  11/15/00 Job: [id number]

## 2011-02-21 ENCOUNTER — Emergency Department (HOSPITAL_COMMUNITY)
Admission: EM | Admit: 2011-02-21 | Discharge: 2011-02-21 | Disposition: A | Discharge status: Home / Self Care | Payer: Medicare Other | Attending: Emergency Medicine | Admitting: Emergency Medicine

## 2011-02-21 ENCOUNTER — Emergency Department (HOSPITAL_COMMUNITY): Payer: Medicare Other

## 2011-02-21 DIAGNOSIS — W010XXA Fall on same level from slipping, tripping and stumbling without subsequent striking against object, initial encounter: Secondary | ICD-10-CM | POA: Insufficient documentation

## 2011-02-21 DIAGNOSIS — E119 Type 2 diabetes mellitus without complications: Secondary | ICD-10-CM | POA: Insufficient documentation

## 2011-02-21 DIAGNOSIS — Z95810 Presence of automatic (implantable) cardiac defibrillator: Secondary | ICD-10-CM | POA: Insufficient documentation

## 2011-02-21 DIAGNOSIS — M25439 Effusion, unspecified wrist: Secondary | ICD-10-CM | POA: Insufficient documentation

## 2011-02-21 DIAGNOSIS — J45909 Unspecified asthma, uncomplicated: Secondary | ICD-10-CM | POA: Insufficient documentation

## 2011-02-21 DIAGNOSIS — Z86718 Personal history of other venous thrombosis and embolism: Secondary | ICD-10-CM | POA: Insufficient documentation

## 2011-02-21 DIAGNOSIS — I10 Essential (primary) hypertension: Secondary | ICD-10-CM | POA: Insufficient documentation

## 2011-02-21 DIAGNOSIS — Z79899 Other long term (current) drug therapy: Secondary | ICD-10-CM | POA: Insufficient documentation

## 2011-02-21 DIAGNOSIS — M25539 Pain in unspecified wrist: Secondary | ICD-10-CM | POA: Insufficient documentation

## 2011-02-21 DIAGNOSIS — I252 Old myocardial infarction: Secondary | ICD-10-CM | POA: Insufficient documentation

## 2011-02-21 DIAGNOSIS — S63509A Unspecified sprain of unspecified wrist, initial encounter: Secondary | ICD-10-CM | POA: Insufficient documentation

## 2011-02-21 DIAGNOSIS — Y92009 Unspecified place in unspecified non-institutional (private) residence as the place of occurrence of the external cause: Secondary | ICD-10-CM | POA: Insufficient documentation

## 2011-02-21 DIAGNOSIS — I509 Heart failure, unspecified: Secondary | ICD-10-CM | POA: Insufficient documentation

## 2011-02-21 DIAGNOSIS — M329 Systemic lupus erythematosus, unspecified: Secondary | ICD-10-CM | POA: Insufficient documentation

## 2011-03-01 ENCOUNTER — Encounter: Payer: Self-pay | Admitting: Internal Medicine

## 2011-03-07 ENCOUNTER — Encounter: Payer: Self-pay | Admitting: Internal Medicine

## 2011-04-04 ENCOUNTER — Encounter: Payer: Self-pay | Admitting: Internal Medicine

## 2011-04-10 ENCOUNTER — Encounter: Payer: Self-pay | Admitting: Internal Medicine

## 2011-05-14 LAB — CBC
HCT: 34.2 — ABNORMAL LOW
MCV: 90.2
RBC: 3.79 — ABNORMAL LOW
WBC: 8.7

## 2011-05-14 LAB — I-STAT 8, (EC8 V) (CONVERTED LAB)
BUN: 21
Bicarbonate: 25.4 — ABNORMAL HIGH
Chloride: 106
Glucose, Bld: 111 — ABNORMAL HIGH
Hemoglobin: 12.6
Sodium: 139

## 2011-05-14 LAB — POCT CARDIAC MARKERS
CKMB, poc: 1.4
Myoglobin, poc: 82.3

## 2011-05-14 LAB — DIFFERENTIAL
Eosinophils Absolute: 0.2
Lymphs Abs: 1.6
Monocytes Relative: 11
Neutrophils Relative %: 68

## 2011-05-21 LAB — CBC
HCT: 34.5 — ABNORMAL LOW
HCT: 36.1
Hemoglobin: 11.3 — ABNORMAL LOW
Hemoglobin: 11.8 — ABNORMAL LOW
MCV: 92.5
Platelets: 194
Platelets: 198
RBC: 3.5 — ABNORMAL LOW
RBC: 3.81 — ABNORMAL LOW
WBC: 6.2
WBC: 6.6
WBC: 7
WBC: 7.5

## 2011-05-21 LAB — HEMOGLOBIN A1C: Mean Plasma Glucose: 143

## 2011-05-21 LAB — PROTEIN S ACTIVITY: Protein S Activity: 99 % (ref 69–129)

## 2011-05-21 LAB — DIFFERENTIAL
Basophils Relative: 1
Eosinophils Absolute: 0.1
Eosinophils Relative: 1
Lymphs Abs: 1

## 2011-05-21 LAB — COMPREHENSIVE METABOLIC PANEL
ALT: 13
AST: 17
Alkaline Phosphatase: 93
CO2: 25
Chloride: 101
GFR calc Af Amer: 35 — ABNORMAL LOW
GFR calc non Af Amer: 29 — ABNORMAL LOW
Glucose, Bld: 107 — ABNORMAL HIGH
Potassium: 3.9
Sodium: 135
Total Bilirubin: 0.8

## 2011-05-21 LAB — ANTITHROMBIN III: AntiThromb III Func: 113 (ref 76–126)

## 2011-05-21 LAB — GLUCOSE, CAPILLARY: Glucose-Capillary: 156 — ABNORMAL HIGH

## 2011-05-21 LAB — BASIC METABOLIC PANEL
BUN: 26 — ABNORMAL HIGH
GFR calc non Af Amer: 30 — ABNORMAL LOW
GFR calc non Af Amer: 30 — ABNORMAL LOW
Potassium: 3.8
Potassium: 4.6
Sodium: 138
Sodium: 139

## 2011-05-21 LAB — FACTOR 5 LEIDEN

## 2011-05-21 LAB — D-DIMER, QUANTITATIVE: D-Dimer, Quant: 0.74 — ABNORMAL HIGH

## 2011-05-21 LAB — PROTIME-INR
INR: 1
INR: 1.6 — ABNORMAL HIGH
INR: 1.7 — ABNORMAL HIGH
INR: 1.9 — ABNORMAL HIGH
Prothrombin Time: 12.9
Prothrombin Time: 19.4 — ABNORMAL HIGH
Prothrombin Time: 22.9 — ABNORMAL HIGH

## 2011-05-21 LAB — CARDIOLIPIN ANTIBODIES, IGG, IGM, IGA: Anticardiolipin IgG: <7 — ABNORMAL LOW (ref <11)

## 2011-05-21 LAB — LUPUS ANTICOAGULANT PANEL

## 2011-05-21 LAB — PROTEIN C ACTIVITY: Protein C Activity: 157 % — ABNORMAL HIGH (ref 75–133)

## 2011-05-21 LAB — HOMOCYSTEINE: Homocysteine: 16.4 — ABNORMAL HIGH

## 2011-05-25 LAB — DIFFERENTIAL
Basophils Absolute: 0
Basophils Absolute: 0
Basophils Relative: 0
Basophils Relative: 0
Eosinophils Absolute: 0 — ABNORMAL LOW
Eosinophils Relative: 0
Monocytes Absolute: 0.7
Monocytes Relative: 4
Neutro Abs: 11.7 — ABNORMAL HIGH
Neutrophils Relative %: 90 — ABNORMAL HIGH

## 2011-05-25 LAB — BASIC METABOLIC PANEL
BUN: 25 — ABNORMAL HIGH
BUN: 25 — ABNORMAL HIGH
BUN: 26 — ABNORMAL HIGH
BUN: 27 — ABNORMAL HIGH
BUN: 28 — ABNORMAL HIGH
BUN: 30 — ABNORMAL HIGH
BUN: 32 — ABNORMAL HIGH
CO2: 24
CO2: 27
CO2: 27
CO2: 28
CO2: 30
CO2: 32
Calcium: 8.6
Calcium: 8.8
Calcium: 8.9
Calcium: 9
Calcium: 9.1
Calcium: 9.1
Calcium: 9.2
Calcium: 9.2
Calcium: 9.2
Chloride: 103
Creatinine, Ser: 1.12
Creatinine, Ser: 1.25 — ABNORMAL HIGH
Creatinine, Ser: 1.27 — ABNORMAL HIGH
Creatinine, Ser: 1.29 — ABNORMAL HIGH
Creatinine, Ser: 1.41 — ABNORMAL HIGH
Creatinine, Ser: 1.42 — ABNORMAL HIGH
Creatinine, Ser: 1.46 — ABNORMAL HIGH
GFR calc Af Amer: 44 — ABNORMAL LOW
GFR calc Af Amer: 46 — ABNORMAL LOW
GFR calc Af Amer: 46 — ABNORMAL LOW
GFR calc Af Amer: 51 — ABNORMAL LOW
GFR calc Af Amer: 52 — ABNORMAL LOW
GFR calc Af Amer: 60 — ABNORMAL LOW
GFR calc non Af Amer: 33 — ABNORMAL LOW
GFR calc non Af Amer: 38 — ABNORMAL LOW
GFR calc non Af Amer: 38 — ABNORMAL LOW
GFR calc non Af Amer: 39 — ABNORMAL LOW
GFR calc non Af Amer: 43 — ABNORMAL LOW
GFR calc non Af Amer: 43 — ABNORMAL LOW
GFR calc non Af Amer: 44 — ABNORMAL LOW
Glucose, Bld: 100 — ABNORMAL HIGH
Glucose, Bld: 112 — ABNORMAL HIGH
Glucose, Bld: 115 — ABNORMAL HIGH
Glucose, Bld: 160 — ABNORMAL HIGH
Glucose, Bld: 186 — ABNORMAL HIGH
Potassium: 2.8 — ABNORMAL LOW
Potassium: 2.9 — ABNORMAL LOW
Potassium: 3 — ABNORMAL LOW
Potassium: 3.1 — ABNORMAL LOW
Potassium: 3.4 — ABNORMAL LOW
Potassium: 3.8
Sodium: 137
Sodium: 138
Sodium: 139
Sodium: 148 — ABNORMAL HIGH
Sodium: 150 — ABNORMAL HIGH
Sodium: 152 — ABNORMAL HIGH
Sodium: 153 — ABNORMAL HIGH

## 2011-05-25 LAB — CBC
HCT: 23.6 — ABNORMAL LOW
HCT: 25.9 — ABNORMAL LOW
HCT: 26.9 — ABNORMAL LOW
HCT: 27.1 — ABNORMAL LOW
HCT: 30.7 — ABNORMAL LOW
Hemoglobin: 10.3 — ABNORMAL LOW
Hemoglobin: 8 — ABNORMAL LOW
Hemoglobin: 8.5 — ABNORMAL LOW
Hemoglobin: 9.2 — ABNORMAL LOW
MCHC: 33.5
MCHC: 33.5
MCHC: 33.6
MCV: 91.2
MCV: 91.4
Platelets: 173
Platelets: 180
Platelets: 205
Platelets: 220
Platelets: 226
Platelets: 256
RBC: 2.59 — ABNORMAL LOW
RBC: 2.84 — ABNORMAL LOW
RBC: 3.07 — ABNORMAL LOW
RBC: 3.35 — ABNORMAL LOW
RDW: 14.8
RDW: 16.1 — ABNORMAL HIGH
RDW: 16.9 — ABNORMAL HIGH
RDW: 17 — ABNORMAL HIGH
WBC: 16.4 — ABNORMAL HIGH
WBC: 6.5
WBC: 6.6
WBC: 7.9
WBC: 8.2
WBC: 9.9

## 2011-05-25 LAB — COMPREHENSIVE METABOLIC PANEL
ALT: 14
AST: 20
AST: 44 — ABNORMAL HIGH
Albumin: 2.8 — ABNORMAL LOW
Albumin: 2.9 — ABNORMAL LOW
Alkaline Phosphatase: 63
Alkaline Phosphatase: 69
Alkaline Phosphatase: 75
BUN: 17
CO2: 24
CO2: 25
Chloride: 105
Chloride: 108
GFR calc Af Amer: 47 — ABNORMAL LOW
GFR calc Af Amer: 53 — ABNORMAL LOW
GFR calc non Af Amer: 42 — ABNORMAL LOW
Glucose, Bld: 153 — ABNORMAL HIGH
Potassium: 3 — ABNORMAL LOW
Potassium: 3.4 — ABNORMAL LOW
Potassium: 3.5
Sodium: 136
Sodium: 138
Total Bilirubin: 0.8
Total Bilirubin: 0.9
Total Bilirubin: 1
Total Protein: 5.5 — ABNORMAL LOW
Total Protein: 5.7 — ABNORMAL LOW

## 2011-05-25 LAB — POCT I-STAT 3, ART BLOOD GAS (G3+)
O2 Saturation: 97
TCO2: 24
pCO2 arterial: 39.5
pH, Arterial: 7.37
pO2, Arterial: 92

## 2011-05-25 LAB — BLOOD GAS, ARTERIAL
Acid-Base Excess: 1.3
Acid-base deficit: 0.5
Drawn by: 277331
Drawn by: 284031
Drawn by: 29017
FIO2: 60
MECHVT: 390
MECHVT: 390
MECHVT: 510
O2 Saturation: 100
PEEP: 12
PEEP: 8
Patient temperature: 98.6
Patient temperature: 98.6
RATE: 12
RATE: 28
RATE: 28
RATE: 28
pCO2 arterial: 35.6
pCO2 arterial: 36
pCO2 arterial: 41.4
pH, Arterial: 7.406 — ABNORMAL HIGH
pH, Arterial: 7.408 — ABNORMAL HIGH
pH, Arterial: 7.426 — ABNORMAL HIGH
pH, Arterial: 7.451 — ABNORMAL HIGH
pO2, Arterial: 182 — ABNORMAL HIGH

## 2011-05-25 LAB — CROSSMATCH: Antibody Screen: NEGATIVE

## 2011-05-25 LAB — ANA: Anti Nuclear Antibody(ANA): POSITIVE — AB

## 2011-05-25 LAB — LIPID PANEL: VLDL: 20

## 2011-05-25 LAB — B-NATRIURETIC PEPTIDE (CONVERTED LAB)
Pro B Natriuretic peptide (BNP): 221 — ABNORMAL HIGH
Pro B Natriuretic peptide (BNP): 234 — ABNORMAL HIGH
Pro B Natriuretic peptide (BNP): 278 — ABNORMAL HIGH
Pro B Natriuretic peptide (BNP): 376 — ABNORMAL HIGH
Pro B Natriuretic peptide (BNP): 504 — ABNORMAL HIGH
Pro B Natriuretic peptide (BNP): 578 — ABNORMAL HIGH

## 2011-05-25 LAB — TSH: TSH: 0.648

## 2011-05-25 LAB — CULTURE, BLOOD (ROUTINE X 2)
Culture: NO GROWTH
Culture: NO GROWTH

## 2011-05-25 LAB — HEMOGLOBIN A1C
Hgb A1c MFr Bld: 6
Mean Plasma Glucose: 136

## 2011-05-25 LAB — APTT
aPTT: 22 — ABNORMAL LOW
aPTT: 26

## 2011-05-25 LAB — PROTIME-INR
INR: 0.9
Prothrombin Time: 13.3

## 2011-05-28 LAB — BASIC METABOLIC PANEL
BUN: 18
BUN: 26 — ABNORMAL HIGH
CO2: 29
CO2: 29
CO2: 30
Calcium: 8.1 — ABNORMAL LOW
Calcium: 8.3 — ABNORMAL LOW
Chloride: 101
Chloride: 103
Creatinine, Ser: 1.16
Creatinine, Ser: 1.16
Creatinine, Ser: 1.22 — ABNORMAL HIGH
GFR calc Af Amer: 54 — ABNORMAL LOW
GFR calc Af Amer: 57 — ABNORMAL LOW
GFR calc non Af Amer: 47 — ABNORMAL LOW
Glucose, Bld: 82
Glucose, Bld: 92
Sodium: 137

## 2011-05-28 LAB — IRON AND TIBC: Iron: 62

## 2011-05-28 LAB — BLOOD GAS, ARTERIAL
Acid-Base Excess: 0.5
Bicarbonate: 23.8
TCO2: 24.8
pCO2 arterial: 28.7 — ABNORMAL LOW
pH, Arterial: 7.514 — ABNORMAL HIGH
pO2, Arterial: 58.5 — ABNORMAL LOW

## 2011-05-28 LAB — CROSSMATCH
ABO/RH(D): B POS
Antibody Screen: NEGATIVE

## 2011-05-28 LAB — CARDIAC PANEL(CRET KIN+CKTOT+MB+TROPI)
Relative Index: INVALID
Total CK: 34
Troponin I: 0.03

## 2011-05-28 LAB — CBC
MCHC: 33.5
MCHC: 33.5
MCV: 89.6
MCV: 90.1
Platelets: 210
Platelets: 254
RBC: 2.88 — ABNORMAL LOW
RBC: 3.39 — ABNORMAL LOW
RDW: 15
WBC: 15.6 — ABNORMAL HIGH

## 2011-05-28 LAB — VITAMIN B12: Vitamin B-12: 687 (ref 211–911)

## 2011-05-29 LAB — HEMOGLOBIN A1C
Hgb A1c MFr Bld: 7.2 — ABNORMAL HIGH
Hgb A1c MFr Bld: 7.4 — ABNORMAL HIGH

## 2011-05-29 LAB — CARDIAC PANEL(CRET KIN+CKTOT+MB+TROPI)
CK, MB: 1.4
CK, MB: 2
CK, MB: 3.7
Relative Index: INVALID
Relative Index: INVALID
Relative Index: INVALID
Relative Index: INVALID
Relative Index: INVALID
Total CK: 19
Total CK: 20
Total CK: 22
Total CK: 30
Total CK: 36
Total CK: 42
Troponin I: 0.04
Troponin I: 0.08 — ABNORMAL HIGH
Troponin I: 0.16 — ABNORMAL HIGH
Troponin I: 0.18 — ABNORMAL HIGH
Troponin I: 0.22 — ABNORMAL HIGH

## 2011-05-29 LAB — BASIC METABOLIC PANEL
BUN: 12
BUN: 12
BUN: 22
BUN: 35 — ABNORMAL HIGH
BUN: 37 — ABNORMAL HIGH
BUN: 53 — ABNORMAL HIGH
BUN: 55 — ABNORMAL HIGH
BUN: 58 — ABNORMAL HIGH
BUN: 59 — ABNORMAL HIGH
BUN: 9
CO2: 24
CO2: 25
CO2: 25
CO2: 26
CO2: 28
CO2: 30
CO2: 33 — ABNORMAL HIGH
CO2: 34 — ABNORMAL HIGH
CO2: 34 — ABNORMAL HIGH
CO2: 36 — ABNORMAL HIGH
Calcium: 8.1 — ABNORMAL LOW
Calcium: 8.2 — ABNORMAL LOW
Calcium: 8.2 — ABNORMAL LOW
Calcium: 8.5
Calcium: 8.5
Calcium: 8.6
Calcium: 8.6
Calcium: 8.8
Calcium: 8.8
Chloride: 100
Chloride: 101
Chloride: 104
Chloride: 106
Chloride: 108
Chloride: 109
Chloride: 109
Chloride: 111
Creatinine, Ser: 0.97
Creatinine, Ser: 1
Creatinine, Ser: 1
Creatinine, Ser: 1.31 — ABNORMAL HIGH
Creatinine, Ser: 1.32 — ABNORMAL HIGH
Creatinine, Ser: 1.32 — ABNORMAL HIGH
Creatinine, Ser: 1.75 — ABNORMAL HIGH
Creatinine, Ser: 1.76 — ABNORMAL HIGH
Creatinine, Ser: 1.8 — ABNORMAL HIGH
Creatinine, Ser: 1.83 — ABNORMAL HIGH
Creatinine, Ser: 1.86 — ABNORMAL HIGH
Creatinine, Ser: 1.91 — ABNORMAL HIGH
Creatinine, Ser: 1.92 — ABNORMAL HIGH
GFR calc Af Amer: 32 — ABNORMAL LOW
GFR calc Af Amer: 32 — ABNORMAL LOW
GFR calc Af Amer: 33 — ABNORMAL LOW
GFR calc Af Amer: 36 — ABNORMAL LOW
GFR calc Af Amer: 36 — ABNORMAL LOW
GFR calc Af Amer: >60
GFR calc non Af Amer: 27 — ABNORMAL LOW
GFR calc non Af Amer: 27 — ABNORMAL LOW
GFR calc non Af Amer: 41 — ABNORMAL LOW
GFR calc non Af Amer: 44 — ABNORMAL LOW
GFR calc non Af Amer: 56 — ABNORMAL LOW
GFR calc non Af Amer: 58 — ABNORMAL LOW
Glucose, Bld: 113 — ABNORMAL HIGH
Glucose, Bld: 123 — ABNORMAL HIGH
Glucose, Bld: 125 — ABNORMAL HIGH
Glucose, Bld: 131 — ABNORMAL HIGH
Glucose, Bld: 140 — ABNORMAL HIGH
Glucose, Bld: 140 — ABNORMAL HIGH
Glucose, Bld: 163 — ABNORMAL HIGH
Glucose, Bld: 221 — ABNORMAL HIGH
Glucose, Bld: 86
Glucose, Bld: 89
Potassium: 3.2 — ABNORMAL LOW
Potassium: 3.9
Potassium: 4
Sodium: 142
Sodium: 149 — ABNORMAL HIGH
Sodium: 152 — ABNORMAL HIGH
Sodium: 153 — ABNORMAL HIGH

## 2011-05-29 LAB — POCT I-STAT 3, ART BLOOD GAS (G3+)
Acid-Base Excess: 1
Acid-Base Excess: 12 — ABNORMAL HIGH
Acid-Base Excess: 16 — ABNORMAL HIGH
Acid-Base Excess: 2
Acid-base deficit: 1
Acid-base deficit: 1
Acid-base deficit: 4 — ABNORMAL HIGH
Acid-base deficit: 5 — ABNORMAL HIGH
Bicarbonate: 23.4
Bicarbonate: 24.6 — ABNORMAL HIGH
Bicarbonate: 25.5 — ABNORMAL HIGH
Bicarbonate: 25.8 — ABNORMAL HIGH
Bicarbonate: 26 — ABNORMAL HIGH
Bicarbonate: 28 — ABNORMAL HIGH
Bicarbonate: 34.4 — ABNORMAL HIGH
Bicarbonate: 39.1 — ABNORMAL HIGH
O2 Saturation: 100
O2 Saturation: 100
O2 Saturation: 96
O2 Saturation: 96
O2 Saturation: 97
O2 Saturation: 97
O2 Saturation: 97
O2 Saturation: 98
O2 Saturation: 99
Operator id: 223531
Operator id: 236041
Operator id: 248711
Operator id: 273391
Operator id: 290241
Patient temperature: 97.4
Patient temperature: 97.9
Patient temperature: 97.9
Patient temperature: 97.9
Patient temperature: 98.2
Patient temperature: 98.6
TCO2: 25
TCO2: 26
TCO2: 27
TCO2: 27
TCO2: 28
TCO2: 29
TCO2: 35
pCO2 arterial: 40.2
pCO2 arterial: 53.8 — ABNORMAL HIGH
pH, Arterial: 7.264 — ABNORMAL LOW
pH, Arterial: 7.45 — ABNORMAL HIGH
pO2, Arterial: 117 — ABNORMAL HIGH
pO2, Arterial: 76 — ABNORMAL LOW
pO2, Arterial: 84
pO2, Arterial: 90

## 2011-05-29 LAB — URINALYSIS, ROUTINE W REFLEX MICROSCOPIC
Bilirubin Urine: NEGATIVE
Ketones, ur: NEGATIVE
Nitrite: POSITIVE — AB
Urobilinogen, UA: 1
pH: 5

## 2011-05-29 LAB — CBC
HCT: 25.1 — ABNORMAL LOW
HCT: 25.4 — ABNORMAL LOW
HCT: 28.2 — ABNORMAL LOW
HCT: 31.9 — ABNORMAL LOW
HCT: 33.7 — ABNORMAL LOW
Hemoglobin: 10.7 — ABNORMAL LOW
Hemoglobin: 8.4 — ABNORMAL LOW
Hemoglobin: 9.4 — ABNORMAL LOW
MCHC: 33
MCHC: 33
MCHC: 33.4
MCHC: 33.5
MCHC: 33.5
MCHC: 33.8
MCHC: 33.9
MCHC: 34
MCV: 89.7
MCV: 90.3
MCV: 90.5
MCV: 90.6
MCV: 91.2
MCV: 91.4
MCV: 91.5
MCV: 92.5
Platelets: 205
Platelets: 212
Platelets: 226
Platelets: 229
Platelets: 254
Platelets: 295
Platelets: 305
Platelets: 385
RBC: 2.75 — ABNORMAL LOW
RBC: 3.27 — ABNORMAL LOW
RBC: 3.31 — ABNORMAL LOW
RBC: 3.72 — ABNORMAL LOW
RBC: 4.86
RDW: 13.7
RDW: 14.2
RDW: 15.3
RDW: 15.3
RDW: 15.7 — ABNORMAL HIGH
RDW: 15.7 — ABNORMAL HIGH
RDW: 16.2 — ABNORMAL HIGH
WBC: 14.1 — ABNORMAL HIGH
WBC: 15.4 — ABNORMAL HIGH
WBC: 15.8 — ABNORMAL HIGH
WBC: 9.1

## 2011-05-29 LAB — I-STAT 8, (EC8 V) (CONVERTED LAB)
Acid-Base Excess: 1
BUN: 10
Chloride: 103
Potassium: 3.5
pH, Ven: 7.446 — ABNORMAL HIGH

## 2011-05-29 LAB — CROSSMATCH
ABO/RH(D): B POS
Antibody Screen: NEGATIVE

## 2011-05-29 LAB — CARBOXYHEMOGLOBIN
Carboxyhemoglobin: 1.5
Methemoglobin: 1.1
O2 Saturation: 68.1
O2 Saturation: 72.5
Total hemoglobin: 11.3 — ABNORMAL LOW
Total hemoglobin: 9.9 — ABNORMAL LOW

## 2011-05-29 LAB — LIPID PANEL
Cholesterol: 94
HDL: 36 — ABNORMAL LOW
Total CHOL/HDL Ratio: 2.6

## 2011-05-29 LAB — CULTURE, BLOOD (ROUTINE X 2)
Culture: NO GROWTH
Culture: NO GROWTH

## 2011-05-29 LAB — BLOOD GAS, ARTERIAL
Acid-Base Excess: 1.6
Bicarbonate: 25.3 — ABNORMAL HIGH
Patient temperature: 98.6
TCO2: 26.5
pH, Arterial: 7.44 — ABNORMAL HIGH

## 2011-05-29 LAB — B-NATRIURETIC PEPTIDE (CONVERTED LAB)
Pro B Natriuretic peptide (BNP): 343 — ABNORMAL HIGH
Pro B Natriuretic peptide (BNP): 860 — ABNORMAL HIGH

## 2011-05-29 LAB — CULTURE, BAL-QUANTITATIVE W GRAM STAIN
Colony Count: NO GROWTH
Culture: NO GROWTH

## 2011-05-29 LAB — POCT I-STAT CREATININE: Creatinine, Ser: 1.1

## 2011-05-29 LAB — VANCOMYCIN, RANDOM: Vancomycin Rm: 21.4

## 2011-05-29 LAB — DIFFERENTIAL
Basophils Absolute: 0
Basophils Relative: 0
Eosinophils Absolute: 0.1 — ABNORMAL LOW
Monocytes Relative: 4
Neutrophils Relative %: 88 — ABNORMAL HIGH

## 2011-05-29 LAB — URINE MICROSCOPIC-ADD ON

## 2011-05-29 LAB — MAGNESIUM
Magnesium: 2.6 — ABNORMAL HIGH
Magnesium: 2.9 — ABNORMAL HIGH

## 2011-05-29 LAB — VANCOMYCIN, TROUGH: Vancomycin Tr: 27.4

## 2011-05-29 LAB — URINE CULTURE: Culture: NO GROWTH

## 2011-05-29 LAB — PHOSPHORUS
Phosphorus: 4.2
Phosphorus: 5.3 — ABNORMAL HIGH

## 2011-05-29 LAB — POCT CARDIAC MARKERS
Myoglobin, poc: 85.8
Operator id: 192351

## 2011-05-31 LAB — COMPREHENSIVE METABOLIC PANEL
ALT: 15
AST: 19
AST: 24
Albumin: 2.9 — ABNORMAL LOW
Albumin: 3.2 — ABNORMAL LOW
Alkaline Phosphatase: 120 — ABNORMAL HIGH
Alkaline Phosphatase: 98
BUN: 13
BUN: 18
CO2: 29
Calcium: 8.4
Chloride: 91 — ABNORMAL LOW
Creatinine, Ser: 1.19
GFR calc Af Amer: 53 — ABNORMAL LOW
GFR calc Af Amer: 56 — ABNORMAL LOW
GFR calc non Af Amer: 46 — ABNORMAL LOW
Glucose, Bld: 133 — ABNORMAL HIGH
Potassium: 2.3 — CL
Potassium: 2.5 — CL
Sodium: 123 — ABNORMAL LOW
Sodium: 132 — ABNORMAL LOW
Total Bilirubin: 0.9
Total Protein: 5.5 — ABNORMAL LOW
Total Protein: 6.1

## 2011-05-31 LAB — CBC
HCT: 28.5 — ABNORMAL LOW
HCT: 32 — ABNORMAL LOW
HCT: 35.9 — ABNORMAL LOW
Hemoglobin: 11 — ABNORMAL LOW
Hemoglobin: 9.6 — ABNORMAL LOW
MCHC: 33
MCHC: 33.8
MCHC: 34.3
MCV: 89.4
MCV: 91.4
MCV: 94.1
Platelets: 136 — ABNORMAL LOW
Platelets: 176
Platelets: 203
RBC: 3.12 — ABNORMAL LOW
RBC: 3.58 — ABNORMAL LOW
RDW: 12.4
RDW: 12.4
RDW: 12.7
WBC: 12.3 — ABNORMAL HIGH
WBC: 7

## 2011-05-31 LAB — DIFFERENTIAL
Basophils Absolute: 0
Basophils Absolute: 0
Basophils Absolute: 0
Basophils Relative: 0
Basophils Relative: 0
Basophils Relative: 0
Eosinophils Absolute: 0
Eosinophils Absolute: 0.2
Eosinophils Absolute: 0.2
Eosinophils Relative: 0
Eosinophils Relative: 2
Eosinophils Relative: 3
Lymphocytes Relative: 31
Lymphocytes Relative: 37
Lymphs Abs: 2.2
Lymphs Abs: 4.6 — ABNORMAL HIGH
Monocytes Absolute: 0.3
Monocytes Absolute: 0.6
Monocytes Relative: 5
Monocytes Relative: 5
Neutro Abs: 4.3
Neutro Abs: 6.8
Neutrophils Relative %: 56
Neutrophils Relative %: 61
Neutrophils Relative %: 82 — ABNORMAL HIGH

## 2011-05-31 LAB — MAGNESIUM
Magnesium: 2
Magnesium: 2.1

## 2011-05-31 LAB — BASIC METABOLIC PANEL
BUN: 13
BUN: 16
CO2: 26
Calcium: 8.3 — ABNORMAL LOW
Chloride: 93 — ABNORMAL LOW
Chloride: 97
Creatinine, Ser: 1.27 — ABNORMAL HIGH
Creatinine, Ser: 1.39 — ABNORMAL HIGH
GFR calc Af Amer: 47 — ABNORMAL LOW
GFR calc Af Amer: 52 — ABNORMAL LOW
GFR calc non Af Amer: 39 — ABNORMAL LOW
GFR calc non Af Amer: 43 — ABNORMAL LOW
Glucose, Bld: 214 — ABNORMAL HIGH
Potassium: 3.3 — ABNORMAL LOW
Potassium: 3.5
Sodium: 131 — ABNORMAL LOW

## 2011-05-31 LAB — CK TOTAL AND CKMB (NOT AT ARMC)
CK, MB: 5.7 — ABNORMAL HIGH
Total CK: 61

## 2011-05-31 LAB — URINE CULTURE: Colony Count: NO GROWTH

## 2011-05-31 LAB — URINALYSIS, ROUTINE W REFLEX MICROSCOPIC
Bilirubin Urine: NEGATIVE
Hgb urine dipstick: NEGATIVE
Nitrite: NEGATIVE
Protein, ur: NEGATIVE
Specific Gravity, Urine: 1.035 — ABNORMAL HIGH
Urobilinogen, UA: 0.2

## 2011-05-31 LAB — PHOSPHORUS
Phosphorus: 3
Phosphorus: 3.4

## 2011-05-31 LAB — CULTURE, BLOOD (ROUTINE X 2)

## 2011-05-31 LAB — TROPONIN I
Troponin I: 0.04
Troponin I: 0.04
Troponin I: 0.08 — ABNORMAL HIGH

## 2011-05-31 LAB — HEMOGLOBIN A1C: Mean Plasma Glucose: 350

## 2011-05-31 LAB — B-NATRIURETIC PEPTIDE (CONVERTED LAB)
Pro B Natriuretic peptide (BNP): 173 — ABNORMAL HIGH
Pro B Natriuretic peptide (BNP): 180 — ABNORMAL HIGH

## 2011-05-31 LAB — POCT I-STAT CREATININE
Creatinine, Ser: 1.4 — ABNORMAL HIGH
Operator id: 285491

## 2011-05-31 LAB — POCT CARDIAC MARKERS: CKMB, poc: 3.8

## 2011-05-31 LAB — TSH: TSH: 4.17

## 2011-06-01 LAB — POCT CARDIAC MARKERS
CKMB, poc: <1 — ABNORMAL LOW
Myoglobin, poc: 47.2
Operator id: 4533

## 2011-06-01 LAB — BASIC METABOLIC PANEL
BUN: 10
Calcium: 9
Chloride: 106
Creatinine, Ser: 1.08
GFR calc Af Amer: >60
GFR calc non Af Amer: 52 — ABNORMAL LOW

## 2011-06-01 LAB — DIFFERENTIAL
Basophils Relative: 0
Eosinophils Absolute: 0.1
Lymphs Abs: 1.1
Neutro Abs: 10.7 — ABNORMAL HIGH
Neutrophils Relative %: 86 — ABNORMAL HIGH

## 2011-06-01 LAB — CBC
MCV: 92.2
Platelets: 213
RBC: 3.26 — ABNORMAL LOW
WBC: 12.5 — ABNORMAL HIGH

## 2011-06-01 LAB — B-NATRIURETIC PEPTIDE (CONVERTED LAB): Pro B Natriuretic peptide (BNP): 228 — ABNORMAL HIGH

## 2011-06-07 ENCOUNTER — Encounter: Payer: Self-pay | Admitting: Internal Medicine

## 2011-06-18 ENCOUNTER — Telehealth: Payer: Self-pay | Admitting: Internal Medicine

## 2011-06-18 NOTE — Telephone Encounter (Signed)
Returned call to patient and left message for her to call me back. 

## 2011-06-18 NOTE — Telephone Encounter (Signed)
Pt calling wanting to get defib check with Dr. Johney Frame. Pt was no show for last two appts for defib check with Allred, pt said she forgot about both appts. Pt said she needs to be seen ASAP b/c pt is having some sob. Pt was scheduled for appt w/ 11/15 but wanted msg sent to nurse to see if pt can get sooner appt. Please return pt call to discuss further.

## 2011-06-26 NOTE — Telephone Encounter (Signed)
Called patient and she has been at her sisters due to her having knee replacement  She is going to keep her appointment next week

## 2011-07-05 ENCOUNTER — Ambulatory Visit (INDEPENDENT_AMBULATORY_CARE_PROVIDER_SITE_OTHER): Payer: Medicare Other | Admitting: Internal Medicine

## 2011-07-05 ENCOUNTER — Encounter: Payer: Self-pay | Admitting: Internal Medicine

## 2011-07-05 DIAGNOSIS — I519 Heart disease, unspecified: Secondary | ICD-10-CM

## 2011-07-05 DIAGNOSIS — E785 Hyperlipidemia, unspecified: Secondary | ICD-10-CM

## 2011-07-05 DIAGNOSIS — I10 Essential (primary) hypertension: Secondary | ICD-10-CM

## 2011-07-05 DIAGNOSIS — I509 Heart failure, unspecified: Secondary | ICD-10-CM

## 2011-07-05 DIAGNOSIS — I428 Other cardiomyopathies: Secondary | ICD-10-CM

## 2011-07-05 LAB — ICD DEVICE OBSERVATION
AL IMPEDENCE ICD: 448 Ohm
BAMS-0001: 170 {beats}/min
CHARGE TIME: 10.81 s
LV LEAD IMPEDENCE ICD: 776 Ohm
PACEART VT: 0
TOT-0001: 1
TOT-0002: 0
TOT-0006: 20081223000000
TZAT-0001ATACH: 1
TZAT-0001ATACH: 3
TZAT-0001FASTVT: 1
TZAT-0004SLOWVT: 8
TZAT-0004SLOWVT: 8
TZAT-0012ATACH: 150 ms
TZAT-0012ATACH: 150 ms
TZAT-0012SLOWVT: 200 ms
TZAT-0012SLOWVT: 200 ms
TZAT-0013SLOWVT: 2
TZAT-0013SLOWVT: 2
TZAT-0018ATACH: NEGATIVE
TZAT-0018ATACH: NEGATIVE
TZAT-0020ATACH: 1.5 ms
TZAT-0020ATACH: 1.5 ms
TZAT-0020SLOWVT: 1.5 ms
TZAT-0020SLOWVT: 1.5 ms
TZON-0003ATACH: 350 ms
TZON-0003SLOWVT: 340 ms
TZON-0004SLOWVT: 16
TZON-0005SLOWVT: 12
TZST-0001ATACH: 4
TZST-0001ATACH: 6
TZST-0001FASTVT: 2
TZST-0001FASTVT: 3
TZST-0001FASTVT: 4
TZST-0001FASTVT: 6
TZST-0001SLOWVT: 6
TZST-0002ATACH: NEGATIVE
TZST-0002ATACH: NEGATIVE
TZST-0002FASTVT: NEGATIVE
TZST-0002FASTVT: NEGATIVE
TZST-0003SLOWVT: 35 J
TZST-0003SLOWVT: 35 J
VENTRICULAR PACING ICD: 99.97 pct

## 2011-07-05 MED ORDER — FUROSEMIDE 40 MG PO TABS: ORAL_TABLET | ORAL | Status: DC

## 2011-07-05 NOTE — Progress Notes (Signed)
[name] is a pleasant 65 y.o. patient with a nonischemic CM with NYHA Class III CHF and LBBB sp BiV ICD implantation (MDT) by Dr Amil Amen 08/12/07  who presents today to establish care in the Electrophysiology device clinic.  The patient reports doing very well since having a BiV ICD implanted and remains very active despite her multiple medical issues.  She reports having an excellent response to resynchronization therapy.  She continues to have occasional difficulty with CHF.  She reports that over the past 2 weeks, she has had 10 lb weight gain with BLE edema.  She reports mild SOB.   Today, she  denies symptoms of palpitations, chest pain, dizziness, presyncope, syncope, or neurologic sequela.  The patientis tolerating medications without difficulties and is otherwise without complaint today.   Past Medical History  Diagnosis Date  . Asthma   . Depression   . Sjogren's syndrome   . Lupus   . Chronic systolic dysfunction of left ventricle   . Nonischemic cardiomyopathy   . Chronic renal insufficiency   . Diabetes mellitus   . Hypertension   . Hyperlipidemia   . Pulmonary embolism     previously on coumadin  . LBBB (left bundle branch block)     Past Surgical History  Procedure Date  . Mastectomy 1982  . Angioplasty 2002  . Hysterectomy (other)   . Biventricular defibrillator implantation 08/12/07    MDT Concerto implanted by Dr Amil Amen    History   Social History  . Marital Status: Divorced    Spouse Name: N/A    Number of Children: N/A  . Years of Education: N/A   Occupational History  . Not on file.   Social History Main Topics  . Smoking status: Former Games developer  . Smokeless tobacco: Not on file  . Alcohol Use: No  . Drug Use: No  . Sexually Active: Not on file   Other Topics Concern  . 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  Allergies  Allergen Reactions    . Sulfonamide Derivatives     Current Outpatient Prescriptions  Medication Sig Dispense Refill  . aspirin 325 MG tablet Take 325 mg by mouth daily.        . carvedilol (COREG) 3.125 MG tablet Take 3.125 mg by mouth 2 (two) times daily with a meal.        . DULoxetine HCl (CYMBALTA PO) Take 120 mg by mouth daily.        Marland Kitchen ezetimibe-simvastatin (VYTORIN) 10-40 MG per tablet Take 1 tablet by mouth at bedtime.        . furosemide (LASIX) 40 MG tablet Take 40mg  in the morning and 20mg  in the afternoon  135 tablet  3  . isosorbide dinitrate (ISORDIL) 30 MG tablet Take 30 mg by mouth daily.        Marland Kitchen levothyroxine (SYNTHROID, LEVOTHROID) 75 MCG tablet Take by mouth daily. Taking 100 Every Other Day, 80 Every Other Day      . LORazepam (ATIVAN) 1 MG tablet Take 1 mg by mouth 3 (three) times daily as needed.       . potassium chloride (KLOR-CON) 8 MEQ CR tablet Take 8 mEq by mouth 2 (two) times daily.       Marland Kitchen warfarin (COUMADIN) 7.5 MG tablet Take 1 tablet (7.5 mg total) by mouth daily.  30 tablet  11  ROS- all systems are reviewed and negative except as per HPI  Physical Exam: Filed Vitals:   07/05/11 1214  BP: 150/76  Pulse: 93  Height: 5\' 6"  (1.676 m)  Weight: 224 lb 1.9 oz (101.66 kg)    GEN- The patient is overweight appearing, alert and oriented x 3 today.   Head- normocephalic, atraumatic Eyes-  Sclera clear, conjunctiva pink Ears- hearing intact Oropharynx- clear Neck- supple, no JVP Lymph- no cervical lymphadenopathy Lungs- Clear to ausculation bilaterally, normal work of breathing Chest- ICD pocket is well healed Heart- Regular rate and rhythm, no murmurs, rubs or gallops, PMI not laterally displaced GI- soft, NT, ND, + BS Extremities- no clubbing, cyanosis, trace edema MS- no significant deformity or atrophy Skin- no rash or lesion Psych- euthymic mood, full affect Neuro- strength and sensation are intact  ICD interrogation- reviewed in detail today,  See PACEART  report  Assessment and Plan:

## 2011-07-05 NOTE — Assessment & Plan Note (Signed)
Mildly volume overloaded on exam.  Optivol is also elevated. I will increase lasix to 40mg  qam and 20mg  qpm x 3 days, then return to prior regimen of 20mg  BID 2gram sodium diet She will contact Dr Mayford Knife if symptoms do not improve.  Normal BiV ICD function 11-14 months battery longevity by MDT estimation See Arita Miss Art report No changes today

## 2011-07-05 NOTE — Assessment & Plan Note (Signed)
Increase lasix x 3 days above Follow-up with Dr Mayford Knife

## 2011-07-05 NOTE — Patient Instructions (Addendum)
Your physician wants you to follow-up in: 12 months with Dr Jacquiline Doe will receive a reminder letter in the mail two months in advance. If you don't receive a letter, please call our office to schedule the follow-up appointment.  Remote monitoring is used to monitor your Pacemaker of ICD from home. This monitoring reduces the number of office visits required to check your device to one time per year. It allows [place] to keep an eye on the functioning of your device to ensure it is working properly. You are scheduled for a device check from home on 10/04/2011. You may send your transmission at any time that day. If you have a wireless device, the transmission will be sent automatically. After your physician reviews your transmission, you will receive a postcard with your next transmission date.  Your physician has recommended you make the following change in your medication:  1) Increase Lasix to 40mg  in the morning and 20mg  in the afternoon  2 Gram Low Sodium Diet A 2 gram sodium diet restricts the amount of sodium in the diet to no more than 2 g or 2000 mg daily. Limiting the amount of sodium is often used to help lower blood pressure. It is important if you have heart, liver, or kidney problems. Many foods contain sodium for flavor and sometimes as a preservative. When the amount of sodium in a diet needs to be low, it is important to know what to look for when choosing foods and drinks. The following includes some information and guidelines to help make it easier for you to adapt to a low sodium diet. QUICK TIPS  Do not add salt to food.   Avoid convenience items and fast food.   Choose unsalted snack foods.   Buy lower sodium products, often labeled as "lower sodium" or "no salt added."   Check food labels to learn how much sodium is in 1 serving.   When eating at a restaurant, ask that your food be prepared with less salt or none, if possible.  READING FOOD LABELS FOR SODIUM INFORMATION The  nutrition facts label is a good place to find how much sodium is in foods. Look for products with no more than 500 to 600 mg of sodium per meal and no more than 150 mg per serving. Remember that 2 g = 2000 mg. The food label may also list foods as:  Sodium-free: Less than 5 mg in a serving.   Very low sodium: 35 mg or less in a serving.   Low-sodium: 140 mg or less in a serving.   Light in sodium: 50% less sodium in a serving. For example, if a food that usually has 300 mg of sodium is changed to become light in sodium, it will have 150 mg of sodium.   Reduced sodium: 25% less sodium in a serving. For example, if a food that usually has 400 mg of sodium is changed to reduced sodium, it will have 300 mg of sodium.  CHOOSING FOODS Grains  Avoid: Salted crackers and snack items. Some cereals, including instant hot cereals. Bread stuffing and biscuit mixes. Seasoned rice or pasta mixes.   Choose: Unsalted snack items. Low-sodium cereals, oats, puffed wheat and rice, shredded wheat. English muffins and bread. Pasta.  Meats  Avoid: Salted, canned, smoked, spiced, pickled meats, including fish and poultry. Bacon, ham, sausage, cold cuts, hot dogs, anchovies.   Choose: Low-sodium canned tuna and salmon. Fresh or frozen meat, poultry, and fish.  Dairy  Avoid: Processed cheese and spreads. Cottage cheese. Buttermilk and condensed milk. Regular cheese.   Choose: Milk. Low-sodium cottage cheese. Yogurt. Sour cream. Low-sodium cheese.  Fruits and Vegetables  Avoid: Regular canned vegetables. Regular canned tomato sauce and paste. Frozen vegetables in sauces. Olives. Rosita Fire. Relishes. Sauerkraut.   Choose: Low-sodium canned vegetables. Low-sodium tomato sauce and paste. Frozen or fresh vegetables. Fresh and frozen fruit.  Condiments  Avoid: Canned and packaged gravies. Worcestershire sauce. Tartar sauce. Barbecue sauce. Soy sauce. Steak sauce. Ketchup. Onion, garlic, and table salt. Meat  flavorings and tenderizers.   Choose: Fresh and dried herbs and spices. Low-sodium varieties of mustard and ketchup. Lemon juice. Tabasco sauce. Horseradish.  SAMPLE 2 GRAM SODIUM MEAL PLAN Breakfast / Sodium (mg)  1 cup low-fat milk / 143 mg   2 slices whole-wheat toast / 270 mg   1 tbs heart-healthy margarine / 153 mg   1 hard-boiled egg / 139 mg   1 small orange / 0 mg  Lunch / Sodium (mg)  1 cup raw carrots / 76 mg    cup hummus / 298 mg   1 cup low-fat milk / 143 mg    cup red grapes / 2 mg   1 whole-wheat pita bread / 356 mg  Dinner / Sodium (mg)  1 cup whole-wheat pasta / 2 mg   1 cup low-sodium tomato sauce / 73 mg   3 oz lean ground beef / 57 mg   1 small side salad (1 cup raw spinach leaves,  cup cucumber,  cup yellow bell pepper) with 1 tsp olive oil and 1 tsp red wine vinegar / 25 mg  Snack / Sodium (mg)  1 container low-fat vanilla yogurt / 107 mg   3 graham cracker squares / 127 mg  Nutrient Analysis  Calories: 2033   Protein: 77 g   Carbohydrate: 282 g   Fat: 72 g   Sodium: 1971 mg  Document Released: 08/06/2005 Document Revised: 04/18/2011 Document Reviewed: 11/07/2009 [facility] Patient Information 2012 [place], [place].

## 2011-07-05 NOTE — Assessment & Plan Note (Signed)
No changes today Per Dr Mayford Knife

## 2011-10-04 ENCOUNTER — Encounter: Payer: Medicare Other | Admitting: *Deleted

## 2011-10-08 ENCOUNTER — Encounter: Payer: Self-pay | Admitting: *Deleted

## 2011-10-24 ENCOUNTER — Ambulatory Visit (INDEPENDENT_AMBULATORY_CARE_PROVIDER_SITE_OTHER): Payer: Medicare Other | Admitting: *Deleted

## 2011-10-24 ENCOUNTER — Encounter: Payer: Self-pay | Admitting: Internal Medicine

## 2011-10-24 DIAGNOSIS — I519 Heart disease, unspecified: Secondary | ICD-10-CM

## 2011-10-24 DIAGNOSIS — I428 Other cardiomyopathies: Secondary | ICD-10-CM

## 2011-10-26 LAB — REMOTE ICD DEVICE
AL IMPEDENCE ICD: 416 Ohm
BATTERY VOLTAGE: 2.79 V
BRDY-0002LV: 50 {beats}/min
BRDY-0003LV: 130 {beats}/min
BRDY-0004LV: 120 {beats}/min
FVT: 0
LV LEAD THRESHOLD: 1 V
PACEART VT: 0
TOT-0002: 0
TZAT-0001ATACH: 3
TZAT-0001FASTVT: 1
TZAT-0002ATACH: NEGATIVE
TZAT-0002FASTVT: NEGATIVE
TZAT-0011SLOWVT: 10 ms
TZAT-0011SLOWVT: 10 ms
TZAT-0012ATACH: 150 ms
TZAT-0012FASTVT: 200 ms
TZAT-0012SLOWVT: 200 ms
TZAT-0012SLOWVT: 200 ms
TZAT-0018ATACH: NEGATIVE
TZAT-0018SLOWVT: NEGATIVE
TZAT-0018SLOWVT: NEGATIVE
TZAT-0019ATACH: 6 V
TZAT-0019ATACH: 6 V
TZAT-0019SLOWVT: 8 V
TZAT-0019SLOWVT: 8 V
TZAT-0020ATACH: 1.5 ms
TZAT-0020ATACH: 1.5 ms
TZAT-0020ATACH: 1.5 ms
TZAT-0020SLOWVT: 1.5 ms
TZAT-0020SLOWVT: 1.5 ms
TZON-0003VSLOWVT: 450 ms
TZON-0005SLOWVT: 12
TZST-0001ATACH: 5
TZST-0001ATACH: 6
TZST-0001FASTVT: 3
TZST-0001FASTVT: 4
TZST-0001FASTVT: 5
TZST-0001SLOWVT: 4
TZST-0001SLOWVT: 5
TZST-0002ATACH: NEGATIVE
TZST-0002FASTVT: NEGATIVE
TZST-0002FASTVT: NEGATIVE
TZST-0003SLOWVT: 20 J
TZST-0003SLOWVT: 35 J
VENTRICULAR PACING ICD: 99.87 pct

## 2011-10-29 ENCOUNTER — Encounter: Payer: Self-pay | Admitting: *Deleted

## 2011-10-29 NOTE — Progress Notes (Signed)
Remote defib check with icm  

## 2012-01-24 ENCOUNTER — Encounter: Payer: Medicare Other | Admitting: *Deleted

## 2012-02-04 ENCOUNTER — Encounter: Payer: Self-pay | Admitting: *Deleted

## 2012-04-16 ENCOUNTER — Encounter: Payer: Self-pay | Admitting: *Deleted

## 2012-07-12 ENCOUNTER — Encounter (HOSPITAL_COMMUNITY): Payer: Self-pay | Admitting: *Deleted

## 2012-07-12 ENCOUNTER — Emergency Department (HOSPITAL_COMMUNITY): Payer: Medicare Other

## 2012-07-12 ENCOUNTER — Emergency Department (HOSPITAL_COMMUNITY)
Admission: EM | Admit: 2012-07-12 | Discharge: 2012-07-12 | Disposition: A | Discharge status: Home / Self Care | Payer: Medicare Other | Attending: Emergency Medicine | Admitting: Emergency Medicine

## 2012-07-12 DIAGNOSIS — Z7982 Long term (current) use of aspirin: Secondary | ICD-10-CM | POA: Insufficient documentation

## 2012-07-12 DIAGNOSIS — I2699 Other pulmonary embolism without acute cor pulmonale: Secondary | ICD-10-CM | POA: Insufficient documentation

## 2012-07-12 DIAGNOSIS — E119 Type 2 diabetes mellitus without complications: Secondary | ICD-10-CM | POA: Insufficient documentation

## 2012-07-12 DIAGNOSIS — Z7901 Long term (current) use of anticoagulants: Secondary | ICD-10-CM | POA: Insufficient documentation

## 2012-07-12 DIAGNOSIS — I428 Other cardiomyopathies: Secondary | ICD-10-CM | POA: Insufficient documentation

## 2012-07-12 DIAGNOSIS — S52599A Other fractures of lower end of unspecified radius, initial encounter for closed fracture: Secondary | ICD-10-CM | POA: Insufficient documentation

## 2012-07-12 DIAGNOSIS — J45909 Unspecified asthma, uncomplicated: Secondary | ICD-10-CM | POA: Insufficient documentation

## 2012-07-12 DIAGNOSIS — I447 Left bundle-branch block, unspecified: Secondary | ICD-10-CM | POA: Insufficient documentation

## 2012-07-12 DIAGNOSIS — Y929 Unspecified place or not applicable: Secondary | ICD-10-CM | POA: Insufficient documentation

## 2012-07-12 DIAGNOSIS — W010XXA Fall on same level from slipping, tripping and stumbling without subsequent striking against object, initial encounter: Secondary | ICD-10-CM | POA: Insufficient documentation

## 2012-07-12 DIAGNOSIS — S52509A Unspecified fracture of the lower end of unspecified radius, initial encounter for closed fracture: Secondary | ICD-10-CM

## 2012-07-12 DIAGNOSIS — E785 Hyperlipidemia, unspecified: Secondary | ICD-10-CM | POA: Insufficient documentation

## 2012-07-12 DIAGNOSIS — Z87891 Personal history of nicotine dependence: Secondary | ICD-10-CM | POA: Insufficient documentation

## 2012-07-12 DIAGNOSIS — F3289 Other specified depressive episodes: Secondary | ICD-10-CM | POA: Insufficient documentation

## 2012-07-12 DIAGNOSIS — I129 Hypertensive chronic kidney disease with stage 1 through stage 4 chronic kidney disease, or unspecified chronic kidney disease: Secondary | ICD-10-CM | POA: Insufficient documentation

## 2012-07-12 DIAGNOSIS — Y9301 Activity, walking, marching and hiking: Secondary | ICD-10-CM | POA: Insufficient documentation

## 2012-07-12 DIAGNOSIS — Z79899 Other long term (current) drug therapy: Secondary | ICD-10-CM | POA: Insufficient documentation

## 2012-07-12 DIAGNOSIS — F329 Major depressive disorder, single episode, unspecified: Secondary | ICD-10-CM | POA: Insufficient documentation

## 2012-07-12 DIAGNOSIS — N189 Chronic kidney disease, unspecified: Secondary | ICD-10-CM | POA: Insufficient documentation

## 2012-07-12 LAB — CBC WITH DIFFERENTIAL/PLATELET
Basophils Absolute: 0.1 10*3/uL (ref 0.0–0.1)
Basophils Relative: 1 % (ref 0–1)
Eosinophils Absolute: 0.1 10*3/uL (ref 0.0–0.7)
Eosinophils Relative: 1 % (ref 0–5)
HCT: 37.6 % (ref 36.0–46.0)
Lymphocytes Relative: 14 % (ref 12–46)
MCH: 30 pg (ref 26.0–34.0)
MCHC: 33 g/dL (ref 30.0–36.0)
MCV: 91 fL (ref 78.0–100.0)
Monocytes Absolute: 0.6 10*3/uL (ref 0.1–1.0)
Platelets: 239 10*3/uL (ref 150–400)
RDW: 13 % (ref 11.5–15.5)
WBC: 9.9 10*3/uL (ref 4.0–10.5)

## 2012-07-12 LAB — BASIC METABOLIC PANEL
CO2: 23 mEq/L (ref 19–32)
Calcium: 9.6 mg/dL (ref 8.4–10.5)
Creatinine, Ser: 1.66 mg/dL — ABNORMAL HIGH (ref 0.50–1.10)
GFR calc Af Amer: 36 mL/min — ABNORMAL LOW (ref >90)
GFR calc non Af Amer: 31 mL/min — ABNORMAL LOW (ref >90)
Sodium: 134 mEq/L — ABNORMAL LOW (ref 135–145)

## 2012-07-12 MED ORDER — HYDROCODONE-ACETAMINOPHEN 5-325 MG PO TABS
2.0000 | ORAL_TABLET | Freq: Once | ORAL | Status: AC
  Administered 2012-07-12: 2 via ORAL
  Filled 2012-07-12: qty 2

## 2012-07-12 MED ORDER — HYDROCODONE-ACETAMINOPHEN 5-500 MG PO TABS: 1.0000 | ORAL_TABLET | Freq: Four times a day (QID) | ORAL | Status: DC | PRN

## 2012-07-12 NOTE — Progress Notes (Signed)
Orthopedic Tech Progress Note Patient Details:  [name] Jan 02, 1946 [id number]  Ortho Devices Type of Ortho Device: Arm foam sling;Sugartong splint Ortho Device/Splint Location: left arm Ortho Device/Splint Interventions: Application   Jillayne Witte 07/12/2012, 8:40 PM

## 2012-07-12 NOTE — ED Provider Notes (Signed)
History   This chart was scribed for Geoffery Lyons, MD, by Frederik Pear, ER scribe. The patient was seen in room TR06C/TR06C and the patient's care was started at 1810.    CSN: [id number]  Arrival date & time 07/12/12  1758   First MD Initiated Contact with Patient 07/12/12 1810      Chief Complaint  Patient presents with  . Arm Injury    (Consider location/radiation/quality/duration/timing/severity/associated sxs/prior treatment) HPI Comments: [name] is a 66 y.o. female who presents to the Emergency Department complaining of left-sided wrist pain that began after she tripped and fell while walking and landed on her wrist at 16:30. She reports that she felt generalized weakness prior to her fall. She has a h/o of breaking her left wrist.   Patient is a 66 y.o. female presenting with arm injury.  Arm Injury     Past Medical History  Diagnosis Date  . Asthma   . Depression   . Sjogren's syndrome   . Lupus   . Chronic systolic dysfunction of left ventricle   . Nonischemic cardiomyopathy   . Chronic renal insufficiency   . Diabetes mellitus   . Hypertension   . Hyperlipidemia   . Pulmonary embolism     previously on coumadin  . LBBB (left bundle branch block)     Past Surgical History  Procedure Date  . Mastectomy 1982  . Angioplasty 2002  . Hysterectomy (other)   . Biventricular defibrillator implantation 08/12/07    MDT Concerto implanted by Dr Amil Amen    History reviewed. No pertinent family history.  History  Substance Use Topics  . Smoking status: Former Games developer  . Smokeless tobacco: Not on file  . Alcohol Use: No    OB History    Grav Para Term Preterm Abortions TAB SAB Ect Mult Living                  Review of Systems A complete 10 system review of systems was obtained and all systems are negative except as noted in the HPI and PMH.   Allergies  Latex and Sulfonamide derivatives  Home Medications   Current Outpatient Rx  Name  Route   Sig  Dispense  Refill  . ASPIRIN 325 MG PO TABS   Oral   Take 325 mg by mouth daily.           . BUPROPION HCL ER (XL) 150 MG PO TB24   Oral   Take 450 mg by mouth daily.         Marland Kitchen CARVEDILOL 3.125 MG PO TABS   Oral   Take 3.125 mg by mouth 2 (two) times daily with a meal.           . VITAMIN D 2000 UNITS PO TABS   Oral   Take 2,000 Units by mouth every evening.         Marland Kitchen DILTIAZEM HCL ER BEADS 240 MG PO CP24   Oral   Take 240 mg by mouth every evening.         . DULOXETINE HCL 30 MG PO CPEP   Oral   Take 30 mg by mouth every evening. Take with 60 mg capsule for a 90 mg dose         . DULOXETINE HCL 60 MG PO CPEP   Oral   Take 60 mg by mouth every evening. Take with 30 mg capsule for a 90 mg dose         .  EZETIMIBE 10 MG PO TABS   Oral   Take 10 mg by mouth at bedtime.         Marland Kitchen EZETIMIBE-SIMVASTATIN 10-40 MG PO TABS   Oral   Take 1 tablet by mouth at bedtime.           . FUROSEMIDE 40 MG PO TABS   Oral   Take 40 mg by mouth 2 (two) times daily.         Marland Kitchen HYDROCODONE-ACETAMINOPHEN 5-500 MG PO TABS   Oral   Take 1 tablet by mouth 3 (three) times daily as needed. For pain         . ISOSORBIDE DINITRATE 30 MG PO TABS   Oral   Take 30 mg by mouth daily.           Marland Kitchen LEVOTHYROXINE SODIUM 100 MCG PO TABS   Oral   Take 100 mcg by mouth See admin instructions. Take on Monday, Wednesday, Friday and Sunday (88 mcg on other days)         . LEVOTHYROXINE SODIUM 88 MCG PO TABS   Oral   Take 88 mcg by mouth See admin instructions. Take on Tuesday, Thursday and Saturday (100 mcg on other days)         . LORAZEPAM 1 MG PO TABS   Oral   Take 1 mg by mouth 3 (three) times daily as needed. For anxiety or sleep         . ADULT MULTIVITAMIN W/MINERALS CH   Oral   Take 1 tablet by mouth every evening. Vitamin for eyes         . WARFARIN SODIUM 7.5 MG PO TABS   Oral   Take 3.75-7.5 mg by mouth every evening. 1/2 tablet (3.75 mg) on Monday,  1 tablete (7.5 mg) on all other days   30 tablet   11     BP 150/69  Pulse 89  Temp 97.3 F (36.3 C) (Oral)  Resp 20  SpO2 94%  Physical Exam  Nursing note and vitals reviewed. Constitutional: She is oriented to person, place, and time. She appears well-developed and well-nourished. No distress.  HENT:  Head: Normocephalic and atraumatic.  Eyes: EOM are normal. Pupils are equal, round, and reactive to light.  Neck: Normal range of motion. Neck supple. No tracheal deviation present.  Cardiovascular: Normal rate.   Pulmonary/Chest: Effort normal. No respiratory distress.  Abdominal: Soft. She exhibits no distension.  Musculoskeletal: Normal range of motion. She exhibits no edema.  Neurological: She is alert and oriented to person, place, and time.  Skin: Skin is warm and dry.  Psychiatric: She has a normal mood and affect. Her behavior is normal.    ED Course  Procedures (including critical care time)  DIAGNOSTIC STUDIES: Oxygen Saturation is 94% on room air, adequate by my interpretation.    COORDINATION OF CARE:  18:23- Discussed planned course of treatment with the patient, including a left wrist X-ray, who is agreeable at this time.  Labs Reviewed - No data to display Dg Wrist Complete Left  07/12/2012  *RADIOLOGY REPORT*  Clinical Data: 66 year old female status post injury.  LEFT WRIST - COMPLETE 3+ VIEW  Comparison: Wrist series 807.  Findings: Comminuted and impacted distal right radius fracture. DRU and radiocarpal joint involvement.  Minimally-displaced distal ulna fracture not involving the styloid.  Carpal bone alignment remains normal.  Scaphoid appears intact. Carpal joint spaces are within normal limits.  Metacarpals appear intact.  IMPRESSION: 1.  Comminuted and impacted distal left radius fracture with DRU and radiocarpal joint involvement. 2.  Minimally-displaced distal left ulna fracture not involving the styloid.   Original Report Authenticated By: Erskine Speed, M.D.      No diagnosis found.    MDM  The patient presents after a fall and fracturing her wrist.  The fracture is a comminuted, intra-articular distal radius fracture.  I have spoken with Dr. Amanda Pea who will see the patient in the ED.      I personally performed the services described in this documentation, which was scribed in my presence. The recorded information has been reviewed and is accurate.           Geoffery Lyons, MD 07/12/12 [phone]

## 2012-07-12 NOTE — ED Notes (Signed)
Pt transported to radiology.

## 2012-07-12 NOTE — ED Notes (Signed)
Pt returned from XRAY 

## 2012-07-12 NOTE — ED Notes (Signed)
Left wrist deformity noted. Skin warm and dry, pulses palpable, cap refill <3.

## 2012-07-12 NOTE — ED Notes (Signed)
Pt reports tripping and falling, has obv left wrist fx, +radial pulse.

## 2012-07-19 ENCOUNTER — Emergency Department (HOSPITAL_COMMUNITY)
Admission: EM | Admit: 2012-07-19 | Discharge: 2012-07-19 | Disposition: A | Discharge status: Home / Self Care | Payer: Medicare Other | Attending: Emergency Medicine | Admitting: Emergency Medicine

## 2012-07-19 ENCOUNTER — Encounter (HOSPITAL_COMMUNITY): Payer: Self-pay | Admitting: Physical Medicine and Rehabilitation

## 2012-07-19 DIAGNOSIS — Z7982 Long term (current) use of aspirin: Secondary | ICD-10-CM | POA: Insufficient documentation

## 2012-07-19 DIAGNOSIS — I129 Hypertensive chronic kidney disease with stage 1 through stage 4 chronic kidney disease, or unspecified chronic kidney disease: Secondary | ICD-10-CM | POA: Insufficient documentation

## 2012-07-19 DIAGNOSIS — F329 Major depressive disorder, single episode, unspecified: Secondary | ICD-10-CM | POA: Insufficient documentation

## 2012-07-19 DIAGNOSIS — N189 Chronic kidney disease, unspecified: Secondary | ICD-10-CM | POA: Insufficient documentation

## 2012-07-19 DIAGNOSIS — F3289 Other specified depressive episodes: Secondary | ICD-10-CM | POA: Insufficient documentation

## 2012-07-19 DIAGNOSIS — Z8709 Personal history of other diseases of the respiratory system: Secondary | ICD-10-CM | POA: Insufficient documentation

## 2012-07-19 DIAGNOSIS — Z8679 Personal history of other diseases of the circulatory system: Secondary | ICD-10-CM | POA: Insufficient documentation

## 2012-07-19 DIAGNOSIS — Z79899 Other long term (current) drug therapy: Secondary | ICD-10-CM | POA: Insufficient documentation

## 2012-07-19 DIAGNOSIS — Z872 Personal history of diseases of the skin and subcutaneous tissue: Secondary | ICD-10-CM | POA: Insufficient documentation

## 2012-07-19 DIAGNOSIS — Z87891 Personal history of nicotine dependence: Secondary | ICD-10-CM | POA: Insufficient documentation

## 2012-07-19 DIAGNOSIS — Z4689 Encounter for fitting and adjustment of other specified devices: Secondary | ICD-10-CM

## 2012-07-19 DIAGNOSIS — J45909 Unspecified asthma, uncomplicated: Secondary | ICD-10-CM | POA: Insufficient documentation

## 2012-07-19 DIAGNOSIS — E785 Hyperlipidemia, unspecified: Secondary | ICD-10-CM | POA: Insufficient documentation

## 2012-07-19 MED ORDER — OXYCODONE-ACETAMINOPHEN 5-325 MG PO TABS
1.0000 | ORAL_TABLET | Freq: Once | ORAL | Status: AC
  Administered 2012-07-19: 1 via ORAL
  Filled 2012-07-19: qty 1

## 2012-07-19 NOTE — ED Notes (Signed)
Feeling and cap refill in extremity is normal.

## 2012-07-19 NOTE — ED Notes (Signed)
The patient is AOx4 and comfortable with her discharge instructions. 

## 2012-07-19 NOTE — ED Provider Notes (Signed)
History     CSN: [id number]  Arrival date & time 07/19/12  1734   First MD Initiated Contact with Patient 07/19/12 2008      Chief Complaint  Patient presents with  . Arm Pain  . Follow-up    (Consider location/radiation/quality/duration/timing/severity/associated sxs/prior treatment) Patient is a 66 y.o. female presenting with arm pain. The history is provided by the patient.  Arm Pain   patient here complaining of her left forearm cast slipping. Denies any numbness or tinnitus her left hand. No numbness or paresthesias. Full use of her fingers. No tightness or cast. Attempted to adjust the past without success  Past Medical History  Diagnosis Date  . Asthma   . Depression   . Sjogren's syndrome   . Lupus   . Chronic systolic dysfunction of left ventricle   . Nonischemic cardiomyopathy   . Chronic renal insufficiency   . Diabetes mellitus   . Hypertension   . Hyperlipidemia   . Pulmonary embolism     previously on coumadin  . LBBB (left bundle branch block)     Past Surgical History  Procedure Date  . Mastectomy 1982  . Angioplasty 2002  . Hysterectomy (other)   . Biventricular defibrillator implantation 08/12/07    MDT Concerto implanted by Dr Amil Amen    No family history on file.  History  Substance Use Topics  . Smoking status: Former Games developer  . Smokeless tobacco: Not on file  . Alcohol Use: No    OB History    Grav Para Term Preterm Abortions TAB SAB Ect Mult Living                  Review of Systems  All other systems reviewed and are negative.    Allergies  Contrast media; Penicillins; Latex; and Sulfonamide derivatives  Home Medications   Current Outpatient Rx  Name  Route  Sig  Dispense  Refill  . ASPIRIN 325 MG PO TABS   Oral   Take 325 mg by mouth daily.           . BUPROPION HCL ER (XL) 150 MG PO TB24   Oral   Take 450 mg by mouth daily.         Marland Kitchen CARVEDILOL 3.125 MG PO TABS   Oral   Take 3.125 mg by mouth 2 (two)  times daily with a meal.           . VITAMIN D 2000 UNITS PO TABS   Oral   Take 2,000 Units by mouth every evening.         Marland Kitchen DILTIAZEM HCL ER BEADS 240 MG PO CP24   Oral   Take 240 mg by mouth every evening.         . DULOXETINE HCL 30 MG PO CPEP   Oral   Take 30 mg by mouth every evening. Take with 60 mg capsule for a 90 mg dose         . DULOXETINE HCL 60 MG PO CPEP   Oral   Take 60 mg by mouth every evening. Take with 30 mg capsule for a 90 mg dose         . EZETIMIBE 10 MG PO TABS   Oral   Take 10 mg by mouth at bedtime.         Marland Kitchen EZETIMIBE-SIMVASTATIN 10-40 MG PO TABS   Oral   Take 1 tablet by mouth at bedtime.           Marland Kitchen  FUROSEMIDE 40 MG PO TABS   Oral   Take 40 mg by mouth 2 (two) times daily.         Marland Kitchen HYDROCODONE-ACETAMINOPHEN 5-500 MG PO TABS   Oral   Take 1 tablet by mouth 3 (three) times daily as needed. For pain         . ISOSORBIDE DINITRATE 30 MG PO TABS   Oral   Take 30 mg by mouth daily.           Marland Kitchen LEVOTHYROXINE SODIUM 100 MCG PO TABS   Oral   Take 100 mcg by mouth See admin instructions. Take on Monday, Wednesday, Friday and Sunday (88 mcg on other days)         . LEVOTHYROXINE SODIUM 88 MCG PO TABS   Oral   Take 88 mcg by mouth See admin instructions. Take on Tuesday, Thursday and Saturday (100 mcg on other days)         . LORAZEPAM 1 MG PO TABS   Oral   Take 1 mg by mouth 3 (three) times daily as needed. For anxiety or sleep         . ADULT MULTIVITAMIN W/MINERALS CH   Oral   Take 1 tablet by mouth every evening. Vitamin for eyes         . OXYCODONE HCL 5 MG PO TABS   Oral   Take 5 mg by mouth every 4 (four) hours as needed. For pain         . WARFARIN SODIUM 7.5 MG PO TABS   Oral   Take 3.75-7.5 mg by mouth every evening. 1/2 tablet (3.75 mg) on Monday, 1 tablete (7.5 mg) on all other days   30 tablet   11     BP 164/71  Pulse 96  Temp 97.6 F (36.4 C) (Oral)  Resp 16  SpO2 95%  Physical  Exam  Nursing note and vitals reviewed. Constitutional: She is oriented to person, place, and time. She appears well-developed and well-nourished.  Non-toxic appearance.  HENT:  Head: Normocephalic and atraumatic.  Eyes: Conjunctivae normal are normal. Pupils are equal, round, and reactive to light.  Neck: Normal range of motion.  Cardiovascular: Normal rate.   Pulmonary/Chest: Effort normal.  Musculoskeletal:       Pink cast noted to left forearm. Normal skin color at the fingers of the left hand. Neurovascular intact  Neurological: She is alert and oriented to person, place, and time.  Skin: Skin is warm and dry.  Psychiatric: She has a normal mood and affect.    ED Course  Procedures (including critical care time)  Labs Reviewed - No data to display No results found.   No diagnosis found.    MDM  Patient without concern for compartment syndrome at this time. We'll have the orthopedic tech replace her cast and she will see her surgeon next week       Toy Baker, MD 07/19/12 2026

## 2012-07-19 NOTE — Progress Notes (Signed)
Orthopedic Tech Progress Note Patient Details:  [name] [birth year] [id number]  Casting Type of Cast: Short arm cast Cast Location: (L) UE Cast Material: Fiberglass Cast Intervention: Application;Removal     Jennye Moccasin 07/19/2012, 9:21 PM

## 2012-07-19 NOTE — ED Notes (Signed)
Paged ortho tech for cast replacement

## 2012-07-19 NOTE — ED Notes (Signed)
Pt presents to department for evaluation of L arm pain. States she fracture ulna and radius, had cast placed at [facility] last Monday. Now states cast has "slipped down." states pain to entire L arm and hand. Able to wiggle digits. No numbness/tingling. 4/10 pain at the time.

## 2012-08-19 ENCOUNTER — Encounter: Payer: Self-pay | Admitting: *Deleted

## 2012-08-22 ENCOUNTER — Encounter: Payer: Self-pay | Admitting: *Deleted

## 2012-12-15 ENCOUNTER — Encounter: Payer: Medicare Other | Admitting: Internal Medicine

## 2012-12-17 ENCOUNTER — Encounter: Payer: Medicare Other | Admitting: Internal Medicine

## 2013-01-21 ENCOUNTER — Encounter: Payer: Medicare Other | Admitting: Internal Medicine

## 2013-02-09 ENCOUNTER — Encounter: Payer: Medicare Other | Admitting: Internal Medicine

## 2013-02-23 ENCOUNTER — Encounter: Payer: Self-pay | Admitting: Internal Medicine

## 2013-02-23 ENCOUNTER — Ambulatory Visit (INDEPENDENT_AMBULATORY_CARE_PROVIDER_SITE_OTHER): Payer: Medicare Other | Admitting: Internal Medicine

## 2013-02-23 ENCOUNTER — Other Ambulatory Visit: Payer: Self-pay | Admitting: Internal Medicine

## 2013-02-23 ENCOUNTER — Encounter: Payer: Self-pay | Admitting: *Deleted

## 2013-02-23 VITALS — BP 148/76 | HR 94 | Ht 66.0 in | Wt 220.8 lb

## 2013-02-23 DIAGNOSIS — I519 Heart disease, unspecified: Secondary | ICD-10-CM

## 2013-02-23 DIAGNOSIS — I10 Essential (primary) hypertension: Secondary | ICD-10-CM

## 2013-02-23 DIAGNOSIS — Z01810 Encounter for preprocedural cardiovascular examination: Secondary | ICD-10-CM

## 2013-02-23 NOTE — Progress Notes (Signed)
PCP: Bradd Burner, PA-C Primary Cardiologist: [name], MD  [name] is a 67 y.o. female who presents today for routine electrophysiology followup.  Since last being seen in our clinic, the patient reports doing very well.    She has not had her device checked since November of 2012.  Her device is at EOS today (triggered ERI 05/2012).  Today, she denies symptoms of palpitations, chest pain, shortness of breath,  lower extremity edema, dizziness, presyncope, syncope, or ICD shocks.  The patient is otherwise without complaint today.   Past Medical History  Diagnosis Date  . Asthma   . Depression   . Sjogren's syndrome   . Lupus   . Chronic systolic dysfunction of left ventricle   . Nonischemic cardiomyopathy   . Chronic renal insufficiency   . Diabetes mellitus   . Hypertension   . Hyperlipidemia   . Pulmonary embolism     previously on coumadin  . LBBB (left bundle branch block)    Past Surgical History  Procedure Laterality Date  . Mastectomy  1982  . Angioplasty  2002  . Hysterectomy (other)    . Biventricular defibrillator implantation  08/12/07    MDT Concerto implanted by Dr Amil Amen    Current Outpatient Prescriptions  Medication Sig Dispense Refill  . aspirin 325 MG tablet Take 325 mg by mouth daily.        Marland Kitchen atorvastatin (LIPITOR) 80 MG tablet Take 40 mg by mouth daily.       Marland Kitchen buPROPion (WELLBUTRIN XL) 150 MG 24 hr tablet Take 450 mg by mouth daily.      . carisoprodol (SOMA) 350 MG tablet Take 350 mg by mouth as needed.      . carvedilol (COREG) 3.125 MG tablet Take 3.125 mg by mouth 2 (two) times daily with a meal.        . Cholecalciferol (VITAMIN D) 2000 UNITS tablet Take 2,000 Units by mouth every evening.      . diltiazem (TIAZAC) 240 MG 24 hr capsule Take 240 mg by mouth every evening.      . DULoxetine (CYMBALTA) 60 MG capsule Take 60 mg by mouth every evening. Take 2 tabs in the evening      . ezetimibe (ZETIA) 10 MG tablet Take 10 mg  by mouth at bedtime.      Marland Kitchen ezetimibe-simvastatin (VYTORIN) 10-40 MG per tablet Take 1 tablet by mouth at bedtime.        . furosemide (LASIX) 40 MG tablet Take 40 mg by mouth 2 (two) times daily. Take one tab in morning and one tab at night      . HYDROcodone-acetaminophen (VICODIN) 5-500 MG per tablet Take 1 tablet by mouth 3 (three) times daily as needed. For pain      . isosorbide dinitrate (ISORDIL) 30 MG tablet Take 30 mg by mouth daily.        Marland Kitchen levothyroxine (SYNTHROID, LEVOTHROID) 100 MCG tablet Take 100 mcg by mouth See admin instructions. Take on Monday, Wednesday, Friday and Sunday (88 mcg on other days)      . levothyroxine (SYNTHROID, LEVOTHROID) 88 MCG tablet Take 88 mcg by mouth See admin instructions. Take on Tuesday, Thursday and Saturday (100 mcg on other days)      . LORazepam (ATIVAN) 1 MG tablet Take 1 mg by mouth 3 (three) times daily as needed. For anxiety or sleep      . oxyCODONE (OXY IR/ROXICODONE) 5 MG immediate release tablet Take 5  mg by mouth every 4 (four) hours as needed. For pain      . rOPINIRole (REQUIP) 1 MG tablet as needed.      Marland Kitchen spironolactone (ALDACTONE) 25 MG tablet Take 25 mg by mouth daily.      . VOLTAREN 1 % GEL Apply topically as needed.      . warfarin (COUMADIN) 7.5 MG tablet Take 3.75-7.5 mg by mouth every evening. 1/2 tablet (3.75 mg) on Monday, 1 tablete (7.5 mg) on all other days  30 tablet  11   No current facility-administered medications for this visit.    Physical Exam: Filed Vitals:   02/23/13 1545  BP: 148/76  Pulse: 94  Height: 5\' 6"  (1.676 m)  Weight: 220 lb 12.8 oz (100.154 kg)    GEN- The patient is well appearing, alert and oriented x 3 today.   Head- normocephalic, atraumatic Eyes-  Sclera clear, conjunctiva pink Ears- hearing intact Oropharynx- clear Lungs- Clear to ausculation bilaterally, normal work of breathing Chest- ICD pocket is well healed Heart- Regular rate and rhythm, no murmurs, rubs or gallops, PMI not  laterally displaced GI- soft, NT, ND, + BS Extremities- no clubbing, cyanosis, or edema  ICD interrogation- reviewed in detail today,  See PACEART report  Assessment and Plan:  1. Chronic systolic dysfunction/ nonischemic CM She has responded to CRT with normalization of her EF by echo 2012. She has reached EOS.  Given her clinical benefit, I would recommend generator replacement at this time. Risks, benefits, and alternatives to BIV ICD replacement were discussed with the patient who wishes to proceed. We will schedule the procedure at the next available time.  2. Prior PTE Continue coumadin Stop ASA  3. HTN Stable No change required today

## 2013-02-23 NOTE — Patient Instructions (Addendum)
GENERATOR CHANGE ON DEFIB Your physician has recommended you make the following change in your medication: STOP ASPIRIN  Your physician recommends that you return for lab work in:  7 DAYS BEFORE PROCEDURE BMET CBC  INR  DX V72.81

## 2013-02-28 LAB — ICD DEVICE OBSERVATION
TZAT-0001ATACH: 2
TZAT-0001ATACH: 3
TZAT-0001FASTVT: 1
TZAT-0001SLOWVT: 1
TZAT-0001SLOWVT: 2
TZAT-0002ATACH: NEGATIVE
TZAT-0002FASTVT: NEGATIVE
TZAT-0004SLOWVT: 8
TZAT-0004SLOWVT: 8
TZAT-0012ATACH: 150 ms
TZAT-0012ATACH: 150 ms
TZAT-0012FASTVT: 200 ms
TZAT-0013SLOWVT: 2
TZAT-0013SLOWVT: 2
TZAT-0018ATACH: NEGATIVE
TZAT-0018ATACH: NEGATIVE
TZAT-0018ATACH: NEGATIVE
TZAT-0018FASTVT: NEGATIVE
TZAT-0018SLOWVT: NEGATIVE
TZAT-0019FASTVT: 8 V
TZAT-0020ATACH: 1.5 ms
TZAT-0020SLOWVT: 1.5 ms
TZAT-0020SLOWVT: 1.5 ms
TZON-0003VSLOWVT: 450 ms
TZON-0004SLOWVT: 16
TZON-0004VSLOWVT: 20
TZST-0001ATACH: 6
TZST-0001FASTVT: 4
TZST-0001FASTVT: 5
TZST-0001FASTVT: 6
TZST-0001SLOWVT: 3
TZST-0001SLOWVT: 5
TZST-0002ATACH: NEGATIVE
TZST-0002ATACH: NEGATIVE
TZST-0002FASTVT: NEGATIVE
TZST-0002FASTVT: NEGATIVE
TZST-0002FASTVT: NEGATIVE
TZST-0003SLOWVT: 35 J

## 2013-03-06 ENCOUNTER — Encounter: Payer: Self-pay | Admitting: Cardiology

## 2013-03-13 ENCOUNTER — Other Ambulatory Visit (INDEPENDENT_AMBULATORY_CARE_PROVIDER_SITE_OTHER): Payer: Medicare Other

## 2013-03-13 DIAGNOSIS — Z01810 Encounter for preprocedural cardiovascular examination: Secondary | ICD-10-CM

## 2013-03-13 DIAGNOSIS — Z7901 Long term (current) use of anticoagulants: Secondary | ICD-10-CM

## 2013-03-13 LAB — BASIC METABOLIC PANEL
CO2: 28 mEq/L (ref 19–32)
Chloride: 95 mEq/L — ABNORMAL LOW (ref 96–112)
Creatinine, Ser: 2 mg/dL — ABNORMAL HIGH (ref 0.4–1.2)
Potassium: 3.5 mEq/L (ref 3.5–5.1)
Sodium: 133 mEq/L — ABNORMAL LOW (ref 135–145)

## 2013-03-13 LAB — CBC WITH DIFFERENTIAL/PLATELET
Basophils Absolute: 0 10*3/uL (ref 0.0–0.1)
Hemoglobin: 13.5 g/dL (ref 12.0–15.0)
Lymphocytes Relative: 11.1 % — ABNORMAL LOW (ref 12.0–46.0)
Monocytes Relative: 5.5 % (ref 3.0–12.0)
Neutro Abs: 12.3 10*3/uL — ABNORMAL HIGH (ref 1.4–7.7)
Neutrophils Relative %: 82.6 % — ABNORMAL HIGH (ref 43.0–77.0)
RDW: 13.8 % (ref 11.5–14.6)

## 2013-03-13 LAB — PROTIME-INR: INR: 4.7 ratio — ABNORMAL HIGH (ref 0.8–1.0)

## 2013-03-16 ENCOUNTER — Encounter (HOSPITAL_COMMUNITY): Payer: Self-pay | Admitting: Pharmacy Technician

## 2013-03-17 MED ORDER — SODIUM CHLORIDE 0.9 % IR SOLN
80.0000 mg | Status: DC
  Filled 2013-03-17: qty 2

## 2013-03-17 MED ORDER — VANCOMYCIN HCL 10 G IV SOLR
1500.0000 mg | INTRAVENOUS | Status: DC
  Filled 2013-03-17: qty 1500

## 2013-03-18 ENCOUNTER — Encounter (HOSPITAL_COMMUNITY)
Admission: RE | Disposition: A | Discharge status: Home / Self Care | Payer: Self-pay | Source: Ambulatory Visit | Attending: Internal Medicine

## 2013-03-18 ENCOUNTER — Ambulatory Visit (HOSPITAL_COMMUNITY)
Admission: RE | Admit: 2013-03-18 | Discharge: 2013-03-18 | Disposition: A | Discharge status: Home / Self Care | Payer: Medicare Other | Source: Ambulatory Visit | Attending: Internal Medicine | Admitting: Internal Medicine

## 2013-03-18 ENCOUNTER — Encounter (HOSPITAL_COMMUNITY): Payer: Self-pay | Admitting: *Deleted

## 2013-03-18 DIAGNOSIS — Z7982 Long term (current) use of aspirin: Secondary | ICD-10-CM | POA: Insufficient documentation

## 2013-03-18 DIAGNOSIS — I428 Other cardiomyopathies: Secondary | ICD-10-CM | POA: Insufficient documentation

## 2013-03-18 DIAGNOSIS — I519 Heart disease, unspecified: Secondary | ICD-10-CM | POA: Diagnosis present

## 2013-03-18 DIAGNOSIS — Z4502 Encounter for adjustment and management of automatic implantable cardiac defibrillator: Secondary | ICD-10-CM | POA: Insufficient documentation

## 2013-03-18 DIAGNOSIS — E785 Hyperlipidemia, unspecified: Secondary | ICD-10-CM | POA: Insufficient documentation

## 2013-03-18 DIAGNOSIS — J45909 Unspecified asthma, uncomplicated: Secondary | ICD-10-CM | POA: Insufficient documentation

## 2013-03-18 DIAGNOSIS — N189 Chronic kidney disease, unspecified: Secondary | ICD-10-CM | POA: Insufficient documentation

## 2013-03-18 DIAGNOSIS — Z7901 Long term (current) use of anticoagulants: Secondary | ICD-10-CM | POA: Insufficient documentation

## 2013-03-18 DIAGNOSIS — I129 Hypertensive chronic kidney disease with stage 1 through stage 4 chronic kidney disease, or unspecified chronic kidney disease: Secondary | ICD-10-CM | POA: Insufficient documentation

## 2013-03-18 DIAGNOSIS — I447 Left bundle-branch block, unspecified: Secondary | ICD-10-CM | POA: Insufficient documentation

## 2013-03-18 DIAGNOSIS — I509 Heart failure, unspecified: Secondary | ICD-10-CM | POA: Insufficient documentation

## 2013-03-18 DIAGNOSIS — E119 Type 2 diabetes mellitus without complications: Secondary | ICD-10-CM | POA: Insufficient documentation

## 2013-03-18 DIAGNOSIS — Z79899 Other long term (current) drug therapy: Secondary | ICD-10-CM | POA: Insufficient documentation

## 2013-03-18 DIAGNOSIS — M329 Systemic lupus erythematosus, unspecified: Secondary | ICD-10-CM | POA: Insufficient documentation

## 2013-03-18 DIAGNOSIS — Z86711 Personal history of pulmonary embolism: Secondary | ICD-10-CM | POA: Insufficient documentation

## 2013-03-18 DIAGNOSIS — I5022 Chronic systolic (congestive) heart failure: Secondary | ICD-10-CM | POA: Insufficient documentation

## 2013-03-18 HISTORY — PX: IMPLANTABLE CARDIOVERTER DEFIBRILLATOR GENERATOR CHANGE: SHX5474

## 2013-03-18 LAB — GLUCOSE, CAPILLARY: Glucose-Capillary: 202 mg/dL — ABNORMAL HIGH (ref 70–99)

## 2013-03-18 LAB — SURGICAL PCR SCREEN
MRSA, PCR: NEGATIVE
Staphylococcus aureus: NEGATIVE

## 2013-03-18 LAB — PROTIME-INR
INR: 1.51 — ABNORMAL HIGH (ref 0.00–1.49)
Prothrombin Time: 17.8 seconds — ABNORMAL HIGH (ref 11.6–15.2)

## 2013-03-18 SURGERY — IMPLANTABLE CARDIOVERTER DEFIBRILLATOR GENERATOR CHANGE
Anesthesia: LOCAL

## 2013-03-18 MED ORDER — FENTANYL CITRATE 0.05 MG/ML IJ SOLN
INTRAMUSCULAR | Status: AC
  Filled 2013-03-18: qty 2

## 2013-03-18 MED ORDER — SODIUM CHLORIDE 0.9 % IV SOLN: 250.0000 mL | INTRAVENOUS | Status: DC | PRN

## 2013-03-18 MED ORDER — MUPIROCIN 2 % EX OINT
TOPICAL_OINTMENT | CUTANEOUS | Status: AC
  Administered 2013-03-18: 1 via NASAL
  Filled 2013-03-18: qty 22

## 2013-03-18 MED ORDER — LIDOCAINE HCL (PF) 1 % IJ SOLN
INTRAMUSCULAR | Status: AC
  Filled 2013-03-18: qty 60

## 2013-03-18 MED ORDER — MIDAZOLAM HCL 5 MG/5ML IJ SOLN
INTRAMUSCULAR | Status: AC
  Filled 2013-03-18: qty 5

## 2013-03-18 MED ORDER — SODIUM CHLORIDE 0.9 % IJ SOLN: 3.0000 mL | Freq: Two times a day (BID) | INTRAMUSCULAR | Status: DC

## 2013-03-18 MED ORDER — SODIUM CHLORIDE 0.9 % IV SOLN
INTRAVENOUS | Status: DC
  Administered 2013-03-18: 09:00:00 via INTRAVENOUS

## 2013-03-18 MED ORDER — ONDANSETRON HCL 4 MG/2ML IJ SOLN: 4.0000 mg | Freq: Four times a day (QID) | INTRAMUSCULAR | Status: DC | PRN

## 2013-03-18 MED ORDER — MUPIROCIN 2 % EX OINT
TOPICAL_OINTMENT | Freq: Two times a day (BID) | CUTANEOUS | Status: DC
  Filled 2013-03-18: qty 22

## 2013-03-18 MED ORDER — SODIUM CHLORIDE 0.9 % IJ SOLN: 3.0000 mL | INTRAMUSCULAR | Status: DC | PRN

## 2013-03-18 MED ORDER — ACETAMINOPHEN 325 MG PO TABS
325.0000 mg | ORAL_TABLET | ORAL | Status: DC | PRN
  Filled 2013-03-18: qty 2

## 2013-03-18 MED ORDER — CHLORHEXIDINE GLUCONATE 4 % EX LIQD
60.0000 mL | Freq: Once | CUTANEOUS | Status: DC
  Filled 2013-03-18: qty 60

## 2013-03-18 MED ORDER — HYDROCODONE-ACETAMINOPHEN 5-325 MG PO TABS: 1.0000 | ORAL_TABLET | ORAL | Status: DC | PRN

## 2013-03-18 NOTE — Op Note (Signed)
SURGEON:  Hillis Range, MD      PREPROCEDURE DIAGNOSES:   1. Nonschemic cardiomyopathy.   2. New York Heart Association class II, heart failure chronically.   3. LBBB     POSTPROCEDURE DIAGNOSES:   1. Nonschemic cardiomyopathy.   2. New York Heart Association class II, heart failure chronically.   3. LBBB   PROCEDURES:    1.  ICD pulse generator replacement   2. Skin pocket revision     INTRODUCTION:  [name] is a 67 y.o. female with a nonischemic CM and LBBB s/p prior BiV ICD implantation with robust response to resynchronization therapy who presents today for BiVICD pulse generator replacement for ERI battery status.        DESCRIPTION OF PROCEDURE:  Informed written consent was obtained and the patient was brought to the electrophysiology lab in the fasting state.   The device was interrogated and confirmed to be at [facility] battery status.  She required no sedation for the procedure today.  The patient's left chest was prepped and draped in the usual sterile fashion by the EP lab staff.  The skin overlying the left deltopectoral region was infiltrated with lidocaine for local analgesia.  A 5-cm incision was made over the existing ICD pocket.  Electrocautery was used to assure hemostasis.  The device was exposed and removed from the pocket. A single silk stitch was identified and removed which previously secured the device within the pocket. The device was disconnected from the leads.  The leads were examined thoroughly and their integrity confirmed to be intact.   The right atrial lead was confirmed to be a Medtronic, model [id number]  (serial # [id number]) lead implanted 08/12/07.  The right ventricular lead was confirmed to be a Medtronic model [id number] (serial number [id number]) right ventricular defibrillator lead  implanted 08/12/07.  The left ventricular lead was confirmed to be a Medtronic model [id number] (serial number [id number]) lead implanted 08/12/07.    Atrial lead P-waves  measured 3.6 mV with an impedance of 399 ohms and a threshold of 0.5 volts at 0.4 milliseconds.  The right ventricular lead R-wave measured 17.9 mV with impedance of 665 ohms and a threshold of 0.75 volts at 0.4 milliseconds. LV lead impedance was 608 ohms with a threshold of 1.5V @ 0.68msec. The leads were then connected to a Medtronic Viva XR CRT-D model DTBA1D1 (SN [id number]) BiV ICD. The pocket was revised to accomodate this new device. The pocket was  irrigated with copious gentamicin solution.  The defibrillator was placed into the  Pocket and secured to the pectoralis fascia using #2 silk suture.  The pocket was then closed in 2 layers with 2.0 Vicryl suture  for the subcutaneous and subcuticular layers.  Steri-Strips and a  sterile dressing were then applied.   Defibrillation Threshold testing was not performed today.  There were no early apparent complications.     CONCLUSIONS:   1. Nonischemic cardiomyopathy with robust response to CRT previously   2. ICD at elective replacement indicator  3. Successful BiV ICD pulse generator replacement   4. No early apparent complications.   Fayrene Fearing Nefertari Rebman,MD

## 2013-03-18 NOTE — H&P (View-Only) (Signed)
PCP: GURLEY,CHRISTIAN SCOTT, PA-C Primary Cardiologist: Traci Turner, MD  [name] is a 66 y.o. female who presents today for routine electrophysiology followup.  Since last being seen in our clinic, the patient reports doing very well.    She has not had her device checked since November of 2012.  Her device is at EOS today (triggered ERI 05/2012).  Today, she denies symptoms of palpitations, chest pain, shortness of breath,  lower extremity edema, dizziness, presyncope, syncope, or ICD shocks.  The patient is otherwise without complaint today.   Past Medical History  Diagnosis Date  . Asthma   . Depression   . Sjogren's syndrome   . Lupus   . Chronic systolic dysfunction of left ventricle   . Nonischemic cardiomyopathy   . Chronic renal insufficiency   . Diabetes mellitus   . Hypertension   . Hyperlipidemia   . Pulmonary embolism     previously on coumadin  . LBBB (left bundle branch block)    Past Surgical History  Procedure Laterality Date  . Mastectomy  1982  . Angioplasty  2002  . Hysterectomy (other)    . Biventricular defibrillator implantation  08/12/07    MDT Concerto implanted by Dr Edmunds    Current Outpatient Prescriptions  Medication Sig Dispense Refill  . aspirin 325 MG tablet Take 325 mg by mouth daily.        . atorvastatin (LIPITOR) 80 MG tablet Take 40 mg by mouth daily.       . buPROPion (WELLBUTRIN XL) 150 MG 24 hr tablet Take 450 mg by mouth daily.      . carisoprodol (SOMA) 350 MG tablet Take 350 mg by mouth as needed.      . carvedilol (COREG) 3.125 MG tablet Take 3.125 mg by mouth 2 (two) times daily with a meal.        . Cholecalciferol (VITAMIN D) 2000 UNITS tablet Take 2,000 Units by mouth every evening.      . diltiazem (TIAZAC) 240 MG 24 hr capsule Take 240 mg by mouth every evening.      . DULoxetine (CYMBALTA) 60 MG capsule Take 60 mg by mouth every evening. Take 2 tabs in the evening      . ezetimibe (ZETIA) 10 MG tablet Take 10 mg  by mouth at bedtime.      . ezetimibe-simvastatin (VYTORIN) 10-40 MG per tablet Take 1 tablet by mouth at bedtime.        . furosemide (LASIX) 40 MG tablet Take 40 mg by mouth 2 (two) times daily. Take one tab in morning and one tab at night      . HYDROcodone-acetaminophen (VICODIN) 5-500 MG per tablet Take 1 tablet by mouth 3 (three) times daily as needed. For pain      . isosorbide dinitrate (ISORDIL) 30 MG tablet Take 30 mg by mouth daily.        . levothyroxine (SYNTHROID, LEVOTHROID) 100 MCG tablet Take 100 mcg by mouth See admin instructions. Take on Monday, Wednesday, Friday and Sunday (88 mcg on other days)      . levothyroxine (SYNTHROID, LEVOTHROID) 88 MCG tablet Take 88 mcg by mouth See admin instructions. Take on Tuesday, Thursday and Saturday (100 mcg on other days)      . LORazepam (ATIVAN) 1 MG tablet Take 1 mg by mouth 3 (three) times daily as needed. For anxiety or sleep      . oxyCODONE (OXY IR/ROXICODONE) 5 MG immediate release tablet Take 5   mg by mouth every 4 (four) hours as needed. For pain      . rOPINIRole (REQUIP) 1 MG tablet as needed.      . spironolactone (ALDACTONE) 25 MG tablet Take 25 mg by mouth daily.      . VOLTAREN 1 % GEL Apply topically as needed.      . warfarin (COUMADIN) 7.5 MG tablet Take 3.75-7.5 mg by mouth every evening. 1/2 tablet (3.75 mg) on Monday, 1 tablete (7.5 mg) on all other days  30 tablet  11   No current facility-administered medications for this visit.    Physical Exam: Filed Vitals:   02/23/13 1545  BP: 148/76  Pulse: 94  Height: 5' 6" (1.676 m)  Weight: 220 lb 12.8 oz (100.154 kg)    GEN- The patient is well appearing, alert and oriented x 3 today.   Head- normocephalic, atraumatic Eyes-  Sclera clear, conjunctiva pink Ears- hearing intact Oropharynx- clear Lungs- Clear to ausculation bilaterally, normal work of breathing Chest- ICD pocket is well healed Heart- Regular rate and rhythm, no murmurs, rubs or gallops, PMI not  laterally displaced GI- soft, NT, ND, + BS Extremities- no clubbing, cyanosis, or edema  ICD interrogation- reviewed in detail today,  See PACEART report  Assessment and Plan:  1. Chronic systolic dysfunction/ nonischemic CM She has responded to CRT with normalization of her EF by echo 2012. She has reached EOS.  Given her clinical benefit, I would recommend generator replacement at this time. Risks, benefits, and alternatives to BIV ICD replacement were discussed with the patient who wishes to proceed. We will schedule the procedure at the next available time.  2. Prior PTE Continue coumadin Stop ASA  3. HTN Stable No change required today      

## 2013-03-18 NOTE — Progress Notes (Signed)
ICD Criteria  Current LVEF (within 6 months):55%  NYHA Functional Classification: Class II  Heart Failure History:  Yes, Duration of heart failure since onset is > 9 months  Non-Ischemic Dilated Cardiomyopathy History:  Yes, timeframe is > 9 months  Atrial Fibrillation/Atrial Flutter:  No.  Ventricular Tachycardia History:  No.  Cardiac Arrest History:  No  History of Syndromes with Risk of Sudden Death:  No.  Previous ICD:  Yes, ICD Type:  CRT-D, Reason for ICD:  Primary prevention.  15%  Electrophysiology Study: No.  Anticoagulation Therapy:  Patient is on anticoagulation therapy, anticoagulation was held prior to procedure.   Beta Blocker Therapy:  Yes.   Ace Inhibitor/ARB Therapy:  No due to renal failure.  She is on imdur/hydralazine   BIV ICD generator change.  She has responded to CRT with improvement in EF.  Per appropriate use criteria, she will proceed with ICD generator change at this time.

## 2013-03-18 NOTE — Interval H&P Note (Signed)
History and Physical Interval Note:  03/18/2013 9:39 AM  [name]  has presented today for surgery, with the diagnosis of end of life generator   The various methods of treatment have been discussed with the patient and family. After consideration of risks, benefits and other options for treatment, the patient has consented to  Procedure(s): IMPLANTABLE CARDIOVERTER DEFIBRILLATOR GENERATOR CHANGE (N/A) as a surgical intervention .  The patient's history has been reviewed, patient examined, no change in status, stable for surgery.  I have reviewed the patient's chart and labs.  Questions were answered to the patient's satisfaction.     Hillis Range

## 2013-03-30 ENCOUNTER — Ambulatory Visit: Payer: Medicare Other

## 2013-04-01 ENCOUNTER — Ambulatory Visit (INDEPENDENT_AMBULATORY_CARE_PROVIDER_SITE_OTHER): Payer: Medicare Other | Admitting: *Deleted

## 2013-04-01 DIAGNOSIS — I519 Heart disease, unspecified: Secondary | ICD-10-CM

## 2013-04-01 DIAGNOSIS — I428 Other cardiomyopathies: Secondary | ICD-10-CM

## 2013-04-01 LAB — ICD DEVICE OBSERVATION
AL AMPLITUDE: 3.1 mv
AL IMPEDENCE ICD: 437 Ohm
RV LEAD AMPLITUDE: 15.6 mv
RV LEAD IMPEDENCE ICD: 703 Ohm
VENTRICULAR PACING ICD: 99.4 pct

## 2013-04-01 NOTE — Progress Notes (Signed)
Wound check-ICD in office. 

## 2013-04-27 ENCOUNTER — Encounter: Payer: Self-pay | Admitting: Internal Medicine

## 2013-05-26 ENCOUNTER — Encounter: Payer: Self-pay | Admitting: Cardiology

## 2013-05-26 DIAGNOSIS — Z7901 Long term (current) use of anticoagulants: Secondary | ICD-10-CM | POA: Insufficient documentation

## 2013-05-26 DIAGNOSIS — I2510 Atherosclerotic heart disease of native coronary artery without angina pectoris: Secondary | ICD-10-CM | POA: Insufficient documentation

## 2013-05-26 DIAGNOSIS — I480 Paroxysmal atrial fibrillation: Secondary | ICD-10-CM | POA: Insufficient documentation

## 2013-05-26 DIAGNOSIS — I255 Ischemic cardiomyopathy: Secondary | ICD-10-CM | POA: Insufficient documentation

## 2013-05-27 ENCOUNTER — Telehealth: Payer: Self-pay | Admitting: Internal Medicine

## 2013-05-27 ENCOUNTER — Ambulatory Visit: Payer: Medicare Other | Admitting: Cardiology

## 2013-05-27 NOTE — Telephone Encounter (Signed)
LEFT MESSAGE  EF  WAS  55-60 %  BACK  IN  08-2010./CY

## 2013-05-27 NOTE — Telephone Encounter (Signed)
New Problem  In need of ejection fraction for the Heart Failure Program. They ask that you leave the message on the secured VM.

## 2013-05-28 ENCOUNTER — Ambulatory Visit: Payer: Medicare Other | Admitting: Cardiology

## 2013-06-04 ENCOUNTER — Ambulatory Visit: Payer: Medicare Other | Admitting: Cardiology

## 2013-06-18 ENCOUNTER — Encounter: Payer: Medicare Other | Admitting: Internal Medicine

## 2013-06-19 ENCOUNTER — Ambulatory Visit (INDEPENDENT_AMBULATORY_CARE_PROVIDER_SITE_OTHER): Payer: Medicare Other | Admitting: Pharmacist

## 2013-06-19 DIAGNOSIS — I2699 Other pulmonary embolism without acute cor pulmonale: Secondary | ICD-10-CM | POA: Insufficient documentation

## 2013-06-19 LAB — POCT INR: INR: 1.8

## 2013-06-22 ENCOUNTER — Ambulatory Visit: Payer: Medicare Other | Admitting: Cardiology

## 2013-06-24 ENCOUNTER — Other Ambulatory Visit: Payer: Self-pay | Admitting: Family Medicine

## 2013-06-24 DIAGNOSIS — M5430 Sciatica, unspecified side: Secondary | ICD-10-CM

## 2013-06-30 ENCOUNTER — Ambulatory Visit
Admission: RE | Admit: 2013-06-30 | Discharge: 2013-06-30 | Disposition: A | Discharge status: Home / Self Care | Payer: Medicare Other | Source: Ambulatory Visit | Attending: Family Medicine | Admitting: Family Medicine

## 2013-06-30 DIAGNOSIS — M5430 Sciatica, unspecified side: Secondary | ICD-10-CM

## 2013-07-09 ENCOUNTER — Encounter (INDEPENDENT_AMBULATORY_CARE_PROVIDER_SITE_OTHER): Payer: Self-pay

## 2013-07-09 ENCOUNTER — Encounter: Payer: Self-pay | Admitting: Internal Medicine

## 2013-07-09 ENCOUNTER — Ambulatory Visit (INDEPENDENT_AMBULATORY_CARE_PROVIDER_SITE_OTHER): Payer: Medicare Other | Admitting: Internal Medicine

## 2013-07-09 ENCOUNTER — Ambulatory Visit (INDEPENDENT_AMBULATORY_CARE_PROVIDER_SITE_OTHER): Payer: Medicare Other | Admitting: Pharmacist

## 2013-07-09 VITALS — BP 134/85 | HR 97 | Ht 66.0 in | Wt 215.8 lb

## 2013-07-09 DIAGNOSIS — Z7901 Long term (current) use of anticoagulants: Secondary | ICD-10-CM

## 2013-07-09 DIAGNOSIS — I2699 Other pulmonary embolism without acute cor pulmonale: Secondary | ICD-10-CM

## 2013-07-09 DIAGNOSIS — I10 Essential (primary) hypertension: Secondary | ICD-10-CM

## 2013-07-09 DIAGNOSIS — I255 Ischemic cardiomyopathy: Secondary | ICD-10-CM

## 2013-07-09 DIAGNOSIS — I4891 Unspecified atrial fibrillation: Secondary | ICD-10-CM

## 2013-07-09 DIAGNOSIS — I519 Heart disease, unspecified: Secondary | ICD-10-CM

## 2013-07-09 DIAGNOSIS — I2589 Other forms of chronic ischemic heart disease: Secondary | ICD-10-CM

## 2013-07-09 DIAGNOSIS — I2510 Atherosclerotic heart disease of native coronary artery without angina pectoris: Secondary | ICD-10-CM

## 2013-07-09 LAB — MDC_IDC_ENUM_SESS_TYPE_INCLINIC
Battery Remaining Longevity: 100 mo
Battery Voltage: 3.08 V
Brady Statistic AP VP Percent: 0.04 %
Brady Statistic AS VS Percent: 1.12 %
Brady Statistic RV Percent Paced: 98.31 %
HighPow Impedance: 152 Ohm
HighPow Impedance: 43 Ohm
HighPow Impedance: 49 Ohm
Lead Channel Impedance Value: 456 Ohm
Lead Channel Pacing Threshold Amplitude: 1 V
Lead Channel Pacing Threshold Pulse Width: 0.4 ms
Lead Channel Sensing Intrinsic Amplitude: 18.6 mV
Lead Channel Setting Pacing Amplitude: 1.5 V
Lead Channel Setting Pacing Amplitude: 2 V
Lead Channel Setting Pacing Amplitude: 2.25 V
Lead Channel Setting Pacing Pulse Width: 0.4 ms
Lead Channel Setting Pacing Pulse Width: 0.4 ms
Zone Setting Detection Interval: 320 ms
Zone Setting Detection Interval: 400 ms
Zone Setting Detection Interval: 450 ms

## 2013-07-09 LAB — POCT INR: INR: 1.2

## 2013-07-09 NOTE — Patient Instructions (Addendum)
Your physician wants you to follow-up in: 02/2014 with [name] Bonita Quin will receive a reminder letter in the mail two months in advance. If you don't receive a letter, please call our office to schedule the follow-up appointment.   Remote monitoring is used to monitor your Pacemaker or ICD from home. This monitoring reduces the number of office visits required to check your device to one time per year. It allows [place] to keep an eye on the functioning of your device to ensure it is working properly. You are scheduled for a device check from home on 10/12/13. You may send your transmission at any time that day. If you have a wireless device, the transmission will be sent automatically. After your physician reviews your transmission, you will receive a postcard with your next transmission date.

## 2013-07-09 NOTE — Progress Notes (Signed)
PCP: Delphia Grates Primary Cardiologist: [name], MD  [name] is a 67 y.o. female who presents today for routine electrophysiology followup.  Since last being seen in our clinic, the patient reports doing very well.   Her primary concern is with back pain.  Today, she denies symptoms of palpitations, chest pain, shortness of breath,  lower extremity edema, dizziness, presyncope, syncope, or ICD shocks.  The patient is otherwise without complaint today.   Past Medical History  Diagnosis Date  . Asthma   . Depression   . Sjogren's syndrome   . Lupus   . Chronic systolic dysfunction of left ventricle   . Chronic renal insufficiency   . Diabetes mellitus   . Hypertension   . Hyperlipidemia   . Pulmonary embolism     previously on coumadin  . Chronic anticoagulation   . Obesity   . Ischemic dilated cardiomyopathy     s/p BiV AICD - EF now 49%  . Fibromyalgia   . Coronary artery disease 2002    s/p PCI of RCA  . Psoriasis   . RA (rheumatoid arthritis)   . LBBB (left bundle branch block)   . Atrial fibrillation   . Stroke     TIA   Past Surgical History  Procedure Laterality Date  . Mastectomy  1982  . Angioplasty  2002  . Hysterectomy (other)    . Biventricular defibrillator implantation  08/12/07    MDT Concerto implanted by Dr Amil Amen; generator change 03-18-2013 by [name]  . Biv pacemaker generator change out  03/18/2013    MDT Viva XR BIV ICD generator change by [name]    Current Outpatient Prescriptions  Medication Sig Dispense Refill  . aspirin 325 MG tablet Take 325 mg by mouth daily.       Marland Kitchen atorvastatin (LIPITOR) 80 MG tablet Take 40 mg by mouth daily.       . carisoprodol (SOMA) 350 MG tablet Take 350 mg by mouth daily as needed for muscle spasms (pain).       . carvedilol (COREG) 3.125 MG tablet Take 3.125 mg by mouth 2 (two) times daily with a meal.        . Cholecalciferol (VITAMIN D) 2000 UNITS tablet Take 2,000 Units by mouth every  evening.      . diclofenac sodium (VOLTAREN) 1 % GEL Apply 1 application topically daily as needed (pain).      Marland Kitchen diltiazem (TIAZAC) 240 MG 24 hr capsule Take 240 mg by mouth every evening.      . diphenhydrAMINE (BENADRYL) 25 MG tablet Take 25 mg by mouth daily as needed for allergies.      Marland Kitchen ezetimibe (ZETIA) 10 MG tablet Take 10 mg by mouth at bedtime.      Marland Kitchen ezetimibe-simvastatin (VYTORIN) 10-40 MG per tablet Take 1 tablet by mouth at bedtime.        . furosemide (LASIX) 40 MG tablet Take 40 mg by mouth 2 (two) times daily.       Marland Kitchen HYDROcodone-acetaminophen (NORCO) 10-325 MG per tablet Take 1 tablet by mouth every 6 (six) hours as needed for pain.      . isosorbide dinitrate (ISORDIL) 30 MG tablet Take 30 mg by mouth daily.        Marland Kitchen levothyroxine (SYNTHROID, LEVOTHROID) 200 MCG tablet Take 200 mcg by mouth daily before breakfast.      . LORazepam (ATIVAN) 1 MG tablet Take 1 mg by mouth 3 (three) times daily  as needed for anxiety (sleep).       Marland Kitchen oxyCODONE-acetaminophen (PERCOCET/ROXICET) 5-325 MG per tablet Take 1 tablet by mouth every 6 (six) hours as needed.      Marland Kitchen rOPINIRole (REQUIP) 1 MG tablet Take 1 mg by mouth at bedtime as needed (sleep/restless legs).       Marland Kitchen spironolactone (ALDACTONE) 25 MG tablet Take 25 mg by mouth daily.      Marland Kitchen venlafaxine XR (EFFEXOR-XR) 150 MG 24 hr capsule Take 1 capsule by mouth daily.      Marland Kitchen warfarin (COUMADIN) 7.5 MG tablet Take 3.75-7.5 mg by mouth every evening. 1/2 tablet (3.75 mg) on Monday, 1 tablet (7.5 mg) on all other days  30 tablet  11   No current facility-administered medications for this visit.    Physical Exam: Filed Vitals:   07/09/13 1025  BP: 134/85  Pulse: 97  Height: 5\' 6"  (1.676 m)  Weight: 215 lb 12.8 oz (97.886 kg)    GEN- The patient is well appearing, alert and oriented x 3 today.   Head- normocephalic, atraumatic Eyes-  Sclera clear, conjunctiva pink Ears- hearing intact Oropharynx- clear Lungs- Clear to ausculation  bilaterally, normal work of breathing Chest- ICD pocket is well healed Heart- Regular rate and rhythm, no murmurs, rubs or gallops, PMI not laterally displaced GI- soft, NT, ND, + BS Extremities- no clubbing, cyanosis, or edema  ICD interrogation- reviewed in detail today,  See PACEART report  Assessment and Plan:  1. Chronic systolic dysfunction/ nonischemic CM She has responded to CRT with normalization of her EF by echo 2012. Normal BiV ICD function See Pace Art report No changes today  2. Prior PTE INRs have been labile I would recommend switching to xarelto. Her CrCl is 40s.  The correct dosing for this indication is 20mg  daily. She will discuss this additionally with Dr Mayford Knife tomorrow She should decrease or stop her ASA.  I will defer to Dr Mayford Knife  3. HTN Stable No change required today  4. CAD Consider stopping ASA  5. CRI She is due to follow-up with her nephrologist in [facility]  She has follow-up with Dr Mayford Knife tomorrow [facility] I will see again in a year

## 2013-07-10 ENCOUNTER — Ambulatory Visit: Payer: Medicare Other | Admitting: Cardiology

## 2013-07-13 ENCOUNTER — Other Ambulatory Visit: Payer: Self-pay | Admitting: Cardiology

## 2013-07-15 ENCOUNTER — Encounter: Payer: Self-pay | Admitting: Cardiology

## 2013-07-15 ENCOUNTER — Ambulatory Visit (INDEPENDENT_AMBULATORY_CARE_PROVIDER_SITE_OTHER): Payer: Medicare Other | Admitting: Cardiology

## 2013-07-15 ENCOUNTER — Ambulatory Visit (INDEPENDENT_AMBULATORY_CARE_PROVIDER_SITE_OTHER): Payer: Medicare Other | Admitting: *Deleted

## 2013-07-15 VITALS — BP 130/83 | HR 109 | Ht 66.0 in | Wt 216.4 lb

## 2013-07-15 DIAGNOSIS — Z7901 Long term (current) use of anticoagulants: Secondary | ICD-10-CM

## 2013-07-15 DIAGNOSIS — I2699 Other pulmonary embolism without acute cor pulmonale: Secondary | ICD-10-CM

## 2013-07-15 DIAGNOSIS — I2589 Other forms of chronic ischemic heart disease: Secondary | ICD-10-CM

## 2013-07-15 DIAGNOSIS — I519 Heart disease, unspecified: Secondary | ICD-10-CM

## 2013-07-15 DIAGNOSIS — I10 Essential (primary) hypertension: Secondary | ICD-10-CM

## 2013-07-15 DIAGNOSIS — N189 Chronic kidney disease, unspecified: Secondary | ICD-10-CM

## 2013-07-15 DIAGNOSIS — I2510 Atherosclerotic heart disease of native coronary artery without angina pectoris: Secondary | ICD-10-CM

## 2013-07-15 DIAGNOSIS — E785 Hyperlipidemia, unspecified: Secondary | ICD-10-CM

## 2013-07-15 DIAGNOSIS — I255 Ischemic cardiomyopathy: Secondary | ICD-10-CM

## 2013-07-15 DIAGNOSIS — I480 Paroxysmal atrial fibrillation: Secondary | ICD-10-CM

## 2013-07-15 DIAGNOSIS — I4891 Unspecified atrial fibrillation: Secondary | ICD-10-CM

## 2013-07-15 MED ORDER — EZETIMIBE-ATORVASTATIN 10-40 MG PO TABS: 10.0000 mg | ORAL_TABLET | Freq: Every day | ORAL | Status: DC

## 2013-07-15 MED ORDER — HYDRALAZINE HCL 10 MG PO TABS: 10.0000 mg | ORAL_TABLET | Freq: Four times a day (QID) | ORAL | Status: DC

## 2013-07-15 NOTE — Patient Instructions (Addendum)
Your physician has recommended you make the following change in your medication: Stop Aspirin,Lipitor,Zetia, and Vytorin.Diamond KitchenMarland KitchenMarland KitchenRestart Hydralazine 10 MG Four times daily.... Start Liptruzet 10/40 MG Daily  We referred you over to [facility]. One of our offices will call you to make aware of the appointment we set up for you.  Your physician recommends that you return for lab work in: 8 weeks on 09/09/13 for fasting Lipid, ALT, and BMET  Your physician wants you to follow-up in: 6 Months with Dr Sherlyn Lick will receive a reminder letter in the mail two months in advance. If you don't receive a letter, please call our office to schedule the follow-up appointment.

## 2013-07-15 NOTE — Progress Notes (Signed)
[address], [address] [place], [place]  [address] Phone: [phone] Fax:  [phone]  Date:  07/15/2013   ID:  JAIONA SIMIEN, DOB 07-28-1946, MRN [id number]  PCP:  Delphia Grates  Cardiologist:  [name], MD     History of Present Illness: [name] is a 67 y.o. female with a history of ASCAD, HTN, dyslipidemia, ischemic DCM s/p BiVAICD EF 49% now, chronic anticoagulation for afib, LBBB and chronic systolic CHF who presents today for followup.  She is doing well.  She denies any chest pain, LE edema, dizziness, palpitations or syncope.  She has chronic SOB and DOE which are stable.     Wt Readings from Last 3 Encounters:  07/15/13 216 lb 6.4 oz (98.158 kg)  07/09/13 215 lb 12.8 oz (97.886 kg)  03/18/13 220 lb (99.791 kg)     Past Medical History  Diagnosis Date  . Asthma   . Depression   . Sjogren's syndrome   . Lupus   . Chronic systolic dysfunction of left ventricle   . Chronic renal insufficiency   . Diabetes mellitus   . Hypertension   . Hyperlipidemia   . Pulmonary embolism   . Chronic anticoagulation   . Obesity   . Ischemic dilated cardiomyopathy     s/p BiV AICD - EF now 49%  . Fibromyalgia   . Coronary artery disease 2002    s/p PCI of RCA  . Psoriasis   . RA (rheumatoid arthritis)   . LBBB (left bundle branch block)   . Atrial fibrillation   . Stroke     TIA    Current Outpatient Prescriptions  Medication Sig Dispense Refill  . aspirin 325 MG tablet Take 325 mg by mouth daily.       Marland Kitchen atorvastatin (LIPITOR) 80 MG tablet TAKE 1 TABLET BY MOUTH EVERY DAY  30 tablet  0  . carisoprodol (SOMA) 350 MG tablet Take 350 mg by mouth daily as needed for muscle spasms (pain).       . carvedilol (COREG) 3.125 MG tablet Take 3.125 mg by mouth 2 (two) times daily with a meal.        . Cholecalciferol (VITAMIN D) 2000 UNITS tablet Take 2,000 Units by mouth every evening.      . diclofenac sodium (VOLTAREN) 1 % GEL Apply 1 application topically  daily as needed (pain).      Marland Kitchen diltiazem (TIAZAC) 240 MG 24 hr capsule Take 240 mg by mouth every evening.      . diphenhydrAMINE (BENADRYL) 25 MG tablet Take 25 mg by mouth daily as needed for allergies.      Marland Kitchen ezetimibe (ZETIA) 10 MG tablet Take 10 mg by mouth at bedtime.      Marland Kitchen ezetimibe-simvastatin (VYTORIN) 10-40 MG per tablet Take 1 tablet by mouth at bedtime.        . furosemide (LASIX) 40 MG tablet Take 40 mg by mouth 2 (two) times daily.       Marland Kitchen HYDROcodone-acetaminophen (NORCO) 10-325 MG per tablet Take 1 tablet by mouth every 6 (six) hours as needed for pain.      . isosorbide dinitrate (ISORDIL) 30 MG tablet Take 30 mg by mouth daily.        Marland Kitchen levothyroxine (SYNTHROID, LEVOTHROID) 200 MCG tablet Take 200 mcg by mouth daily before breakfast.      . LORazepam (ATIVAN) 1 MG tablet Take 1 mg by mouth 3 (three) times daily as needed for  anxiety (sleep).       Marland Kitchen oxyCODONE-acetaminophen (PERCOCET/ROXICET) 5-325 MG per tablet Take 1 tablet by mouth every 6 (six) hours as needed.      Marland Kitchen rOPINIRole (REQUIP) 1 MG tablet Take 1 mg by mouth at bedtime as needed (sleep/restless legs).       Marland Kitchen spironolactone (ALDACTONE) 25 MG tablet Take 25 mg by mouth daily.      Marland Kitchen venlafaxine XR (EFFEXOR-XR) 150 MG 24 hr capsule Take 1 capsule by mouth daily.      Marland Kitchen warfarin (COUMADIN) 7.5 MG tablet Take 3.75-7.5 mg by mouth every evening. 1/2 tablet (3.75 mg) on Monday, 1 tablet (7.5 mg) on all other days  30 tablet  11   No current facility-administered medications for this visit.    Allergies:    Allergies  Allergen Reactions  . Contrast Media [Iodinated Diagnostic Agents] Hives and Itching  . Latex Rash  . Penicillins Rash  . Sulfonamide Derivatives Rash    Social History:  The patient  reports that she has quit smoking. She does not have any smokeless tobacco history on file. She reports that she does not drink alcohol or use illicit drugs.   Family History:  The patient's family history includes  Heart attack in her father; Heart disease in her father.   ROS:  Please see the history of present illness.      All other systems reviewed and negative.   PHYSICAL EXAM: VS:  BP 130/83  Pulse 109  Ht 5\' 6"  (1.676 m)  Wt 216 lb 6.4 oz (98.158 kg)  BMI 34.94 kg/m2 Well nourished, well developed, in no acute distress HEENT: normal Neck: no JVD Cardiac:  normal S1, S2; RRR; no murmur Lungs:  clear to auscultation bilaterally, no wheezing, rhonchi or rales Abd: soft, nontender, no hepatomegaly Ext: no edema Skin: warm and dry Neuro:  CNs 2-12 intact, no focal abnormalities noted       ASSESSMENT AND PLAN:  1. ASCAD with no angina  - continue Imdur  - stop ASA  2. HTN - controlled  - continue Coreg 3. Dyslipidemia  - She is going to switch over to Liptruzet in another week due to cost and I will get a fasting lipid and ALT in 8 weeks 4. Chronic systolic CHF - appears compensated  - continue coreg/Lasix/aldactone  - restart Hydralazine 10mg  QID which had fallen off her refill list 5. Ischemic DCM s/p BiVAICD 6. PAF maintaining NSR  - Continue Coreg/diltiazem/warfarin  Followup with me in 6 months    Signed, [name], MD 07/15/2013 11:00 AM

## 2013-07-20 ENCOUNTER — Telehealth: Payer: Self-pay | Admitting: Pharmacist

## 2013-07-20 NOTE — Telephone Encounter (Signed)
Message copied by Lou Miner on Mon Jul 20, 2013  4:55 PM ------      Message from: [name] R      Created: Mon Jul 20, 2013  4:31 PM      Regarding: RE: medication insurance coverage       Please advise       ----- Message -----         From: Carmela Hurt, RN         Sent: 07/20/2013  12:02 PM           To: Quintella Reichert, MD, Carmela Hurt, RN      Subject: medication insurance coverage                            Dr Mayford Knife      Got a fax from med insurance coverage  regarding the Ezetimibe-Atorvastatin 10-40 MG TABS 30 tablet 1 07/15/2013  Take 10-40 mg by mouth daily. - Oral      "this drug not covered, please select atorvastatin or crestor,  I know she was on lipitor per your note below. If she changes to crestor we have samples if the money reason is the donut hole, we can help her until next year.       Addison Lank, RN Patient Care Advocate            Your note:      She is going to switch over to Liptruzet in another week due to cost        ------

## 2013-07-20 NOTE — Telephone Encounter (Signed)
lvm for pt to return call °

## 2013-07-20 NOTE — Telephone Encounter (Signed)
Patient was on lipitor 80 mg qd and Zetia 10 mg qd for lipids.  Dr. Mayford Knife tried to change patient to combination of Liptruzet for cost savings reasons, however insurance didn't cover Liptruzet.  She is having a hard time affording meds.  [facility] manages most things for her.  Have her continue generic lipitor 80 mg daily and Zetia 10 mg daily.  If she can't afford Zetia at this time that is okay to not take, just make sure she is able to continue generic lipitor 80 mg daily.  Please notify patient.

## 2013-07-21 ENCOUNTER — Telehealth: Payer: Self-pay | Admitting: Cardiology

## 2013-07-21 NOTE — Telephone Encounter (Signed)
Returning call from yesterday.  Advised that Dr. Mayford Knife wants her to continue Lipitor 80 mg and if can afford Zetia 10 mg.  States she is Solicitor the first of the year and maybe insurance will cover meds.  She doesn't have enough Zetia to last till first of year.  Checked to see if had any samples but none available.  States she is coming in for coumadin check and advised her to ask  to see if have any samples then.  Otherwise will continue Lipitor.

## 2013-07-21 NOTE — Telephone Encounter (Signed)
Information given to Omar Person, Scheduling who states she will send referral to [place] at [facility].

## 2013-07-21 NOTE — Telephone Encounter (Signed)
Follow up    Janey Greaser @ [facility] called They received the records but NO referral form. Please send fax  #[phone]

## 2013-07-21 NOTE — Telephone Encounter (Signed)
Follow Up ° °Pt returned call//  °

## 2013-07-22 NOTE — Telephone Encounter (Signed)
New problem   Please call Rhonda concerning referral.

## 2013-07-22 NOTE — Telephone Encounter (Signed)
lvm for a return call

## 2013-07-23 NOTE — Telephone Encounter (Signed)
LVM for pt to return call

## 2013-07-23 NOTE — Telephone Encounter (Signed)
Spoke with Rhonda. She stated pt never came to their office. She was set up to see them but no showed her appointments. She stated pt was supposed to be seeing a Nephrologist that was in [place]. I tried to call pt to ask her about this and she did not pick up. She also no showed her lab work that we had set up for her at the end of November. To Dr. Turner to make aware  

## 2013-07-23 NOTE — Telephone Encounter (Signed)
Spoke with Bjorn Loser. She stated pt never came to their office. She was set up to see them but no showed her appointments. She stated pt was supposed to be seeing a Nephrologist that was in [place]. I tried to call pt to ask her about this and she did not pick up. She also no showed her lab work that we had set up for her at the end of November. To Dr. Mayford Knife to make aware

## 2013-07-24 ENCOUNTER — Ambulatory Visit (INDEPENDENT_AMBULATORY_CARE_PROVIDER_SITE_OTHER): Payer: Medicare Other | Admitting: Pharmacist

## 2013-07-24 DIAGNOSIS — I2699 Other pulmonary embolism without acute cor pulmonale: Secondary | ICD-10-CM

## 2013-07-24 LAB — POCT INR: INR: 1.8

## 2013-07-24 NOTE — Telephone Encounter (Signed)
Patient was on lipitor 80 mg qd and Zetia 10 mg qd for lipids. Dr. Mayford Knife tried to change patient to combination of Liptruzet for cost savings reasons, however insurance didn't cover Liptruzet. She is having a hard time affording meds. [facility] manages most things for her. Have her continue generic lipitor 80 mg daily and Zetia 10 mg daily. If she can't afford Zetia at this time that is okay to not take, just make sure she is able to continue generic lipitor 80 mg daily. Please notify patient.  LVM for pt and no return call.

## 2013-07-27 ENCOUNTER — Encounter: Payer: Self-pay | Admitting: General Surgery

## 2013-07-27 NOTE — Telephone Encounter (Signed)
Unable to reach pt. Left multiple VM for pt to return call.

## 2013-07-27 NOTE — Telephone Encounter (Signed)
Letter sent to pt to call us. To Dr. Mayford Knife to make aware.

## 2013-08-10 ENCOUNTER — Telehealth: Payer: Self-pay | Admitting: Cardiology

## 2013-08-10 NOTE — Telephone Encounter (Signed)
New Message  Pt called requesting to speak with Riki Rusk... Pt requests a call back.. Pt states that she has a low grade fever and she asks if Riki Rusk would still have her to come in for her coumadin appt. Offered to reschedule appointment. Pt requests to speak directly to Riki Rusk first.  Please assist

## 2013-08-10 NOTE — Telephone Encounter (Signed)
Spoke with pt.  Advised her to reschedule Coumadin appt due to fever.  Rescheduled until 12/26.

## 2013-08-10 NOTE — Telephone Encounter (Signed)
New Message  Pt received a letter in the mail in regards to medications and referral to [facility]. Request a call back to discuss.

## 2013-08-11 NOTE — Telephone Encounter (Signed)
Notes. Staff message reminder to call pt about appt in January

## 2013-08-11 NOTE — Telephone Encounter (Signed)
Spoke with pt and she has a nephrologist in [place] and will call them to be seen for her CKD. I asked that once she was set up to see this Dr To call me and let me know the name of the Dr and when she is set to see him so I can follow up with the pt after the visit. To Dr Mayford Knife to make aware.

## 2013-08-11 NOTE — Telephone Encounter (Signed)
Follow up     Calling to give [name] her appt date/time with the kidney doctor [place] [place] [facility]---thurs,  Jan 29th at 3:30

## 2013-08-14 ENCOUNTER — Ambulatory Visit (INDEPENDENT_AMBULATORY_CARE_PROVIDER_SITE_OTHER): Payer: Medicare Other | Admitting: Pharmacist

## 2013-08-14 DIAGNOSIS — I2699 Other pulmonary embolism without acute cor pulmonale: Secondary | ICD-10-CM

## 2013-08-14 LAB — POCT INR: INR: 2.2

## 2013-08-25 ENCOUNTER — Other Ambulatory Visit: Payer: Self-pay | Admitting: Cardiology

## 2013-09-02 NOTE — Telephone Encounter (Signed)
I Did not call pt. Maybe coumadin Clinic did?

## 2013-09-02 NOTE — Telephone Encounter (Signed)
Follow up: ° ° ° °Patient returning your call. °

## 2013-09-09 ENCOUNTER — Other Ambulatory Visit: Payer: Medicare Other

## 2013-09-18 ENCOUNTER — Telehealth: Payer: Self-pay | Admitting: *Deleted

## 2013-09-18 NOTE — Telephone Encounter (Signed)
We have 40 MG Twice a day. That is what she stated she was taking when we last saw her.

## 2013-09-18 NOTE — Telephone Encounter (Signed)
[facility] is requesting furosemide 40, take 2 tablets bid, but this is not what the chart has. Please advise which dose is correct. Thanks, MI

## 2013-09-21 ENCOUNTER — Other Ambulatory Visit: Payer: Self-pay | Admitting: *Deleted

## 2013-09-21 MED ORDER — FUROSEMIDE 40 MG PO TABS: 40.0000 mg | ORAL_TABLET | Freq: Two times a day (BID) | ORAL | Status: AC

## 2013-09-24 ENCOUNTER — Other Ambulatory Visit: Payer: Medicare Other

## 2013-09-25 ENCOUNTER — Other Ambulatory Visit: Payer: Medicare Other

## 2013-09-25 LAB — POCT INR: INR: 1.9

## 2013-09-28 ENCOUNTER — Ambulatory Visit (INDEPENDENT_AMBULATORY_CARE_PROVIDER_SITE_OTHER): Payer: Medicare Other | Admitting: Pharmacist

## 2013-09-28 DIAGNOSIS — I2699 Other pulmonary embolism without acute cor pulmonale: Secondary | ICD-10-CM

## 2013-09-30 ENCOUNTER — Telehealth: Payer: Self-pay | Admitting: Cardiology

## 2013-09-30 ENCOUNTER — Encounter: Payer: Self-pay | Admitting: Cardiology

## 2013-09-30 NOTE — Telephone Encounter (Signed)
To Danielle.  

## 2013-09-30 NOTE — Telephone Encounter (Signed)
Patient did not show for lipids/ALT and BMET - please reschedule

## 2013-09-30 NOTE — Progress Notes (Signed)
Patient did not show for BMET/ALT and lipids - please reschedule

## 2013-10-01 NOTE — Telephone Encounter (Signed)
LVM for pt to return call

## 2013-10-07 ENCOUNTER — Other Ambulatory Visit: Payer: Self-pay | Admitting: General Surgery

## 2013-10-07 DIAGNOSIS — E785 Hyperlipidemia, unspecified: Secondary | ICD-10-CM

## 2013-10-07 DIAGNOSIS — I10 Essential (primary) hypertension: Secondary | ICD-10-CM

## 2013-10-07 NOTE — Telephone Encounter (Signed)
Pt made aware and r/s lab appt/

## 2013-10-07 NOTE — Telephone Encounter (Signed)
LVM for pt to return call

## 2013-10-12 ENCOUNTER — Encounter: Payer: Medicare Other | Admitting: *Deleted

## 2013-10-13 ENCOUNTER — Other Ambulatory Visit: Payer: Medicare Other

## 2013-10-23 ENCOUNTER — Other Ambulatory Visit: Payer: Self-pay | Admitting: *Deleted

## 2013-10-23 MED ORDER — WARFARIN SODIUM 7.5 MG PO TABS: 7.5000 mg | ORAL_TABLET | ORAL | Status: DC

## 2013-10-26 ENCOUNTER — Ambulatory Visit (INDEPENDENT_AMBULATORY_CARE_PROVIDER_SITE_OTHER): Payer: Commercial Managed Care - HMO

## 2013-10-26 DIAGNOSIS — I2699 Other pulmonary embolism without acute cor pulmonale: Secondary | ICD-10-CM

## 2013-10-26 LAB — POCT INR: INR: 3.7

## 2013-10-27 ENCOUNTER — Encounter: Payer: Self-pay | Admitting: *Deleted

## 2013-11-12 ENCOUNTER — Telehealth: Payer: Self-pay | Admitting: Internal Medicine

## 2013-11-12 ENCOUNTER — Encounter: Payer: Self-pay | Admitting: Internal Medicine

## 2013-11-12 NOTE — Telephone Encounter (Signed)
11-12-13 lmm @ 1255pm for pt to send defib remote transmission, shakila also sent letter 3-10-15mt

## 2013-11-25 ENCOUNTER — Other Ambulatory Visit: Payer: Self-pay

## 2013-11-25 MED ORDER — CARVEDILOL 3.125 MG PO TABS: ORAL_TABLET | ORAL | Status: DC

## 2013-11-27 ENCOUNTER — Ambulatory Visit (INDEPENDENT_AMBULATORY_CARE_PROVIDER_SITE_OTHER): Payer: Commercial Managed Care - HMO | Admitting: Pharmacist

## 2013-11-27 DIAGNOSIS — I2699 Other pulmonary embolism without acute cor pulmonale: Secondary | ICD-10-CM

## 2013-11-27 LAB — POCT INR: INR: 3.4

## 2013-12-11 ENCOUNTER — Other Ambulatory Visit: Payer: Self-pay

## 2013-12-11 ENCOUNTER — Ambulatory Visit (INDEPENDENT_AMBULATORY_CARE_PROVIDER_SITE_OTHER): Payer: Commercial Managed Care - HMO | Admitting: *Deleted

## 2013-12-11 DIAGNOSIS — I2699 Other pulmonary embolism without acute cor pulmonale: Secondary | ICD-10-CM

## 2013-12-11 LAB — POCT INR: INR: 2.3

## 2013-12-11 MED ORDER — ISOSORBIDE DINITRATE 30 MG PO TABS: 30.0000 mg | ORAL_TABLET | Freq: Every day | ORAL | Status: DC

## 2013-12-22 ENCOUNTER — Other Ambulatory Visit: Payer: Self-pay | Admitting: *Deleted

## 2013-12-22 ENCOUNTER — Telehealth: Payer: Self-pay

## 2013-12-22 MED ORDER — DILTIAZEM HCL ER BEADS 240 MG PO CP24: 240.0000 mg | ORAL_CAPSULE | Freq: Every evening | ORAL | Status: DC

## 2013-12-22 NOTE — Telephone Encounter (Signed)
Isosorbide mono 60 MG 1/2 tablet daily

## 2013-12-23 ENCOUNTER — Other Ambulatory Visit: Payer: Self-pay

## 2013-12-23 MED ORDER — ISOSORBIDE DINITRATE 30 MG PO TABS: ORAL_TABLET | ORAL | Status: DC

## 2013-12-29 ENCOUNTER — Other Ambulatory Visit: Payer: Self-pay | Admitting: Family Medicine

## 2013-12-29 ENCOUNTER — Ambulatory Visit
Admission: RE | Admit: 2013-12-29 | Discharge: 2013-12-29 | Disposition: A | Discharge status: Home / Self Care | Payer: Commercial Managed Care - HMO | Source: Ambulatory Visit | Attending: Family Medicine | Admitting: Family Medicine

## 2013-12-29 DIAGNOSIS — R109 Unspecified abdominal pain: Secondary | ICD-10-CM

## 2014-01-08 ENCOUNTER — Other Ambulatory Visit: Payer: Self-pay | Admitting: Physician Assistant

## 2014-01-08 ENCOUNTER — Ambulatory Visit
Admission: RE | Admit: 2014-01-08 | Discharge: 2014-01-08 | Disposition: A | Discharge status: Home / Self Care | Payer: Commercial Managed Care - HMO | Source: Ambulatory Visit | Attending: Physician Assistant | Admitting: Physician Assistant

## 2014-01-08 DIAGNOSIS — Z09 Encounter for follow-up examination after completed treatment for conditions other than malignant neoplasm: Secondary | ICD-10-CM

## 2014-01-19 ENCOUNTER — Other Ambulatory Visit: Payer: Self-pay

## 2014-01-19 ENCOUNTER — Other Ambulatory Visit: Payer: Self-pay | Admitting: Family Medicine

## 2014-01-19 DIAGNOSIS — R1011 Right upper quadrant pain: Secondary | ICD-10-CM

## 2014-01-19 MED ORDER — ATORVASTATIN CALCIUM 80 MG PO TABS: 80.0000 mg | ORAL_TABLET | Freq: Every day | ORAL | Status: DC

## 2014-01-20 ENCOUNTER — Other Ambulatory Visit: Payer: Commercial Managed Care - HMO

## 2014-01-20 ENCOUNTER — Encounter: Payer: Self-pay | Admitting: Cardiology

## 2014-01-25 ENCOUNTER — Ambulatory Visit
Admission: RE | Admit: 2014-01-25 | Discharge: 2014-01-25 | Disposition: A | Discharge status: Home / Self Care | Payer: Commercial Managed Care - HMO | Source: Ambulatory Visit | Attending: Family Medicine | Admitting: Family Medicine

## 2014-01-25 ENCOUNTER — Encounter (INDEPENDENT_AMBULATORY_CARE_PROVIDER_SITE_OTHER): Payer: Self-pay

## 2014-01-25 DIAGNOSIS — R1011 Right upper quadrant pain: Secondary | ICD-10-CM

## 2014-03-04 ENCOUNTER — Other Ambulatory Visit: Payer: Self-pay | Admitting: Family Medicine

## 2014-03-04 ENCOUNTER — Ambulatory Visit
Admission: RE | Admit: 2014-03-04 | Discharge: 2014-03-04 | Disposition: A | Discharge status: Home / Self Care | Payer: Commercial Managed Care - HMO | Source: Ambulatory Visit | Attending: Family Medicine | Admitting: Family Medicine

## 2014-03-04 DIAGNOSIS — R05 Cough: Secondary | ICD-10-CM

## 2014-03-04 DIAGNOSIS — R609 Edema, unspecified: Secondary | ICD-10-CM

## 2014-03-04 DIAGNOSIS — R52 Pain, unspecified: Secondary | ICD-10-CM

## 2014-03-04 DIAGNOSIS — T148XXA Other injury of unspecified body region, initial encounter: Secondary | ICD-10-CM

## 2014-03-04 DIAGNOSIS — R059 Cough, unspecified: Secondary | ICD-10-CM

## 2014-04-05 ENCOUNTER — Telehealth: Payer: Self-pay | Admitting: Cardiology

## 2014-04-05 NOTE — Telephone Encounter (Signed)
TO Dr Turner to advise.  

## 2014-04-05 NOTE — Telephone Encounter (Signed)
New message      Pt had ankle/feet edema.  She took an extra lasix and it went away.  This has never happened and she wanted to see what Dr Mayford Knife thought.  Please call

## 2014-04-06 NOTE — Telephone Encounter (Signed)
lmtrc

## 2014-04-06 NOTE — Telephone Encounter (Signed)
It may have been that she had eaten a higher sodium load than usual. Call if it happens again and follow a 2gm sodium diet

## 2014-04-06 NOTE — Telephone Encounter (Signed)
Pt is aware.  

## 2014-04-13 ENCOUNTER — Other Ambulatory Visit: Payer: Self-pay

## 2014-04-13 MED ORDER — HYDRALAZINE HCL 10 MG PO TABS: 10.0000 mg | ORAL_TABLET | Freq: Four times a day (QID) | ORAL | Status: AC

## 2014-05-20 ENCOUNTER — Other Ambulatory Visit: Payer: Self-pay | Admitting: *Deleted

## 2014-05-20 MED ORDER — ATORVASTATIN CALCIUM 80 MG PO TABS: 80.0000 mg | ORAL_TABLET | Freq: Every day | ORAL | Status: DC

## 2014-05-28 ENCOUNTER — Encounter: Payer: Self-pay | Admitting: Cardiology

## 2014-06-25 ENCOUNTER — Encounter: Payer: Self-pay | Admitting: Internal Medicine

## 2014-06-25 ENCOUNTER — Other Ambulatory Visit: Payer: Self-pay

## 2014-06-25 ENCOUNTER — Other Ambulatory Visit: Payer: Self-pay | Admitting: Cardiology

## 2014-06-25 MED ORDER — ATORVASTATIN CALCIUM 80 MG PO TABS: 80.0000 mg | ORAL_TABLET | Freq: Every day | ORAL | Status: DC

## 2014-06-25 MED ORDER — CARVEDILOL 3.125 MG PO TABS: ORAL_TABLET | ORAL | Status: AC

## 2014-07-21 ENCOUNTER — Other Ambulatory Visit: Payer: Self-pay

## 2014-07-21 MED ORDER — ATORVASTATIN CALCIUM 80 MG PO TABS: 80.0000 mg | ORAL_TABLET | Freq: Every day | ORAL | Status: DC

## 2014-07-29 ENCOUNTER — Encounter (HOSPITAL_COMMUNITY): Payer: Self-pay | Admitting: Internal Medicine

## 2014-08-04 ENCOUNTER — Ambulatory Visit: Payer: Commercial Managed Care - HMO | Admitting: Cardiology

## 2014-08-26 DIAGNOSIS — Z7901 Long term (current) use of anticoagulants: Secondary | ICD-10-CM | POA: Diagnosis not present

## 2014-08-26 DIAGNOSIS — I4891 Unspecified atrial fibrillation: Secondary | ICD-10-CM | POA: Diagnosis not present

## 2014-09-02 DIAGNOSIS — I2510 Atherosclerotic heart disease of native coronary artery without angina pectoris: Secondary | ICD-10-CM | POA: Diagnosis not present

## 2014-09-02 DIAGNOSIS — N183 Chronic kidney disease, stage 3 (moderate): Secondary | ICD-10-CM | POA: Diagnosis not present

## 2014-09-02 DIAGNOSIS — E039 Hypothyroidism, unspecified: Secondary | ICD-10-CM | POA: Diagnosis not present

## 2014-09-02 DIAGNOSIS — M797 Fibromyalgia: Secondary | ICD-10-CM | POA: Diagnosis not present

## 2014-09-02 DIAGNOSIS — I10 Essential (primary) hypertension: Secondary | ICD-10-CM | POA: Diagnosis not present

## 2014-09-02 DIAGNOSIS — E1122 Type 2 diabetes mellitus with diabetic chronic kidney disease: Secondary | ICD-10-CM | POA: Diagnosis not present

## 2014-09-02 DIAGNOSIS — I4891 Unspecified atrial fibrillation: Secondary | ICD-10-CM | POA: Diagnosis not present

## 2014-09-02 DIAGNOSIS — F329 Major depressive disorder, single episode, unspecified: Secondary | ICD-10-CM | POA: Diagnosis not present

## 2014-09-04 ENCOUNTER — Other Ambulatory Visit: Payer: Self-pay | Admitting: Cardiology

## 2014-09-06 DIAGNOSIS — I4891 Unspecified atrial fibrillation: Secondary | ICD-10-CM | POA: Diagnosis not present

## 2014-09-06 DIAGNOSIS — Z7901 Long term (current) use of anticoagulants: Secondary | ICD-10-CM | POA: Diagnosis not present

## 2014-09-14 ENCOUNTER — Ambulatory Visit: Payer: Commercial Managed Care - HMO | Admitting: Cardiology

## 2014-09-26 NOTE — Progress Notes (Signed)
This encounter was created in error - please disregard.

## 2014-09-27 ENCOUNTER — Encounter: Payer: Commercial Managed Care - HMO | Admitting: Cardiology

## 2014-10-12 DIAGNOSIS — R300 Dysuria: Secondary | ICD-10-CM | POA: Diagnosis not present

## 2014-10-14 DIAGNOSIS — N390 Urinary tract infection, site not specified: Secondary | ICD-10-CM | POA: Diagnosis not present

## 2014-10-19 ENCOUNTER — Ambulatory Visit: Payer: Self-pay | Admitting: Internal Medicine

## 2014-10-28 ENCOUNTER — Encounter: Payer: Commercial Managed Care - HMO | Admitting: Cardiology

## 2014-10-28 NOTE — Progress Notes (Signed)
This encounter was created in error - please disregard.

## 2014-10-29 ENCOUNTER — Encounter: Payer: Self-pay | Admitting: Internal Medicine

## 2014-10-29 ENCOUNTER — Encounter: Payer: Self-pay | Admitting: Cardiology

## 2014-10-29 ENCOUNTER — Ambulatory Visit (INDEPENDENT_AMBULATORY_CARE_PROVIDER_SITE_OTHER): Payer: Commercial Managed Care - HMO | Admitting: Cardiology

## 2014-10-29 ENCOUNTER — Ambulatory Visit (INDEPENDENT_AMBULATORY_CARE_PROVIDER_SITE_OTHER): Payer: Commercial Managed Care - HMO | Admitting: *Deleted

## 2014-10-29 ENCOUNTER — Other Ambulatory Visit: Payer: Self-pay

## 2014-10-29 VITALS — BP 142/78 | HR 93 | Ht 66.0 in | Wt 226.6 lb

## 2014-10-29 DIAGNOSIS — I519 Heart disease, unspecified: Secondary | ICD-10-CM | POA: Diagnosis not present

## 2014-10-29 DIAGNOSIS — I2510 Atherosclerotic heart disease of native coronary artery without angina pectoris: Secondary | ICD-10-CM

## 2014-10-29 DIAGNOSIS — E785 Hyperlipidemia, unspecified: Secondary | ICD-10-CM

## 2014-10-29 DIAGNOSIS — I2583 Coronary atherosclerosis due to lipid rich plaque: Principal | ICD-10-CM

## 2014-10-29 DIAGNOSIS — I10 Essential (primary) hypertension: Secondary | ICD-10-CM | POA: Diagnosis not present

## 2014-10-29 DIAGNOSIS — I480 Paroxysmal atrial fibrillation: Secondary | ICD-10-CM | POA: Diagnosis not present

## 2014-10-29 DIAGNOSIS — I255 Ischemic cardiomyopathy: Secondary | ICD-10-CM | POA: Diagnosis not present

## 2014-10-29 DIAGNOSIS — I420 Dilated cardiomyopathy: Secondary | ICD-10-CM

## 2014-10-29 LAB — MDC_IDC_ENUM_SESS_TYPE_INCLINIC
Battery Voltage: 2.99 V
Brady Statistic AP VP Percent: 0.04 %
Brady Statistic AP VS Percent: 0.01 %
Brady Statistic AS VP Percent: 98.84 %
Brady Statistic RA Percent Paced: 0.05 %
Brady Statistic RV Percent Paced: 98.27 %
Date Time Interrogation Session: 20160311171305
HIGH POWER IMPEDANCE MEASURED VALUE: 42 Ohm
HIGH POWER IMPEDANCE MEASURED VALUE: 51 Ohm
HighPow Impedance: 171 Ohm
Lead Channel Impedance Value: 266 Ohm
Lead Channel Impedance Value: 494 Ohm
Lead Channel Pacing Threshold Amplitude: 0.625 V
Lead Channel Pacing Threshold Amplitude: 0.75 V
Lead Channel Pacing Threshold Amplitude: 1.25 V
Lead Channel Pacing Threshold Pulse Width: 0.4 ms
Lead Channel Pacing Threshold Pulse Width: 0.4 ms
Lead Channel Sensing Intrinsic Amplitude: 19 mV
Lead Channel Sensing Intrinsic Amplitude: 2.625 mV
Lead Channel Setting Pacing Amplitude: 2 V
Lead Channel Setting Pacing Amplitude: 2.25 V
Lead Channel Setting Pacing Amplitude: 2.5 V
Lead Channel Setting Pacing Pulse Width: 0.4 ms
MDC IDC MSMT BATTERY REMAINING LONGEVITY: 82 mo
MDC IDC MSMT LEADCHNL LV IMPEDANCE VALUE: 646 Ohm
MDC IDC MSMT LEADCHNL LV PACING THRESHOLD PULSEWIDTH: 0.4 ms
MDC IDC MSMT LEADCHNL RA IMPEDANCE VALUE: 399 Ohm
MDC IDC MSMT LEADCHNL RA SENSING INTR AMPL: 2.25 mV
MDC IDC MSMT LEADCHNL RV IMPEDANCE VALUE: 703 Ohm
MDC IDC MSMT LEADCHNL RV SENSING INTR AMPL: 19.25 mV
MDC IDC SET LEADCHNL LV PACING PULSEWIDTH: 0.4 ms
MDC IDC SET LEADCHNL RV SENSING SENSITIVITY: 0.3 mV
MDC IDC STAT BRADY AS VS PERCENT: 1.11 %
Zone Setting Detection Interval: 290 ms
Zone Setting Detection Interval: 320 ms
Zone Setting Detection Interval: 400 ms
Zone Setting Detection Interval: 450 ms

## 2014-10-29 MED ORDER — DILTIAZEM HCL ER BEADS 240 MG PO CP24: 240.0000 mg | ORAL_CAPSULE | Freq: Every evening | ORAL | Status: AC

## 2014-10-29 NOTE — Patient Instructions (Signed)
Your physician recommends that you continue on your current medications as directed. Please refer to the Current Medication list given to you today.   Your physician wants you to follow-up in: 1 year with Dr. Mayford Knife. You will receive a reminder letter in the mail two months in advance. If you don't receive a letter, please call our office to schedule the follow-up appointment @ [phone]     Your physician has requested that you have a lexiscan myoview.  DX: Chest Pain For further information please visit [email or web address] Please follow instruction sheet, as given.

## 2014-10-29 NOTE — Progress Notes (Signed)
Cardiology Office Note   Date:  10/29/2014   ID:  [name], DOB [birth year], MRN [id number]  PCP:  Delphia Grates  Cardiologist:   Quintella Reichert, MD   Chief Complaint  Patient presents with  . Coronary Artery Disease  . Hypertension  . Hyperlipidemia      History of Present Illness: [name] is a 69 y.o. female with a history of ASCAD, HTN, dyslipidemia, ischemic DCM s/p BiVAICD EF 49% now, chronic anticoagulation for afib, LBBB and chronic systolic CHF who presents today for followup. She is doing well. She denies any LE edema, palpitations or syncope. She has chronic SOB and DOE which are stable. She did have 3 episodes of chest discomfort that only lasted 90 seconds and resolved.  It was similar to what she had with her angina in the past.      Past Medical History  Diagnosis Date  . Asthma   . Depression   . Sjogren's syndrome   . Lupus   . Chronic systolic dysfunction of left ventricle   . Chronic renal insufficiency   . Diabetes mellitus   . Hypertension   . Hyperlipidemia   . Pulmonary embolism     previously on coumadin  . Chronic anticoagulation   . Obesity   . Ischemic dilated cardiomyopathy     s/p BiV AICD - EF now 49%  . Fibromyalgia   . Coronary artery disease 2002    s/p PCI of RCA  . Psoriasis   . RA (rheumatoid arthritis)   . LBBB (left bundle branch block)   . Atrial fibrillation   . Stroke     TIA    Past Surgical History  Procedure Laterality Date  . Mastectomy  1982  . Angioplasty  2002  . Hysterectomy (other)    . Biventricular defibrillator implantation  08/12/07    MDT Concerto implanted by Dr Amil Amen; generator change 03-18-2013 by [name]  . Biv pacemaker generator change out  03/18/2013    MDT Viva XR BIV ICD generator change by [name]  . Implantable cardioverter defibrillator generator change N/A 03/18/2013    Procedure: IMPLANTABLE CARDIOVERTER DEFIBRILLATOR GENERATOR CHANGE;  Surgeon: Hillis Range, MD;   Location: [facility] CATH LAB;  Service: Cardiovascular;  Laterality: N/A;     Current Outpatient Prescriptions  Medication Sig Dispense Refill  . atorvastatin (LIPITOR) 80 MG tablet TAKE 1 TABLET EVERY DAY 30 tablet 0  . carisoprodol (SOMA) 350 MG tablet Take 350 mg by mouth daily as needed for muscle spasms (pain).     . carvedilol (COREG) 3.125 MG tablet TAKE 1 TABLET BY MOUTH TWICE DAILY 180 tablet 0  . Cholecalciferol (VITAMIN D) 2000 UNITS tablet Take 2,000 Units by mouth every evening.    . diclofenac sodium (VOLTAREN) 1 % GEL Apply 1 application topically daily as needed (pain).    Marland Kitchen diltiazem (TIAZAC) 240 MG 24 hr capsule Take 1 capsule (240 mg total) by mouth every evening. 30 capsule 0  . diphenhydrAMINE (BENADRYL) 25 MG tablet Take 25 mg by mouth daily as needed for allergies.    . DULoxetine (CYMBALTA) 60 MG capsule Take 60 mg by mouth daily. Take 2 tablets by mouth daily    . ezetimibe (ZETIA) 10 MG tablet Take 10 mg by mouth daily.    . furosemide (LASIX) 40 MG tablet Take 1 tablet (40 mg total) by mouth 2 (two) times daily. 60 tablet 3  . hydrALAZINE (APRESOLINE) 10 MG tablet Take  1 tablet (10 mg total) by mouth 4 (four) times daily. 120 tablet 0  . isosorbide dinitrate (ISORDIL) 30 MG tablet Take 1/2 tab daily 30 tablet 3  . LORazepam (ATIVAN) 1 MG tablet Take 1 mg by mouth 3 (three) times daily as needed for anxiety (sleep).     Marland Kitchen oxyCODONE-acetaminophen (PERCOCET/ROXICET) 5-325 MG per tablet Take 1 tablet by mouth every 6 (six) hours as needed.    . ranitidine (ZANTAC) 300 MG capsule Take 300 mg by mouth 2 (two) times daily.    Marland Kitchen rOPINIRole (REQUIP) 1 MG tablet Take 1 mg by mouth at bedtime as needed (sleep/restless legs).     Marland Kitchen spironolactone (ALDACTONE) 25 MG tablet Take 25 mg by mouth daily.    Marland Kitchen SYNTHROID 175 MCG tablet Take 175 mg by mouth daily.    Marland Kitchen tiZANidine (ZANAFLEX) 4 MG tablet Take 4 mg by mouth every 8 (eight) hours as needed. For muscle spasms  5  . TRADJENTA 5 MG  TABS tablet Take 5 mg by mouth daily.    Carlena Hurl 20 MG TABS tablet Take 20 mg by mouth daily.     No current facility-administered medications for this visit.    Allergies:   Contrast media; Latex; Penicillins; and Sulfonamide derivatives    Social History:  The patient  reports that she has quit smoking. She does not have any smokeless tobacco history on file. She reports that she does not drink alcohol or use illicit drugs.   Family History:  The patient's family history includes Heart attack in her father; Heart disease in her father.    ROS:  Please see the history of present illness.   Otherwise, review of systems are positive for joint swelling, back pain, muscle pain, leg pain and fatigue.   All other systems are reviewed and negative.    PHYSICAL EXAM: VS:  BP 142/78 mmHg  Pulse 93  Ht 5\' 6"  (1.676 m)  Wt 226 lb 9.6 oz (102.785 kg)  BMI 36.59 kg/m2 , BMI Body mass index is 36.59 kg/(m^2). GEN: Well nourished, well developed, in no acute distress HEENT: normal Neck: no JVD, carotid bruits, or masses Cardiac: RRR; no murmurs, rubs, or gallops,no edema  Respiratory:  clear to auscultation bilaterally, normal work of breathing GI: soft, nontender, nondistended, + BS MS: no deformity or atrophy Skin: warm and dry, no rash Neuro:  Strength and sensation are intact Psych: euthymic mood, full affect   EKG:  EKG is ordered today. The ekg ordered today demonstrates NSR with V paced rhythm   Recent Labs: No results found for requested labs within last 365 days.    Lipid Panel    Component Value Date/Time   CHOL  08/20/2010 0534    136        ATP III CLASSIFICATION:  <200     mg/dL   Desirable  10/19/2010  mg/dL   Borderline High  025-852    mg/dL   High          TRIG 82 08/20/2010 0534   HDL 40 08/20/2010 0534   CHOLHDL 3.4 08/20/2010 0534   VLDL 16 08/20/2010 0534   LDLCALC  08/20/2010 0534    80        Total Cholesterol/HDL:CHD Risk Coronary Heart Disease Risk  Table                     Men   Women  1/2 Average Risk   3.4  3.3  Average Risk       5.0   4.4  2 X Average Risk   9.6   7.1  3 X Average Risk  23.4   11.0        Use the calculated Patient Ratio above and the CHD Risk Table to determine the patient's CHD Risk.        ATP III CLASSIFICATION (LDL):  <100     mg/dL   Optimal  992-426  mg/dL   Near or Above                    Optimal  130-159  mg/dL   Borderline  834-196  mg/dL   High  >222     mg/dL   Very High      Wt Readings from Last 3 Encounters:  10/29/14 226 lb 9.6 oz (102.785 kg)  07/15/13 216 lb 6.4 oz (98.158 kg)  07/09/13 215 lb 12.8 oz (97.886 kg)       ASSESSMENT AND PLAN:  1. ASCAD with several episodes of brief CP.  She has not had an ischemic in 7 years.    - Lexiscan myoview - continue Imdur.  She is not on ASA due to Xarelto 2. HTN - controlled - continue Coreg/Diltiazem/Hydralazine 3. Dyslipidemia - I will get a copy of her lipids from her PCP.  Continue Zetia/Lipitor 4.       Chronic systolic CHF - appears compensated - continue coreg/Lasix/aldactone/hydralazine 5. Ischemic DCM s/p BiVAICD 6. PAF maintaining NSR - Continue Coreg/diltiazem/Xarelto   Current medicines are reviewed at length with the patient today.  The patient does not have concerns regarding medicines.  The following changes have been made:  no change  Labs/ tests ordered today include: Nuclear stress test  Orders Placed This Encounter  Procedures  . Myocardial Perfusion Imaging  . EKG 12-Lead     Disposition:   FU with me in 1 year   Signed, Quintella Reichert, MD  10/29/2014 11:20 AM    [facility] [address] [place], [place], [place]  [address] Phone: [phone]; Fax: [phone]

## 2014-11-01 ENCOUNTER — Encounter (HOSPITAL_COMMUNITY): Payer: Commercial Managed Care - HMO

## 2014-11-03 ENCOUNTER — Other Ambulatory Visit: Payer: Self-pay

## 2014-11-03 MED ORDER — ISOSORBIDE DINITRATE 30 MG PO TABS: ORAL_TABLET | ORAL | Status: DC

## 2014-11-04 DIAGNOSIS — M5136 Other intervertebral disc degeneration, lumbar region: Secondary | ICD-10-CM | POA: Diagnosis not present

## 2014-11-04 DIAGNOSIS — M545 Low back pain: Secondary | ICD-10-CM | POA: Diagnosis not present

## 2014-11-04 DIAGNOSIS — M542 Cervicalgia: Secondary | ICD-10-CM | POA: Diagnosis not present

## 2014-11-04 DIAGNOSIS — M5032 Other cervical disc degeneration, mid-cervical region: Secondary | ICD-10-CM | POA: Diagnosis not present

## 2014-11-09 ENCOUNTER — Encounter (HOSPITAL_COMMUNITY): Payer: Commercial Managed Care - HMO

## 2014-11-11 ENCOUNTER — Ambulatory Visit (HOSPITAL_COMMUNITY): Payer: Commercial Managed Care - HMO | Attending: Cardiology | Admitting: Radiology

## 2014-11-11 DIAGNOSIS — I447 Left bundle-branch block, unspecified: Secondary | ICD-10-CM | POA: Diagnosis not present

## 2014-11-11 DIAGNOSIS — R0789 Other chest pain: Secondary | ICD-10-CM

## 2014-11-11 DIAGNOSIS — I2510 Atherosclerotic heart disease of native coronary artery without angina pectoris: Secondary | ICD-10-CM | POA: Insufficient documentation

## 2014-11-11 DIAGNOSIS — E785 Hyperlipidemia, unspecified: Secondary | ICD-10-CM | POA: Insufficient documentation

## 2014-11-11 DIAGNOSIS — I480 Paroxysmal atrial fibrillation: Secondary | ICD-10-CM

## 2014-11-11 DIAGNOSIS — I4891 Unspecified atrial fibrillation: Secondary | ICD-10-CM | POA: Diagnosis not present

## 2014-11-11 DIAGNOSIS — I420 Dilated cardiomyopathy: Secondary | ICD-10-CM

## 2014-11-11 DIAGNOSIS — E119 Type 2 diabetes mellitus without complications: Secondary | ICD-10-CM | POA: Insufficient documentation

## 2014-11-11 DIAGNOSIS — R0609 Other forms of dyspnea: Secondary | ICD-10-CM | POA: Insufficient documentation

## 2014-11-11 DIAGNOSIS — R079 Chest pain, unspecified: Secondary | ICD-10-CM | POA: Diagnosis not present

## 2014-11-11 DIAGNOSIS — I2583 Coronary atherosclerosis due to lipid rich plaque: Secondary | ICD-10-CM

## 2014-11-11 DIAGNOSIS — I10 Essential (primary) hypertension: Secondary | ICD-10-CM | POA: Insufficient documentation

## 2014-11-11 DIAGNOSIS — I255 Ischemic cardiomyopathy: Secondary | ICD-10-CM

## 2014-11-11 DIAGNOSIS — I519 Heart disease, unspecified: Secondary | ICD-10-CM

## 2014-11-11 MED ORDER — REGADENOSON 0.4 MG/5ML IV SOLN
0.4000 mg | Freq: Once | INTRAVENOUS | Status: AC
  Administered 2014-11-11: 0.4 mg via INTRAVENOUS

## 2014-11-11 MED ORDER — TECHNETIUM TC 99M SESTAMIBI GENERIC - CARDIOLITE
30.0000 | Freq: Once | INTRAVENOUS | Status: AC | PRN
  Administered 2014-11-11: 30 via INTRAVENOUS

## 2014-11-11 MED ORDER — TECHNETIUM TC 99M SESTAMIBI GENERIC - CARDIOLITE
10.0000 | Freq: Once | INTRAVENOUS | Status: AC | PRN
  Administered 2014-11-11: 10 via INTRAVENOUS

## 2014-11-11 NOTE — Progress Notes (Signed)
[facility] SITE 3 NUCLEAR MED [address] [place], [place] [address] [phone]    Cardiology Nuclear Med Study  [name] is a 69 y.o. female     MRN : [id number]     DOB: 08-12-1946  Procedure Date: 11/11/2014  Nuclear Med Background Indication for Stress Test:  Evaluation for Ischemia History:  CAD, MPI 2008 (normal) EF 33%, AICD Cardiac Risk Factors: Carotid Disease, Hypertension, LBBB, Lipids, NIDDM and Atrial Fib  Symptoms:  Chest Pain (last date of chest discomfort was one month ago) and DOE   Nuclear Pre-Procedure Caffeine/Decaff Intake:  8:00pm NPO After: 11:00pm   Lungs:  clear O2 Sat: 97% on room air. IV 0.9% NS with Angio Cath:  22g  IV Site: R Forearm  IV Started by:  Diamond Parsons, RN  Chest Size (in):  42 Cup Size: B  Height: 5\' 6"  (1.676 m)  Weight:  221 lb (100.245 kg)  BMI:  Body mass index is 35.69 kg/(m^2). Tech Comments:  Coreg taken at 1000 today    Nuclear Med Study 1 or 2 day study: 1 day  Stress Test Type:  Lexiscan  Reading MD: n/a  Order Authorizing Provider:  Turner,MD  Resting Radionuclide: Technetium 12m Sestamibi  Resting Radionuclide Dose: 11.0 mCi   Stress Radionuclide:  Technetium 8m Sestamibi  Stress Radionuclide Dose: 33.0 mCi           Stress Protocol Rest HR: 74 Stress HR: 92  Rest BP: 138/70 Stress BP: 145/65  Exercise Time (min): n/a METS: n/a   Predicted Max HR: 152 bpm % Max HR: 60.53 bpm Rate Pressure Product: 84m   Dose of Adenosine (mg):  n/a Dose of Lexiscan: 0.4 mg  Dose of Atropine (mg): n/a Dose of Dobutamine: n/a mcg/kg/min (at max HR)  Stress Test Technologist: [id number], BS-ES  Nuclear Technologist:  [name], CNMT     Rest Procedure:  Myocardial perfusion imaging was performed at rest 45 minutes following the intravenous administration of Technetium 60m Sestamibi. Rest ECG: v-paced rhythm  Stress Procedure:  The patient received IV Lexiscan 0.4 mg over 15-seconds.   Technetium 76m Sestamibi injected at 30-seconds.  Quantitative spect images were obtained after a 45 minute delay.  During the infusion of Lexiscan the patient complained of chest heaviness, stomach upset and fatigue.  These symptoms began to resolve in recovery.  Stress ECG: No significant change from baseline ECG  QPS Raw Data Images:  Normal; no motion artifact; normal heart/lung ratio. Stress Images:  Large, severe perfusion defect involving the entire inferior and inferolateral walls and the basal inferoseptal segment.  Rest Images:  Large, severe perfusion defect involving the entire inferior and inferolateral walls and the basal inferoseptal segment. Subtraction (SDS):  Fixed, large, severe perfusion defect involving the entire inferior and inferolateral walls and the basal inferoseptal segment. Transient Ischemic Dilatation (Normal <1.22):  0.97 Lung/Heart Ratio (Normal <0.45):  0.34  Quantitative Gated Spect Images QGS EDV:  129 ml QGS ESV:  66 ml  Impression Exercise Capacity:  Lexiscan with no exercise. BP Response:  Normal blood pressure response. Clinical Symptoms:  Chest heaviness ECG Impression:  V-paced rhythm Comparison with Prior Nuclear Study: No images to compare  Overall Impression:  Intermediate risk stress nuclear study with a fixed large, severe perfusion defect involving the entire inferior and inferolateral walls and the basal inferoseptal segment.  There is no significant ischemia.  Intermediate risk driven by low EF.   LV Ejection Fraction: 49%.  LV Wall Motion:  Lateral hypokinesis.    [name] 11/11/2014

## 2014-11-17 NOTE — Progress Notes (Signed)
CRT-D device check in office. Thresholds and sensing consistent with previous device measurements. Lead impedance trends stable over time. No mode switch episodes recorded. 3 nst episodes. Patient bi-ventricularly pacing 98.3% of the time. Device programmed with appropriate safety margins. Optivol increase since middle of February and ongoing.  No changes made this session. Estimated longevity 6.9 years.  Patient enrolled in remote follow up. ROV in 3 mths w/JA. Pt seeing TT.

## 2014-11-23 ENCOUNTER — Telehealth: Payer: Self-pay | Admitting: Cardiology

## 2014-11-23 NOTE — Telephone Encounter (Signed)
Pt enrolled in ICM clinic. 1st transmission 12-23-14 

## 2014-11-23 NOTE — Telephone Encounter (Signed)
-----   Message from Hillis Range, MD sent at 11/22/2014 10:49 PM EDT ----- Please enroll in [facility] clinic with Heather and schedule

## 2014-12-15 ENCOUNTER — Other Ambulatory Visit: Payer: Self-pay

## 2014-12-15 MED ORDER — ATORVASTATIN CALCIUM 80 MG PO TABS: 80.0000 mg | ORAL_TABLET | Freq: Every day | ORAL | Status: AC

## 2014-12-17 ENCOUNTER — Telehealth: Payer: Self-pay | Admitting: Cardiology

## 2014-12-17 NOTE — Telephone Encounter (Signed)
Informed patient that Dr. Mayford Knife is not in the office today, but I will call the patient on Monday with Dr. Norris Cross recommendations.

## 2014-12-17 NOTE — Telephone Encounter (Signed)
New Message      Pt calling wanting to find out from Dr. Mayford Knife if she can take BiphedAdrene (dietary supplement). Please call back and advise.

## 2014-12-18 NOTE — Telephone Encounter (Signed)
Needs to avoid diet pills

## 2014-12-20 NOTE — Telephone Encounter (Signed)
Per Dr. Mayford Knife, instructed patient to avoid diet pills. Patient agrees with treatment plan and is grateful for callback.

## 2014-12-23 ENCOUNTER — Encounter: Payer: Commercial Managed Care - HMO | Admitting: *Deleted

## 2014-12-29 DIAGNOSIS — I10 Essential (primary) hypertension: Secondary | ICD-10-CM | POA: Diagnosis not present

## 2014-12-29 DIAGNOSIS — N183 Chronic kidney disease, stage 3 (moderate): Secondary | ICD-10-CM | POA: Diagnosis not present

## 2014-12-29 DIAGNOSIS — E039 Hypothyroidism, unspecified: Secondary | ICD-10-CM | POA: Diagnosis not present

## 2014-12-29 DIAGNOSIS — E119 Type 2 diabetes mellitus without complications: Secondary | ICD-10-CM | POA: Diagnosis not present

## 2014-12-29 DIAGNOSIS — R300 Dysuria: Secondary | ICD-10-CM | POA: Diagnosis not present

## 2014-12-29 DIAGNOSIS — E785 Hyperlipidemia, unspecified: Secondary | ICD-10-CM | POA: Diagnosis not present

## 2014-12-29 DIAGNOSIS — E1122 Type 2 diabetes mellitus with diabetic chronic kidney disease: Secondary | ICD-10-CM | POA: Diagnosis not present

## 2014-12-29 DIAGNOSIS — F3342 Major depressive disorder, recurrent, in full remission: Secondary | ICD-10-CM | POA: Diagnosis not present

## 2015-02-04 DIAGNOSIS — F329 Major depressive disorder, single episode, unspecified: Secondary | ICD-10-CM | POA: Diagnosis not present

## 2015-02-04 DIAGNOSIS — E1165 Type 2 diabetes mellitus with hyperglycemia: Secondary | ICD-10-CM | POA: Diagnosis not present

## 2015-02-04 DIAGNOSIS — Z720 Tobacco use: Secondary | ICD-10-CM | POA: Diagnosis not present

## 2015-02-04 DIAGNOSIS — Z794 Long term (current) use of insulin: Secondary | ICD-10-CM | POA: Diagnosis not present

## 2015-02-14 ENCOUNTER — Encounter: Payer: Self-pay | Admitting: Internal Medicine

## 2015-02-14 ENCOUNTER — Other Ambulatory Visit: Payer: Self-pay

## 2015-03-23 DIAGNOSIS — M797 Fibromyalgia: Secondary | ICD-10-CM | POA: Diagnosis not present

## 2015-03-23 DIAGNOSIS — I129 Hypertensive chronic kidney disease with stage 1 through stage 4 chronic kidney disease, or unspecified chronic kidney disease: Secondary | ICD-10-CM | POA: Diagnosis not present

## 2015-03-23 DIAGNOSIS — E119 Type 2 diabetes mellitus without complications: Secondary | ICD-10-CM | POA: Diagnosis not present

## 2015-04-05 DIAGNOSIS — M5032 Other cervical disc degeneration, mid-cervical region: Secondary | ICD-10-CM | POA: Diagnosis not present

## 2015-04-05 DIAGNOSIS — M5441 Lumbago with sciatica, right side: Secondary | ICD-10-CM | POA: Diagnosis not present

## 2015-04-05 DIAGNOSIS — M542 Cervicalgia: Secondary | ICD-10-CM | POA: Diagnosis not present

## 2015-04-05 DIAGNOSIS — M5136 Other intervertebral disc degeneration, lumbar region: Secondary | ICD-10-CM | POA: Diagnosis not present

## 2015-04-06 DIAGNOSIS — Z23 Encounter for immunization: Secondary | ICD-10-CM | POA: Diagnosis not present

## 2015-04-06 DIAGNOSIS — E1165 Type 2 diabetes mellitus with hyperglycemia: Secondary | ICD-10-CM | POA: Diagnosis not present

## 2015-04-06 DIAGNOSIS — I4891 Unspecified atrial fibrillation: Secondary | ICD-10-CM | POA: Diagnosis not present

## 2015-04-06 DIAGNOSIS — Z7901 Long term (current) use of anticoagulants: Secondary | ICD-10-CM | POA: Diagnosis not present

## 2015-04-06 DIAGNOSIS — Z794 Long term (current) use of insulin: Secondary | ICD-10-CM | POA: Diagnosis not present

## 2015-04-06 DIAGNOSIS — I129 Hypertensive chronic kidney disease with stage 1 through stage 4 chronic kidney disease, or unspecified chronic kidney disease: Secondary | ICD-10-CM | POA: Diagnosis not present

## 2015-04-11 ENCOUNTER — Encounter: Payer: Self-pay | Admitting: Internal Medicine

## 2015-04-12 ENCOUNTER — Encounter: Payer: Self-pay | Admitting: Internal Medicine

## 2015-04-15 DIAGNOSIS — Z23 Encounter for immunization: Secondary | ICD-10-CM | POA: Diagnosis not present

## 2015-04-15 DIAGNOSIS — Z7901 Long term (current) use of anticoagulants: Secondary | ICD-10-CM | POA: Diagnosis not present

## 2015-04-22 DIAGNOSIS — I4891 Unspecified atrial fibrillation: Secondary | ICD-10-CM | POA: Diagnosis not present

## 2015-04-22 DIAGNOSIS — Z7901 Long term (current) use of anticoagulants: Secondary | ICD-10-CM | POA: Diagnosis not present

## 2015-05-18 ENCOUNTER — Ambulatory Visit (INDEPENDENT_AMBULATORY_CARE_PROVIDER_SITE_OTHER): Payer: Commercial Managed Care - HMO

## 2015-05-18 ENCOUNTER — Ambulatory Visit (INDEPENDENT_AMBULATORY_CARE_PROVIDER_SITE_OTHER): Payer: Commercial Managed Care - HMO | Admitting: Internal Medicine

## 2015-05-18 ENCOUNTER — Encounter: Payer: Self-pay | Admitting: Internal Medicine

## 2015-05-18 VITALS — BP 118/64 | HR 99 | Ht 66.0 in | Wt 224.8 lb

## 2015-05-18 DIAGNOSIS — I5023 Acute on chronic systolic (congestive) heart failure: Secondary | ICD-10-CM

## 2015-05-18 DIAGNOSIS — I519 Heart disease, unspecified: Secondary | ICD-10-CM

## 2015-05-18 DIAGNOSIS — I110 Hypertensive heart disease with heart failure: Secondary | ICD-10-CM | POA: Diagnosis not present

## 2015-05-18 DIAGNOSIS — N189 Chronic kidney disease, unspecified: Secondary | ICD-10-CM | POA: Insufficient documentation

## 2015-05-18 DIAGNOSIS — I5043 Acute on chronic combined systolic (congestive) and diastolic (congestive) heart failure: Secondary | ICD-10-CM

## 2015-05-18 DIAGNOSIS — I420 Dilated cardiomyopathy: Secondary | ICD-10-CM

## 2015-05-18 DIAGNOSIS — N183 Chronic kidney disease, stage 3 unspecified: Secondary | ICD-10-CM

## 2015-05-18 DIAGNOSIS — I255 Ischemic cardiomyopathy: Secondary | ICD-10-CM | POA: Diagnosis not present

## 2015-05-18 DIAGNOSIS — Z95810 Presence of automatic (implantable) cardiac defibrillator: Secondary | ICD-10-CM

## 2015-05-18 DIAGNOSIS — I119 Hypertensive heart disease without heart failure: Secondary | ICD-10-CM | POA: Insufficient documentation

## 2015-05-18 DIAGNOSIS — I509 Heart failure, unspecified: Secondary | ICD-10-CM

## 2015-05-18 LAB — CUP PACEART INCLINIC DEVICE CHECK
Brady Statistic AP VS Percent: 0.01 %
Brady Statistic AS VP Percent: 99.46 %
Brady Statistic AS VS Percent: 0.47 %
Brady Statistic RA Percent Paced: 0.07 %
Brady Statistic RV Percent Paced: 99.12 %
Date Time Interrogation Session: 20160928165325
HIGH POWER IMPEDANCE MEASURED VALUE: 171 Ohm
HIGH POWER IMPEDANCE MEASURED VALUE: 53 Ohm
HighPow Impedance: 44 Ohm
Lead Channel Impedance Value: 285 Ohm
Lead Channel Impedance Value: 513 Ohm
Lead Channel Impedance Value: 703 Ohm
Lead Channel Pacing Threshold Amplitude: 0.5 V
Lead Channel Pacing Threshold Amplitude: 1 V
Lead Channel Pacing Threshold Amplitude: 1.5 V
Lead Channel Pacing Threshold Pulse Width: 0.4 ms
Lead Channel Pacing Threshold Pulse Width: 0.4 ms
Lead Channel Sensing Intrinsic Amplitude: 17.75 mV
Lead Channel Setting Pacing Amplitude: 2.5 V
Lead Channel Setting Pacing Pulse Width: 0.4 ms
Lead Channel Setting Pacing Pulse Width: 0.4 ms
MDC IDC MSMT BATTERY REMAINING LONGEVITY: 62 mo
MDC IDC MSMT BATTERY VOLTAGE: 2.98 V
MDC IDC MSMT LEADCHNL LV IMPEDANCE VALUE: 665 Ohm
MDC IDC MSMT LEADCHNL RA IMPEDANCE VALUE: 399 Ohm
MDC IDC MSMT LEADCHNL RA SENSING INTR AMPL: 4.25 mV
MDC IDC MSMT LEADCHNL RV PACING THRESHOLD PULSEWIDTH: 0.4 ms
MDC IDC SET LEADCHNL LV PACING AMPLITUDE: 2.5 V
MDC IDC SET LEADCHNL RA PACING AMPLITUDE: 2 V
MDC IDC SET LEADCHNL RV SENSING SENSITIVITY: 0.3 mV
MDC IDC SET ZONE DETECTION INTERVAL: 290 ms
MDC IDC SET ZONE DETECTION INTERVAL: 400 ms
MDC IDC STAT BRADY AP VP PERCENT: 0.05 %
Zone Setting Detection Interval: 320 ms
Zone Setting Detection Interval: 450 ms

## 2015-05-18 NOTE — Progress Notes (Signed)
Electrophysiology Office Note   Date:  05/18/2015   ID:  [name], DOB [birth year], MRN [id number]  PCP:  Johny Blamer, MD  Cardiologist:  Dr Mayford Knife Primary Electrophysiologist: Hillis Range, MD    Chief Complaint  Patient presents with  . Atrial Fibrillation  . Ischemic dilated cardiomyopathy  . Chronic systolic dysfunction of LV     History of Present Illness: [name] is a 69 y.o. female who presents today for electrophysiology evaluation.   She has chronic difficulty with SOB.  She has not been seen in the EP device clinic in several years.  She reports that she does not take her lasix as directed.  She does not sleep well and feels fatigued.  She states that she stays up frequently at night "reading".  Today, she denies symptoms of palpitations, chest pain,   orthopnea, PND, lower extremity edema, claudication, dizziness, presyncope, syncope, bleeding, or neurologic sequela. The patient is tolerating medications without difficulties and is otherwise without complaint today.    Past Medical History  Diagnosis Date  . Asthma   . Depression   . Sjogren's syndrome   . Lupus   . Chronic systolic dysfunction of left ventricle   . Chronic renal insufficiency   . Diabetes mellitus   . Hypertension   . Hyperlipidemia   . Pulmonary embolism     previously on coumadin  . Chronic anticoagulation   . Obesity   . Ischemic dilated cardiomyopathy     s/p BiV AICD - EF now 49%  . Fibromyalgia   . Coronary artery disease 2002    s/p PCI of RCA  . Psoriasis   . RA (rheumatoid arthritis)   . LBBB (left bundle branch block)   . Atrial fibrillation   . Stroke     TIA   Past Surgical History  Procedure Laterality Date  . Mastectomy  1982  . Angioplasty  2002  . Hysterectomy (other)    . Biventricular defibrillator implantation  08/12/07    MDT Concerto implanted by Dr Amil Amen; generator change 03-18-2013 by [name]  . Biv pacemaker generator change out   03/18/2013    MDT Viva XR BIV ICD generator change by [name]  . Implantable cardioverter defibrillator generator change N/A 03/18/2013    Procedure: IMPLANTABLE CARDIOVERTER DEFIBRILLATOR GENERATOR CHANGE;  Surgeon: Hillis Range, MD;  Location: [facility] CATH LAB;  Service: Cardiovascular;  Laterality: N/A;     Current Outpatient Prescriptions  Medication Sig Dispense Refill  . Alogliptin Benzoate 12.5 MG TABS Take 1 tablet by mouth daily.  6  . atorvastatin (LIPITOR) 80 MG tablet Take 1 tablet (80 mg total) by mouth daily. 30 tablet 6  . buPROPion (WELLBUTRIN) 100 MG tablet Take 100 mg by mouth 2 (two) times daily.  1  . carisoprodol (SOMA) 350 MG tablet Take 350 mg by mouth daily as needed for muscle spasms (pain).     . carvedilol (COREG) 3.125 MG tablet TAKE 1 TABLET BY MOUTH TWICE DAILY 180 tablet 0  . Cholecalciferol (VITAMIN D) 2000 UNITS tablet Take 2,000 Units by mouth every evening.    . diclofenac sodium (VOLTAREN) 1 % GEL Apply 1 application topically daily as needed (pain).    Marland Kitchen diltiazem (TIAZAC) 240 MG 24 hr capsule Take 1 capsule (240 mg total) by mouth every evening. 30 capsule 11  . diphenhydrAMINE (BENADRYL) 25 MG tablet Take 25 mg by mouth daily as needed for allergies.    . DULoxetine (CYMBALTA) 60  MG capsule Take 2 tablets by mouth daily    . ezetimibe (ZETIA) 10 MG tablet Take 10 mg by mouth daily.    . furosemide (LASIX) 40 MG tablet Take 1 tablet (40 mg total) by mouth 2 (two) times daily. 60 tablet 3  . HUMALOG KWIKPEN 100 UNIT/ML KiwkPen Inject into the skin as directed.    . hydrALAZINE (APRESOLINE) 10 MG tablet Take 1 tablet (10 mg total) by mouth 4 (four) times daily. 120 tablet 0  . isosorbide dinitrate (ISORDIL) 30 MG tablet Take 15 mg by mouth daily.    Marland Kitchen LANTUS 100 UNIT/ML injection Inject 30 Units into the skin daily.  2  . LORazepam (ATIVAN) 1 MG tablet Take 1 mg by mouth 3 (three) times daily as needed for anxiety (sleep).     Marland Kitchen oxyCODONE-acetaminophen  (PERCOCET/ROXICET) 5-325 MG per tablet Take 1 tablet by mouth every 6 (six) hours as needed (pain).     . ranitidine (ZANTAC) 300 MG capsule Take 300 mg by mouth 2 (two) times daily.    Marland Kitchen rOPINIRole (REQUIP) 1 MG tablet Take 1 mg by mouth at bedtime as needed (sleep/restless legs).     Marland Kitchen spironolactone (ALDACTONE) 25 MG tablet Take 25 mg by mouth daily.    Marland Kitchen SYNTHROID 175 MCG tablet Take 175 mg by mouth daily.    Marland Kitchen tiZANidine (ZANAFLEX) 4 MG tablet Take 4 mg by mouth every 8 (eight) hours as needed. For muscle spasms  5  . Warfarin Sodium (COUMADIN PO) Take as directed by the coumadin clinic     No current facility-administered medications for this visit.    Allergies:   Contrast media; Latex; Penicillins; and Sulfonamide derivatives   Social History:  The patient  reports that she has quit smoking. She does not have any smokeless tobacco history on file. She reports that she does not drink alcohol or use illicit drugs.   Family History:  The patient's family history includes Heart attack in her father; Heart disease in her father.    ROS:  Please see the history of present illness.   All other systems are reviewed and negative.    PHYSICAL EXAM: VS:  BP 118/64 mmHg  Pulse 99  Ht 5\' 6"  (1.676 m)  Wt 224 lb 12.8 oz (101.969 kg)  BMI 36.30 kg/m2 , BMI Body mass index is 36.3 kg/(m^2). GEN: Well nourished, well developed, in no acute distress HEENT: normal Neck: +JVD, no carotid bruits, or masses Cardiac: RRR; no murmurs, rubs, or gallops,no edema  Respiratory: few basilar rales, normal work of breathing GI: soft, nontender, nondistended, + BS MS: no deformity or atrophy Skin: warm and dry, device pocket is well healed Neuro:  Strength and sensation are intact Psych: euthymic mood, full affect  EKG:  EKG is ordered today. The ekg ordered today shows sinus with BiV pacing  Device interrogation is reviewed today in detail.  See PaceArt for details.   Recent Labs: No results  found for requested labs within last 365 days.    Lipid Panel     Component Value Date/Time   CHOL  08/20/2010 0534    136        ATP III CLASSIFICATION:  <200     mg/dL   Desirable  10/19/2010  mg/dL   Borderline High  540-086    mg/dL   High          TRIG 82 08/20/2010 0534   HDL 40 08/20/2010 0534   CHOLHDL 3.4  08/20/2010 0534   VLDL 16 08/20/2010 0534   LDLCALC  08/20/2010 0534    80        Total Cholesterol/HDL:CHD Risk Coronary Heart Disease Risk Table                     Men   Women  1/2 Average Risk   3.4   3.3  Average Risk       5.0   4.4  2 X Average Risk   9.6   7.1  3 X Average Risk  23.4   11.0        Use the calculated Patient Ratio above and the CHD Risk Table to determine the patient's CHD Risk.        ATP III CLASSIFICATION (LDL):  <100     mg/dL   Optimal  147-829  mg/dL   Near or Above                    Optimal  130-159  mg/dL   Borderline  562-130  mg/dL   High  >865     mg/dL   Very High     Wt Readings from Last 3 Encounters:  05/18/15 224 lb 12.8 oz (101.969 kg)  11/11/14 221 lb (100.245 kg)  10/29/14 226 lb 9.6 oz (102.785 kg)      Other studies Reviewed: Additional studies/ records that were reviewed today include: Dr Malachy Mood notes, prior echo  Review of the above records today demonstrates:  Improved EF with CRT   ASSESSMENT AND PLAN:  1.  Acute on chronic systolic dysfunction optivol is elevated and she has CHF on exam. She does not take her lasix as directed Compliance with lasix is encouraged, specifically she should take lasix 40mg  BID x at least 3 days Check bmet, bnp today Enroll in ICD device clinic with Normal BiV ICD function See Pace Art report LV threshold (LV ring to LV tip) had increased slightly.  I have reprogrammed today LV ring to RV coil which had threshold of 1.25V@0 .4 msec.  No diaphragmatic stim at 8V@0 .Randon Goldsmith.  2. Atrial fibrillation Current in sinus rhythm Continue coumadin  3. CRI bmet  today  4. Hypertensive cardiovascular disease with CHF As above  Follow-up: merlin, ICM device clinic, return to see EP NP in 1 year Follow-up with Dr as scheduled  Current medicines are reviewed at length with the patient today.   The patient does not have concerns regarding her medicines.  The following changes were made today:  none   Signed, Mayford Knife, MD  05/18/2015 4:19 PM     [facility] [address] [place] [phone] (office) [phone] (fax)

## 2015-05-18 NOTE — Patient Instructions (Addendum)
Medication Instructions:  Your physician has recommended you make the following change in your medication:  1) Increase Furosemide to 40 mg twice daily for 3 days   Labwork: Your physician recommends that you return for lab work today: BMP/BNP     Testing/Procedures: None ordered  Follow-Up: Your physician wants you to follow-up in: 12 months with Gypsy Balsam, NP You will receive a reminder letter in the mail two months in advance. If you don't receive a letter, please call our office to schedule the follow-up appointment.  Remote monitoring is used to monitor your ICD from home. This monitoring reduces the number of office visits required to check your device to one time per year. It allows [place] to keep an eye on the functioning of your device to ensure it is working properly. You are scheduled for a device check from home on 08/17/15. You may send your transmission at any time that day. If you have a wireless device, the transmission will be sent automatically. After your physician reviews your transmission, you will receive a postcard with your next transmission date.  Randon Goldsmith, RN will follow you in the ICM clinic---see letter      Any Other Special Instructions Will Be Listed Below (If Applicable).

## 2015-05-19 ENCOUNTER — Ambulatory Visit: Payer: Self-pay | Admitting: *Deleted

## 2015-05-19 LAB — BASIC METABOLIC PANEL
BUN: 17 mg/dL (ref 6–23)
CALCIUM: 9.2 mg/dL (ref 8.4–10.5)
CO2: 23 meq/L (ref 19–32)
CREATININE: 1.31 mg/dL — AB (ref 0.40–1.20)
Chloride: 104 mEq/L (ref 96–112)
GFR: 42.8 mL/min — ABNORMAL LOW (ref >60.00)
Glucose, Bld: 146 mg/dL — ABNORMAL HIGH (ref 70–99)
Potassium: 3.5 mEq/L (ref 3.5–5.1)
Sodium: 138 mEq/L (ref 135–145)

## 2015-05-19 LAB — BRAIN NATRIURETIC PEPTIDE: PRO B NATRI PEPTIDE: 328 pg/mL — AB (ref 0.0–100.0)

## 2015-05-20 ENCOUNTER — Telehealth: Payer: Self-pay

## 2015-05-20 NOTE — Telephone Encounter (Signed)
Opened in error

## 2015-05-20 NOTE — Progress Notes (Signed)
ICM Enrollment intro during office visit with [name].  Enrollment letter given and scheduled 1st [employer] ICM transmission for 06/20/2015.  She agreed to monthly ICM follow up.

## 2015-05-23 DIAGNOSIS — F329 Major depressive disorder, single episode, unspecified: Secondary | ICD-10-CM | POA: Diagnosis not present

## 2015-05-23 DIAGNOSIS — Z7901 Long term (current) use of anticoagulants: Secondary | ICD-10-CM | POA: Diagnosis not present

## 2015-05-23 DIAGNOSIS — I10 Essential (primary) hypertension: Secondary | ICD-10-CM | POA: Diagnosis not present

## 2015-05-23 DIAGNOSIS — M797 Fibromyalgia: Secondary | ICD-10-CM | POA: Diagnosis not present

## 2015-05-23 DIAGNOSIS — J209 Acute bronchitis, unspecified: Secondary | ICD-10-CM | POA: Diagnosis not present

## 2015-06-09 ENCOUNTER — Ambulatory Visit: Payer: Self-pay | Admitting: *Deleted

## 2015-06-09 DIAGNOSIS — I481 Persistent atrial fibrillation: Secondary | ICD-10-CM | POA: Diagnosis not present

## 2015-06-09 DIAGNOSIS — J309 Allergic rhinitis, unspecified: Secondary | ICD-10-CM | POA: Diagnosis not present

## 2015-06-09 DIAGNOSIS — Z7901 Long term (current) use of anticoagulants: Secondary | ICD-10-CM | POA: Diagnosis not present

## 2015-06-09 DIAGNOSIS — J329 Chronic sinusitis, unspecified: Secondary | ICD-10-CM | POA: Diagnosis not present

## 2015-06-24 NOTE — Progress Notes (Signed)
1st schedule transmission for ICM follow up was not received.  Rescheduled for 07/27/2015.  Letter sent.

## 2015-07-27 ENCOUNTER — Telehealth: Payer: Self-pay | Admitting: Cardiology

## 2015-07-27 NOTE — Telephone Encounter (Signed)
LMOVM reminding pt to send remote transmission.   

## 2015-07-28 DIAGNOSIS — Z7901 Long term (current) use of anticoagulants: Secondary | ICD-10-CM | POA: Diagnosis not present

## 2015-07-28 DIAGNOSIS — I4891 Unspecified atrial fibrillation: Secondary | ICD-10-CM | POA: Diagnosis not present

## 2015-08-01 ENCOUNTER — Telehealth: Payer: Self-pay

## 2015-08-01 NOTE — Telephone Encounter (Signed)
Attempted ICM call to patient and left message for return call.   Patient has not been sent any home transmissions since enrollment on 05/18/2015 during office visit with [name].  Letter sent with new transmission date, 08/17/2015, and to call if assistance is needed to send remote transmission.

## 2015-08-17 ENCOUNTER — Ambulatory Visit (INDEPENDENT_AMBULATORY_CARE_PROVIDER_SITE_OTHER): Payer: Commercial Managed Care - HMO | Admitting: *Deleted

## 2015-08-17 ENCOUNTER — Telehealth: Payer: Self-pay | Admitting: Internal Medicine

## 2015-08-17 ENCOUNTER — Telehealth: Payer: Self-pay | Admitting: Cardiology

## 2015-08-17 DIAGNOSIS — I5023 Acute on chronic systolic (congestive) heart failure: Secondary | ICD-10-CM

## 2015-08-17 DIAGNOSIS — Z95810 Presence of automatic (implantable) cardiac defibrillator: Secondary | ICD-10-CM

## 2015-08-17 DIAGNOSIS — I420 Dilated cardiomyopathy: Principal | ICD-10-CM

## 2015-08-17 DIAGNOSIS — I255 Ischemic cardiomyopathy: Secondary | ICD-10-CM

## 2015-08-17 NOTE — Telephone Encounter (Signed)
Spoke w/ pt and let her know that we did receive her remote transmission. Pt verbalized understanding.

## 2015-08-17 NOTE — Telephone Encounter (Signed)
Spoke with pt and reminded pt of remote transmission that is due today. Pt verbalized understanding.   

## 2015-08-17 NOTE — Telephone Encounter (Signed)
°  1. Has your device fired? no ° °2. Is you device beeping? no ° °3. Are you experiencing draining or swelling at device site? no ° °4. Are you calling to see if we received your device transmission? yes ° °5. Have you passed out? no ° °

## 2015-08-17 NOTE — Progress Notes (Signed)
Remote ICD transmission.   

## 2015-08-19 NOTE — Progress Notes (Addendum)
EPIC Encounter for ICM Monitoring  Patient Name: [name] is a 69 y.o. female Date: 08/19/2015 Primary Care Physican: Johny Blamer, MD Primary Cardiologist: Mayford Knife Electrophysiologist: Alred Dry Weight: Does not weigh        In the past month, have you:  1. Gained more than 2 pounds in a day or more than 5 pounds in a week? Unknown.  Patient does not weigh and encouraged her to weigh daily at same time with same amount of clothes. She stated she has a scale and used to weigh but had gotten out of the habit.  Encouraged her to resume weighing daily to monitor for fluid weight of 2-3 pounds overnight and 5 pounds in a week.    2. Had changes in your medications (with verification of current medications)? no  3. Had more shortness of breath than is usual for you? Yes  4. Limited your activity because of shortness of breath? no  5. Not been able to sleep because of shortness of breath? No  6. Had increased swelling in your feet or ankles? no  7. Had symptoms of dehydration (dizziness, dry mouth, increased thirst, decreased urine output) no  8. Had changes in sodium restriction? no  9. Been compliant with medication? Yes   ICM trend: 08/17/2015 1 year view  ICM Trend:  3 month view    Follow-up plan: ICM clinic phone appointment on 08/29/2015 for repeat transmission.  1st ICM encounter.  Optivol thoracic impedance below baseline since ~ September with a few days at baseline suggesting fluid retention.  She stated she can tell she has some fluid due to some shortness of breath and stomach swelling.  She stated she was on Prednisone for a few months until November which she thinks caused some fluid retention.   She stated Dr Mayford Knife had instructed her to take extra tablet of Furosemide if she has symptoms.  Recommended she follow Dr Malachy Mood directions in taking Furosemide.  She stated she will take 2 tablets in the am and 1 in the pm x 3 days.  She reported compliance with  medications.     Advised will send to Dr Mayford Knife and [name] for review and if any further recommendations will call her back.  Repeat transmission on 08/29/2015.  Lab results on 05/18/2015: BNP 328, BUN 17, Creatinine 1.3 and Potassium 3.5.    Copy of note sent to patient's primary care physician, primary cardiologist, and device following physician.  Karie Soda, RN, CCM 08/19/2015 3:23 PM

## 2015-08-25 DIAGNOSIS — Z794 Long term (current) use of insulin: Secondary | ICD-10-CM | POA: Diagnosis not present

## 2015-08-25 DIAGNOSIS — E039 Hypothyroidism, unspecified: Secondary | ICD-10-CM | POA: Diagnosis not present

## 2015-08-25 DIAGNOSIS — Z7901 Long term (current) use of anticoagulants: Secondary | ICD-10-CM | POA: Diagnosis not present

## 2015-08-25 DIAGNOSIS — M797 Fibromyalgia: Secondary | ICD-10-CM | POA: Diagnosis not present

## 2015-08-25 DIAGNOSIS — E7800 Pure hypercholesterolemia, unspecified: Secondary | ICD-10-CM | POA: Diagnosis not present

## 2015-08-25 DIAGNOSIS — E1122 Type 2 diabetes mellitus with diabetic chronic kidney disease: Secondary | ICD-10-CM | POA: Diagnosis not present

## 2015-08-25 DIAGNOSIS — F3342 Major depressive disorder, recurrent, in full remission: Secondary | ICD-10-CM | POA: Diagnosis not present

## 2015-08-25 DIAGNOSIS — N183 Chronic kidney disease, stage 3 (moderate): Secondary | ICD-10-CM | POA: Diagnosis not present

## 2015-08-25 DIAGNOSIS — I10 Essential (primary) hypertension: Secondary | ICD-10-CM | POA: Diagnosis not present

## 2015-08-29 ENCOUNTER — Telehealth: Payer: Self-pay | Admitting: Cardiology

## 2015-08-29 NOTE — Telephone Encounter (Signed)
LMOVM reminding pt to send remote transmission.   

## 2015-08-30 ENCOUNTER — Telehealth: Payer: Self-pay

## 2015-08-30 ENCOUNTER — Telehealth: Payer: Self-pay | Admitting: Cardiology

## 2015-08-30 DIAGNOSIS — R0602 Shortness of breath: Secondary | ICD-10-CM

## 2015-08-30 NOTE — Telephone Encounter (Signed)
Left message for patient to call back to schedule lab work. BNP ordered for scheduling.

## 2015-08-30 NOTE — Telephone Encounter (Signed)
-----   Message from Quintella Reichert, MD sent at 08/20/2015  2:54 PM EST ----- Please have patient come in for BNP ----- Message -----    From: Karie Soda, RN    Sent: 08/19/2015   3:37 PM      To: Quintella Reichert, MD

## 2015-08-30 NOTE — Telephone Encounter (Signed)
Pt returned RN phone call- pt was sched for labwork 09/02/15.

## 2015-08-30 NOTE — Telephone Encounter (Signed)
Received call from patient.  She stated she will send the ICM remote transmission today.  She had been away from home due to the snow.

## 2015-09-02 ENCOUNTER — Other Ambulatory Visit (INDEPENDENT_AMBULATORY_CARE_PROVIDER_SITE_OTHER): Payer: Commercial Managed Care - HMO | Admitting: *Deleted

## 2015-09-02 DIAGNOSIS — R0602 Shortness of breath: Secondary | ICD-10-CM

## 2015-09-03 LAB — BRAIN NATRIURETIC PEPTIDE: Brain Natriuretic Peptide: 25 pg/mL (ref 0.0–100.0)

## 2015-09-05 DIAGNOSIS — M50322 Other cervical disc degeneration at C5-C6 level: Secondary | ICD-10-CM | POA: Diagnosis not present

## 2015-09-05 DIAGNOSIS — Z794 Long term (current) use of insulin: Secondary | ICD-10-CM | POA: Diagnosis not present

## 2015-09-05 DIAGNOSIS — E1165 Type 2 diabetes mellitus with hyperglycemia: Secondary | ICD-10-CM | POA: Diagnosis not present

## 2015-09-05 DIAGNOSIS — M5136 Other intervertebral disc degeneration, lumbar region: Secondary | ICD-10-CM | POA: Diagnosis not present

## 2015-09-05 DIAGNOSIS — Z13820 Encounter for screening for osteoporosis: Secondary | ICD-10-CM | POA: Diagnosis not present

## 2015-09-05 DIAGNOSIS — M25512 Pain in left shoulder: Secondary | ICD-10-CM | POA: Diagnosis not present

## 2015-09-05 DIAGNOSIS — M542 Cervicalgia: Secondary | ICD-10-CM | POA: Diagnosis not present

## 2015-09-05 DIAGNOSIS — M5441 Lumbago with sciatica, right side: Secondary | ICD-10-CM | POA: Diagnosis not present

## 2015-09-05 LAB — CUP PACEART REMOTE DEVICE CHECK
Brady Statistic AP VP Percent: 0.03 %
Brady Statistic AS VP Percent: 98.85 %
Brady Statistic RV Percent Paced: 98.32 %
Date Time Interrogation Session: 20161228183223
HIGH POWER IMPEDANCE MEASURED VALUE: 39 Ohm
HighPow Impedance: 47 Ohm
Implantable Lead Implant Date: 20081223
Implantable Lead Location: 753859
Implantable Lead Model: 4194
Implantable Lead Model: 5076
Lead Channel Impedance Value: 228 Ohm
Lead Channel Impedance Value: 570 Ohm
Lead Channel Impedance Value: 608 Ohm
Lead Channel Impedance Value: 665 Ohm
Lead Channel Pacing Threshold Amplitude: 0.75 V
Lead Channel Pacing Threshold Amplitude: 1.5 V
Lead Channel Pacing Threshold Pulse Width: 0.4 ms
Lead Channel Pacing Threshold Pulse Width: 0.4 ms
Lead Channel Sensing Intrinsic Amplitude: 17.25 mV
Lead Channel Sensing Intrinsic Amplitude: 3.375 mV
Lead Channel Setting Pacing Amplitude: 2 V
Lead Channel Setting Pacing Amplitude: 2.5 V
Lead Channel Setting Pacing Pulse Width: 0.4 ms
Lead Channel Setting Pacing Pulse Width: 0.4 ms
Lead Channel Setting Sensing Sensitivity: 0.3 mV
MDC IDC LEAD IMPLANT DT: 20081223
MDC IDC LEAD IMPLANT DT: 20081223
MDC IDC LEAD LOCATION: 753858
MDC IDC LEAD LOCATION: 753860
MDC IDC MSMT BATTERY REMAINING LONGEVITY: 55 mo
MDC IDC MSMT BATTERY VOLTAGE: 2.98 V
MDC IDC MSMT LEADCHNL LV IMPEDANCE VALUE: 494 Ohm
MDC IDC MSMT LEADCHNL RA IMPEDANCE VALUE: 399 Ohm
MDC IDC MSMT LEADCHNL RA SENSING INTR AMPL: 3.375 mV
MDC IDC MSMT LEADCHNL RV PACING THRESHOLD AMPLITUDE: 0.875 V
MDC IDC MSMT LEADCHNL RV PACING THRESHOLD PULSEWIDTH: 0.4 ms
MDC IDC MSMT LEADCHNL RV SENSING INTR AMPL: 17.25 mV
MDC IDC SET LEADCHNL LV PACING AMPLITUDE: 2.5 V
MDC IDC STAT BRADY AP VS PERCENT: 0.01 %
MDC IDC STAT BRADY AS VS PERCENT: 1.11 %
MDC IDC STAT BRADY RA PERCENT PACED: 0.04 %

## 2015-09-07 ENCOUNTER — Encounter: Payer: Self-pay | Admitting: Cardiology

## 2015-09-07 NOTE — Telephone Encounter (Signed)
No ICM remote transmission received.  New ICM transmission date 09/27/2015 and patient letter sent with new date.

## 2015-09-21 ENCOUNTER — Encounter: Payer: Self-pay | Admitting: Cardiology

## 2015-09-27 ENCOUNTER — Telehealth: Payer: Self-pay | Admitting: Cardiology

## 2015-09-27 NOTE — Telephone Encounter (Signed)
Spoke with pt and reminded pt of remote transmission that is due today. Pt verbalized understanding.   

## 2015-10-04 DIAGNOSIS — N184 Chronic kidney disease, stage 4 (severe): Secondary | ICD-10-CM | POA: Diagnosis not present

## 2015-10-05 NOTE — Progress Notes (Signed)
No ICM remote transmission received.  Next ICM remote transmission scheduled for 11/16/2015.

## 2015-10-21 DIAGNOSIS — M5136 Other intervertebral disc degeneration, lumbar region: Secondary | ICD-10-CM | POA: Diagnosis not present

## 2015-10-21 DIAGNOSIS — M542 Cervicalgia: Secondary | ICD-10-CM | POA: Diagnosis not present

## 2015-10-21 DIAGNOSIS — G8929 Other chronic pain: Secondary | ICD-10-CM | POA: Diagnosis not present

## 2015-10-21 DIAGNOSIS — M5441 Lumbago with sciatica, right side: Secondary | ICD-10-CM | POA: Diagnosis not present

## 2015-11-04 ENCOUNTER — Telehealth: Payer: Self-pay | Admitting: Cardiology

## 2015-11-04 ENCOUNTER — Emergency Department (HOSPITAL_COMMUNITY): Payer: Commercial Managed Care - HMO

## 2015-11-04 ENCOUNTER — Emergency Department (HOSPITAL_COMMUNITY)
Admission: EM | Admit: 2015-11-04 | Discharge: 2015-11-04 | Disposition: A | Discharge status: Home / Self Care | Payer: Commercial Managed Care - HMO | Attending: Emergency Medicine | Admitting: Emergency Medicine

## 2015-11-04 ENCOUNTER — Encounter (HOSPITAL_COMMUNITY): Payer: Self-pay | Admitting: *Deleted

## 2015-11-04 DIAGNOSIS — Z91040 Latex allergy status: Secondary | ICD-10-CM | POA: Insufficient documentation

## 2015-11-04 DIAGNOSIS — F329 Major depressive disorder, single episode, unspecified: Secondary | ICD-10-CM | POA: Insufficient documentation

## 2015-11-04 DIAGNOSIS — I519 Heart disease, unspecified: Secondary | ICD-10-CM | POA: Insufficient documentation

## 2015-11-04 DIAGNOSIS — Z86711 Personal history of pulmonary embolism: Secondary | ICD-10-CM | POA: Insufficient documentation

## 2015-11-04 DIAGNOSIS — Z79899 Other long term (current) drug therapy: Secondary | ICD-10-CM | POA: Diagnosis not present

## 2015-11-04 DIAGNOSIS — I129 Hypertensive chronic kidney disease with stage 1 through stage 4 chronic kidney disease, or unspecified chronic kidney disease: Secondary | ICD-10-CM | POA: Insufficient documentation

## 2015-11-04 DIAGNOSIS — E119 Type 2 diabetes mellitus without complications: Secondary | ICD-10-CM | POA: Insufficient documentation

## 2015-11-04 DIAGNOSIS — N189 Chronic kidney disease, unspecified: Secondary | ICD-10-CM | POA: Insufficient documentation

## 2015-11-04 DIAGNOSIS — R079 Chest pain, unspecified: Secondary | ICD-10-CM | POA: Diagnosis not present

## 2015-11-04 DIAGNOSIS — I2510 Atherosclerotic heart disease of native coronary artery without angina pectoris: Secondary | ICD-10-CM | POA: Insufficient documentation

## 2015-11-04 DIAGNOSIS — Z7901 Long term (current) use of anticoagulants: Secondary | ICD-10-CM | POA: Insufficient documentation

## 2015-11-04 DIAGNOSIS — J45909 Unspecified asthma, uncomplicated: Secondary | ICD-10-CM | POA: Insufficient documentation

## 2015-11-04 DIAGNOSIS — Z880 Allergy status to penicillin: Secondary | ICD-10-CM | POA: Diagnosis not present

## 2015-11-04 DIAGNOSIS — Z872 Personal history of diseases of the skin and subcutaneous tissue: Secondary | ICD-10-CM | POA: Insufficient documentation

## 2015-11-04 DIAGNOSIS — E669 Obesity, unspecified: Secondary | ICD-10-CM | POA: Diagnosis not present

## 2015-11-04 DIAGNOSIS — R072 Precordial pain: Secondary | ICD-10-CM | POA: Diagnosis not present

## 2015-11-04 DIAGNOSIS — Z8673 Personal history of transient ischemic attack (TIA), and cerebral infarction without residual deficits: Secondary | ICD-10-CM | POA: Insufficient documentation

## 2015-11-04 DIAGNOSIS — Z87891 Personal history of nicotine dependence: Secondary | ICD-10-CM | POA: Diagnosis not present

## 2015-11-04 LAB — CBC
HCT: 36.8 % (ref 36.0–46.0)
Hemoglobin: 12.3 g/dL (ref 12.0–15.0)
MCH: 30.4 pg (ref 26.0–34.0)
MCHC: 33.4 g/dL (ref 30.0–36.0)
MCV: 90.9 fL (ref 78.0–100.0)
Platelets: 167 10*3/uL (ref 150–400)
RBC: 4.05 MIL/uL (ref 3.87–5.11)
RDW: 13.3 % (ref 11.5–15.5)
WBC: 8.1 10*3/uL (ref 4.0–10.5)

## 2015-11-04 LAB — BASIC METABOLIC PANEL WITH GFR
Anion gap: 14 (ref 5–15)
BUN: 19 mg/dL (ref 6–20)
CO2: 24 mmol/L (ref 22–32)
Calcium: 9.1 mg/dL (ref 8.9–10.3)
Chloride: 103 mmol/L (ref 101–111)
Creatinine, Ser: 1.49 mg/dL — ABNORMAL HIGH (ref 0.44–1.00)
GFR calc Af Amer: 40 mL/min — ABNORMAL LOW
GFR calc non Af Amer: 35 mL/min — ABNORMAL LOW
Glucose, Bld: 138 mg/dL — ABNORMAL HIGH (ref 65–99)
Potassium: 3.4 mmol/L — ABNORMAL LOW (ref 3.5–5.1)
Sodium: 141 mmol/L (ref 135–145)

## 2015-11-04 LAB — PROTIME-INR
INR: 0.98 (ref 0.00–1.49)
PROTHROMBIN TIME: 13.2 s (ref 11.6–15.2)

## 2015-11-04 LAB — I-STAT TROPONIN, ED: Troponin i, poc: 0.01 ng/mL (ref 0.00–0.08)

## 2015-11-04 MED ORDER — WARFARIN - PHYSICIAN DOSING INPATIENT: Freq: Every day | Status: DC

## 2015-11-04 MED ORDER — WARFARIN SODIUM 10 MG PO TABS
10.0000 mg | ORAL_TABLET | Freq: Once | ORAL | Status: AC
  Administered 2015-11-04: 10 mg via ORAL
  Filled 2015-11-04: qty 1

## 2015-11-04 NOTE — Telephone Encounter (Signed)
Spoke with pt, for the last couple days the pt has been having a discomfort in the center of her chest that goes under her shoulder blades. It comes and goes but has occurred at least daily for the last couple days. She denies SOB but has had unexplained nausea. The discomfort will last up to one minute. The discomfort usually occurs at rest and is similar to the pain she had prior to her heart attack. She has no NTG in the home. Advised pt to go to the ER for evaluation. Patient voiced understanding to not drive.

## 2015-11-04 NOTE — ED Notes (Signed)
Pt reports mid chest pains x 3 days, radiates into her back under shoulder blades. Has mild nausea, denies sob. ekg done.

## 2015-11-04 NOTE — Discharge Instructions (Signed)
Nonspecific Chest Pain  °Chest pain can be caused by many different conditions. There is always a chance that your pain could be related to something serious, such as a heart attack or a blood clot in your lungs. Chest pain can also be caused by conditions that are not life-threatening. If you have chest pain, it is very important to follow up with your health care provider. °CAUSES  °Chest pain can be caused by: °· Heartburn. °· Pneumonia or bronchitis. °· Anxiety or stress. °· Inflammation around your heart (pericarditis) or lung (pleuritis or pleurisy). °· A blood clot in your lung. °· A collapsed lung (pneumothorax). It can develop suddenly on its own (spontaneous pneumothorax) or from trauma to the chest. °· Shingles infection (varicella-zoster virus). °· Heart attack. °· Damage to the bones, muscles, and cartilage that make up your chest wall. This can include: °¨ Bruised bones due to injury. °¨ Strained muscles or cartilage due to frequent or repeated coughing or overwork. °¨ Fracture to one or more ribs. °¨ Sore cartilage due to inflammation (costochondritis). °RISK FACTORS  °Risk factors for chest pain may include: °· Activities that increase your risk for trauma or injury to your chest. °· Respiratory infections or conditions that cause frequent coughing. °· Medical conditions or overeating that can cause heartburn. °· Heart disease or family history of heart disease. °· Conditions or health behaviors that increase your risk of developing a blood clot. °· Having had chicken pox (varicella zoster). °SIGNS AND SYMPTOMS °Chest pain can feel like: °· Burning or tingling on the surface of your chest or deep in your chest. °· Crushing, pressure, aching, or squeezing pain. °· Dull or sharp pain that is worse when you move, cough, or take a deep breath. °· Pain that is also felt in your back, neck, shoulder, or arm, or pain that spreads to any of these areas. °Your chest pain may come and go, or it may stay  constant. °DIAGNOSIS °Lab tests or other studies may be needed to find the cause of your pain. Your health care provider may have you take a test called an ambulatory ECG (electrocardiogram). An ECG records your heartbeat patterns at the time the test is performed. You may also have other tests, such as: °· Transthoracic echocardiogram (TTE). During echocardiography, sound waves are used to create a picture of all of the heart structures and to look at how blood flows through your heart. °· Transesophageal echocardiogram (TEE). This is a more advanced imaging test that obtains images from inside your body. It allows your health care provider to see your heart in finer detail. °· Cardiac monitoring. This allows your health care provider to monitor your heart rate and rhythm in real time. °· Holter monitor. This is a portable device that records your heartbeat and can help to diagnose abnormal heartbeats. It allows your health care provider to track your heart activity for several days, if needed. °· Stress tests. These can be done through exercise or by taking medicine that makes your heart beat more quickly. °· Blood tests. °· Imaging tests. °TREATMENT  °Your treatment depends on what is causing your chest pain. Treatment may include: °· Medicines. These may include: °¨ Acid blockers for heartburn. °¨ Anti-inflammatory medicine. °¨ Pain medicine for inflammatory conditions. °¨ Antibiotic medicine, if an infection is present. °¨ Medicines to dissolve blood clots. °¨ Medicines to treat coronary artery disease. °· Supportive care for conditions that do not require medicines. This may include: °¨ Resting. °¨ Applying heat   or cold packs to injured areas. °¨ Limiting activities until pain decreases. °HOME CARE INSTRUCTIONS °· If you were prescribed an antibiotic medicine, finish it all even if you start to feel better. °· Avoid any activities that bring on chest pain. °· Do not use any tobacco products, including  cigarettes, chewing tobacco, or electronic cigarettes. If you need help quitting, ask your health care provider. °· Do not drink alcohol. °· Take medicines only as directed by your health care provider. °· Keep all follow-up visits as directed by your health care provider. This is important. This includes any further testing if your chest pain does not go away. °· If heartburn is the cause for your chest pain, you may be told to keep your head raised (elevated) while sleeping. This reduces the chance that acid will go from your stomach into your esophagus. °· Make lifestyle changes as directed by your health care provider. These may include: °¨ Getting regular exercise. Ask your health care provider to suggest some activities that are safe for you. °¨ Eating a heart-healthy diet. A registered dietitian can help you to learn healthy eating options. °¨ Maintaining a healthy weight. °¨ Managing diabetes, if necessary. °¨ Reducing stress. °SEEK MEDICAL CARE IF: °· Your chest pain does not go away after treatment. °· You have a rash with blisters on your chest. °· You have a fever. °SEEK IMMEDIATE MEDICAL CARE IF:  °· Your chest pain is worse. °· You have an increasing cough, or you cough up blood. °· You have severe abdominal pain. °· You have severe weakness. °· You faint. °· You have chills. °· You have sudden, unexplained chest discomfort. °· You have sudden, unexplained discomfort in your arms, back, neck, or jaw. °· You have shortness of breath at any time. °· You suddenly start to sweat, or your skin gets clammy. °· You feel nauseous or you vomit. °· You suddenly feel light-headed or dizzy. °· Your heart begins to beat quickly, or it feels like it is skipping beats. °These symptoms may represent a serious problem that is an emergency. Do not wait to see if the symptoms will go away. Get medical help right away. Call your local emergency services (911 in the [place] Do not drive yourself to the hospital. °  °This  information is not intended to replace advice given to you by your health care provider. Make sure you discuss any questions you have with your health care provider. °  °Document Released: 05/16/2005 Document Revised: 08/27/2014 Document Reviewed: 03/12/2014 °Elsevier Interactive Patient Education ©2016 [employer] ° °

## 2015-11-04 NOTE — ED Provider Notes (Signed)
CSN: [id number]     Arrival date & time 11/04/15  1531 History   First MD Initiated Contact with Patient 11/04/15 1843     Chief Complaint  Patient presents with  . Chest Pain     (Consider location/radiation/quality/duration/timing/severity/associated sxs/prior Treatment) HPI   70 year old female with chest pain. Substernal. Onset about 3 days ago. Pain is relatively constant. No appreciable exacerbating relieving factors. Sometimes feels and her back, but not consistently. No cough. No dyspnea. No fevers or chills. No unusual leg pain or swelling. She has a past history of pulmonary embolism. She is on warfarin. She reports compliance.  Past Medical History  Diagnosis Date  . Asthma   . Depression   . Sjogren's syndrome (HCC)   . Lupus (HCC)   . Chronic systolic dysfunction of left ventricle   . Chronic renal insufficiency   . Diabetes mellitus   . Hypertension   . Hyperlipidemia   . Pulmonary embolism (HCC)     previously on coumadin  . Chronic anticoagulation   . Obesity   . Ischemic dilated cardiomyopathy     s/p BiV AICD - EF now 49%  . Fibromyalgia   . Coronary artery disease 2002    s/p PCI of RCA  . Psoriasis   . RA (rheumatoid arthritis) (HCC)   . LBBB (left bundle branch block)   . Atrial fibrillation (HCC)   . Stroke [facility])     TIA   Past Surgical History  Procedure Laterality Date  . Mastectomy  1982  . Angioplasty  2002  . Hysterectomy (other)    . Biventricular defibrillator implantation  08/12/07    MDT Concerto implanted by Dr Amil Amen; generator change 03-18-2013 by [name]  . Biv pacemaker generator change out  03/18/2013    MDT Viva XR BIV ICD generator change by [name]  . Implantable cardioverter defibrillator generator change N/A 03/18/2013    Procedure: IMPLANTABLE CARDIOVERTER DEFIBRILLATOR GENERATOR CHANGE;  Surgeon: Hillis Range, MD;  Location: [facility] CATH LAB;  Service: Cardiovascular;  Laterality: N/A;   Family History  Problem Relation  Age of Onset  . Heart disease Father   . Heart attack Father    Social History  Substance Use Topics  . Smoking status: Former Games developer  . Smokeless tobacco: None  . Alcohol Use: No   OB History    No data available     Review of Systems  All systems reviewed and negative, other than as noted in HPI.   Allergies  Contrast media; Levothyroxine; Latex; Penicillins; and Sulfonamide derivatives  Home Medications   Prior to Admission medications   Medication Sig Start Date End Date Taking? Authorizing Provider  Alogliptin Benzoate 12.5 MG TABS Take 1 tablet by mouth daily. 05/06/15  Yes Historical Provider, MD  atorvastatin (LIPITOR) 80 MG tablet Take 1 tablet (80 mg total) by mouth daily. 12/15/14  Yes Quintella Reichert, MD  buPROPion (WELLBUTRIN) 100 MG tablet Take 100 mg by mouth 2 (two) times daily. 05/06/15  Yes Historical Provider, MD  carisoprodol (SOMA) 350 MG tablet Take 350 mg by mouth daily as needed for muscle spasms (pain).  01/01/13  Yes Historical Provider, MD  carvedilol (COREG) 3.125 MG tablet TAKE 1 TABLET BY MOUTH TWICE DAILY 06/25/14  Yes Quintella Reichert, MD  Cholecalciferol (VITAMIN D) 2000 UNITS tablet Take 2,000 Units by mouth every evening.   Yes Historical Provider, MD  diclofenac sodium (VOLTAREN) 1 % GEL Apply 1 application topically daily as needed (pain).  Yes Historical Provider, MD  diltiazem (TIAZAC) 240 MG 24 hr capsule Take 1 capsule (240 mg total) by mouth every evening. 10/29/14  Yes Quintella Reichert, MD  diphenhydrAMINE (BENADRYL) 25 MG tablet Take 25 mg by mouth daily as needed for allergies.   Yes Historical Provider, MD  DULoxetine (CYMBALTA) 60 MG capsule Take 60 mg by mouth 2 (two) times daily.  10/22/14  Yes Historical Provider, MD  furosemide (LASIX) 40 MG tablet Take 1 tablet (40 mg total) by mouth 2 (two) times daily. 09/21/13  Yes Quintella Reichert, MD  hydrALAZINE (APRESOLINE) 10 MG tablet Take 1 tablet (10 mg total) by mouth 4 (four) times daily. 04/13/14   Yes Quintella Reichert, MD  hyoscyamine (LEVSIN SL) 0.125 MG SL tablet Place 1 tablet under the tongue daily as needed. 10/05/15  Yes Historical Provider, MD  isosorbide dinitrate (ISORDIL) 30 MG tablet Take 15 mg by mouth daily.   Yes Historical Provider, MD  LANTUS 100 UNIT/ML injection Inject 36 Units into the skin at bedtime.  03/23/15  Yes Historical Provider, MD  linagliptin (TRADJENTA) 5 MG TABS tablet Take 5 mg by mouth daily.   Yes Historical Provider, MD  LORazepam (ATIVAN) 1 MG tablet Take 1 mg by mouth 3 (three) times daily as needed for anxiety (sleep).    Yes Historical Provider, MD  oxyCODONE-acetaminophen (PERCOCET/ROXICET) 5-325 MG per tablet Take 1 tablet by mouth every 6 (six) hours as needed (pain).  06/19/13  Yes Historical Provider, MD  ranitidine (ZANTAC) 300 MG capsule Take 300 mg by mouth 2 (two) times daily. 09/29/14  Yes Historical Provider, MD  rOPINIRole (REQUIP) 1 MG tablet Take 1 mg by mouth at bedtime as needed (sleep/restless legs).  02/07/13  Yes Historical Provider, MD  spironolactone (ALDACTONE) 25 MG tablet Take 25 mg by mouth daily. 02/07/13  Yes Historical Provider, MD  SYNTHROID 150 MCG tablet Take 150 mg by mouth daily before breakfast. 10/31/15  Yes Historical Provider, MD  tiZANidine (ZANAFLEX) 4 MG tablet Take 4 mg by mouth every 8 (eight) hours as needed. For muscle spasms 09/02/14  Yes Historical Provider, MD  warfarin (COUMADIN) 10 MG tablet Take 10 mg by mouth daily at 6 PM.   Yes Historical Provider, MD  ezetimibe (ZETIA) 10 MG tablet Take 10 mg by mouth daily.    Historical Provider, MD  SYNTHROID 175 MCG tablet Take 175 mg by mouth daily before breakfast.  10/11/14   Historical Provider, MD   BP 113/60 mmHg  Pulse 89  Temp(Src) 97.5 F (36.4 C) (Oral)  Resp 20  Ht 5\' 6"  (1.676 m)  SpO2 96% Physical Exam  Constitutional: She appears well-developed and well-nourished. No distress.  Sitting in bed. Appears well.   HENT:  Head: Normocephalic and  atraumatic.  Eyes: Conjunctivae are normal. Right eye exhibits no discharge. Left eye exhibits no discharge.  Neck: Neck supple.  Cardiovascular: Normal rate, regular rhythm and normal heart sounds.  Exam reveals no gallop and no friction rub.   No murmur heard. Pulmonary/Chest: Effort normal and breath sounds normal. No respiratory distress.  Abdominal: Soft. She exhibits no distension. There is no tenderness.  Musculoskeletal: She exhibits no edema or tenderness.  Neurological: She is alert.  Skin: Skin is warm and dry.  Psychiatric: She has a normal mood and affect. Her behavior is normal. Thought content normal.  Nursing note and vitals reviewed.   ED Course  Procedures (including critical care time) Labs Review Labs Reviewed  BASIC  METABOLIC PANEL - Abnormal; Notable for the following:    Potassium 3.4 (*)    Glucose, Bld 138 (*)    Creatinine, Ser 1.49 (*)    GFR calc non Af Amer 35 (*)    GFR calc Af Amer 40 (*)    All other components within normal limits  CBC  PROTIME-INR  I-STAT TROPOININ, ED    Imaging Review Dg Chest 2 View  11/04/2015  CLINICAL DATA:  Chest pain for 3 days, asthma, diabetes mellitus, hypertension, pacemaker, prior pulmonary embolism, former smoker, coronary artery disease, fibromyalgia EXAM: CHEST  2 VIEW COMPARISON:  03/04/2014 FINDINGS: LEFT subclavian transvenous pacemaker/ AICD leads project at RIGHT atrium, RIGHT ventricle, and coronary sinus. Minimal enlargement of cardiac silhouette. Mediastinal contours and pulmonary vascularity normal. BILATERAL nipple shadows, corresponding to nipple markers on previous exam. No definite acute infiltrate, pleural effusion or pneumothorax. Bones demineralized. IMPRESSION: Enlargement of cardiac silhouette post AICD. No acute abnormalities. Electronically Signed   By: Ulyses Southward M.D.   On: 11/04/2015 16:20   I have personally reviewed and evaluated these images and lab results as part of my medical  decision-making.   EKG Interpretation   Date/Time:  Friday November 04 2015 15:35:46 EDT Ventricular Rate:  92 PR Interval:  118 QRS Duration: 128 QT Interval:  408 QTC Calculation: 504 R Axis:   167 Text Interpretation:  Atrial-sensed ventricular-paced rhythm Abnormal ECG  Confirmed by Juleen China  MD, Gaylene Moylan ([id number]) on 11/04/2015 6:43:24 PM      MDM   Final diagnoses:  Chest pain, unspecified chest pain type    70 year old female with intermittent chest pain for the last 3 days. Currently asymptomatic. Atypical symptoms and that episodes are very brief.  It has not been any more frequent or more severe during times of exertion over the past couple days. She does have a past history of pulmonary embolism as well and her INR is normal. She reports compliance with her medications will I do have some doubts. Prior cardiology note mentions noncompliance with at least some of her medicines. That being said, I doubt symptoms are from a PE. No clinically signs/symptoms of DVT on exam today. Pain is not pleuritic. Denies dyspnea. Vitals fine. Recommended taking extra 1/2 dose (5mg ) of coumadin for the next three days and having INR rechecked next week. Consider dissection, particularly with pain in back as well but I have a low suspicion for this as well. Looks like she has not seen her primary cardiologist, Dr in about two years. Advised she call for an appointment.     Mayford Knife, MD 11/09/15 [phone]

## 2015-11-04 NOTE — Telephone Encounter (Signed)
New message      Pt c/o of Chest Pain: STAT if CP now or developed within 24 hours  1. Are you having CP right now? Not now but she did have it a few minutes ago 2. Are you experiencing any other symptoms (ex. SOB, nausea, vomiting, sweating)? Little nausea  3. How long have you been experiencing CP?  Started wednesday  4. Is your CP continuous or coming and going?  Comes and goes  5. Have you taken Nitroglycerin? ?no

## 2015-11-11 ENCOUNTER — Other Ambulatory Visit: Payer: Self-pay | Admitting: *Deleted

## 2015-11-11 MED ORDER — SPIRONOLACTONE 25 MG PO TABS: 25.0000 mg | ORAL_TABLET | Freq: Every day | ORAL | Status: DC

## 2015-11-16 ENCOUNTER — Telehealth: Payer: Self-pay | Admitting: Cardiology

## 2015-11-16 ENCOUNTER — Encounter: Payer: Commercial Managed Care - HMO | Admitting: *Deleted

## 2015-11-16 NOTE — Telephone Encounter (Signed)
Spoke with pt and reminded pt of remote transmission that is due today. Pt verbalized understanding.   

## 2015-11-17 NOTE — Progress Notes (Signed)
No ICM remote transmission received for 11/16/2015.  Have not received any ICM remote transmissions since enrollment 04/2015.  No longer followed for ICM clinic since unable to reach patient and no transmissions received.

## 2015-11-18 ENCOUNTER — Encounter: Payer: Self-pay | Admitting: Cardiology

## 2015-12-06 DIAGNOSIS — Z780 Asymptomatic menopausal state: Secondary | ICD-10-CM | POA: Diagnosis not present

## 2015-12-06 DIAGNOSIS — E039 Hypothyroidism, unspecified: Secondary | ICD-10-CM | POA: Diagnosis not present

## 2015-12-06 DIAGNOSIS — M8589 Other specified disorders of bone density and structure, multiple sites: Secondary | ICD-10-CM | POA: Diagnosis not present

## 2015-12-06 DIAGNOSIS — Z7901 Long term (current) use of anticoagulants: Secondary | ICD-10-CM | POA: Diagnosis not present

## 2015-12-06 DIAGNOSIS — I2699 Other pulmonary embolism without acute cor pulmonale: Secondary | ICD-10-CM | POA: Diagnosis not present

## 2015-12-15 ENCOUNTER — Other Ambulatory Visit: Payer: Self-pay | Admitting: Cardiology

## 2015-12-22 DIAGNOSIS — Z7901 Long term (current) use of anticoagulants: Secondary | ICD-10-CM | POA: Diagnosis not present

## 2015-12-22 DIAGNOSIS — J0110 Acute frontal sinusitis, unspecified: Secondary | ICD-10-CM | POA: Diagnosis not present

## 2015-12-30 DIAGNOSIS — Z7901 Long term (current) use of anticoagulants: Secondary | ICD-10-CM | POA: Diagnosis not present

## 2015-12-30 DIAGNOSIS — I2699 Other pulmonary embolism without acute cor pulmonale: Secondary | ICD-10-CM | POA: Diagnosis not present

## 2016-01-10 DIAGNOSIS — Z7901 Long term (current) use of anticoagulants: Secondary | ICD-10-CM | POA: Diagnosis not present

## 2016-01-10 DIAGNOSIS — I2699 Other pulmonary embolism without acute cor pulmonale: Secondary | ICD-10-CM | POA: Diagnosis not present

## 2016-01-18 DIAGNOSIS — Z7901 Long term (current) use of anticoagulants: Secondary | ICD-10-CM | POA: Diagnosis not present

## 2016-01-18 DIAGNOSIS — I2699 Other pulmonary embolism without acute cor pulmonale: Secondary | ICD-10-CM | POA: Diagnosis not present

## 2016-01-26 DIAGNOSIS — Z7901 Long term (current) use of anticoagulants: Secondary | ICD-10-CM | POA: Diagnosis not present

## 2016-01-26 DIAGNOSIS — I2699 Other pulmonary embolism without acute cor pulmonale: Secondary | ICD-10-CM | POA: Diagnosis not present

## 2016-01-28 ENCOUNTER — Other Ambulatory Visit: Payer: Self-pay | Admitting: Cardiology

## 2016-02-02 DIAGNOSIS — E039 Hypothyroidism, unspecified: Secondary | ICD-10-CM | POA: Diagnosis not present

## 2016-02-02 DIAGNOSIS — E7800 Pure hypercholesterolemia, unspecified: Secondary | ICD-10-CM | POA: Diagnosis not present

## 2016-02-02 DIAGNOSIS — F324 Major depressive disorder, single episode, in partial remission: Secondary | ICD-10-CM | POA: Diagnosis not present

## 2016-02-02 DIAGNOSIS — E1122 Type 2 diabetes mellitus with diabetic chronic kidney disease: Secondary | ICD-10-CM | POA: Diagnosis not present

## 2016-02-02 DIAGNOSIS — N183 Chronic kidney disease, stage 3 (moderate): Secondary | ICD-10-CM | POA: Diagnosis not present

## 2016-02-02 DIAGNOSIS — J0110 Acute frontal sinusitis, unspecified: Secondary | ICD-10-CM | POA: Diagnosis not present

## 2016-02-02 DIAGNOSIS — I10 Essential (primary) hypertension: Secondary | ICD-10-CM | POA: Diagnosis not present

## 2016-02-02 DIAGNOSIS — M797 Fibromyalgia: Secondary | ICD-10-CM | POA: Diagnosis not present

## 2016-02-02 DIAGNOSIS — G2581 Restless legs syndrome: Secondary | ICD-10-CM | POA: Diagnosis not present

## 2016-02-09 DIAGNOSIS — Z7901 Long term (current) use of anticoagulants: Secondary | ICD-10-CM | POA: Diagnosis not present

## 2016-02-09 DIAGNOSIS — Z7984 Long term (current) use of oral hypoglycemic drugs: Secondary | ICD-10-CM | POA: Diagnosis not present

## 2016-02-09 DIAGNOSIS — N183 Chronic kidney disease, stage 3 (moderate): Secondary | ICD-10-CM | POA: Diagnosis not present

## 2016-02-09 DIAGNOSIS — E1122 Type 2 diabetes mellitus with diabetic chronic kidney disease: Secondary | ICD-10-CM | POA: Diagnosis not present

## 2016-02-09 DIAGNOSIS — E7800 Pure hypercholesterolemia, unspecified: Secondary | ICD-10-CM | POA: Diagnosis not present

## 2016-02-13 DIAGNOSIS — E1165 Type 2 diabetes mellitus with hyperglycemia: Secondary | ICD-10-CM | POA: Diagnosis not present

## 2016-02-13 DIAGNOSIS — Z7984 Long term (current) use of oral hypoglycemic drugs: Secondary | ICD-10-CM | POA: Diagnosis not present

## 2016-02-20 DIAGNOSIS — M7542 Impingement syndrome of left shoulder: Secondary | ICD-10-CM | POA: Diagnosis not present

## 2016-02-28 DIAGNOSIS — I4891 Unspecified atrial fibrillation: Secondary | ICD-10-CM | POA: Diagnosis not present

## 2016-02-28 DIAGNOSIS — Z7901 Long term (current) use of anticoagulants: Secondary | ICD-10-CM | POA: Diagnosis not present

## 2016-03-06 DIAGNOSIS — Z7901 Long term (current) use of anticoagulants: Secondary | ICD-10-CM | POA: Diagnosis not present

## 2016-03-06 DIAGNOSIS — I2699 Other pulmonary embolism without acute cor pulmonale: Secondary | ICD-10-CM | POA: Diagnosis not present

## 2016-04-05 DIAGNOSIS — Z7901 Long term (current) use of anticoagulants: Secondary | ICD-10-CM | POA: Diagnosis not present

## 2016-04-05 DIAGNOSIS — I2699 Other pulmonary embolism without acute cor pulmonale: Secondary | ICD-10-CM | POA: Diagnosis not present

## 2016-04-12 ENCOUNTER — Other Ambulatory Visit: Payer: Self-pay | Admitting: Physical Medicine and Rehabilitation

## 2016-04-12 DIAGNOSIS — M5136 Other intervertebral disc degeneration, lumbar region: Secondary | ICD-10-CM | POA: Diagnosis not present

## 2016-04-12 DIAGNOSIS — M7542 Impingement syndrome of left shoulder: Secondary | ICD-10-CM

## 2016-04-12 DIAGNOSIS — M5441 Lumbago with sciatica, right side: Secondary | ICD-10-CM | POA: Diagnosis not present

## 2016-04-12 DIAGNOSIS — M50322 Other cervical disc degeneration at C5-C6 level: Secondary | ICD-10-CM | POA: Diagnosis not present

## 2016-04-17 DIAGNOSIS — Z7901 Long term (current) use of anticoagulants: Secondary | ICD-10-CM | POA: Diagnosis not present

## 2016-04-17 DIAGNOSIS — I4891 Unspecified atrial fibrillation: Secondary | ICD-10-CM | POA: Diagnosis not present

## 2016-04-20 ENCOUNTER — Inpatient Hospital Stay: Admission: RE | Admit: 2016-04-20 | Payer: Self-pay | Source: Ambulatory Visit

## 2016-04-24 ENCOUNTER — Inpatient Hospital Stay: Admission: RE | Admit: 2016-04-24 | Payer: Self-pay | Source: Ambulatory Visit

## 2016-04-26 ENCOUNTER — Other Ambulatory Visit: Payer: Self-pay

## 2016-04-27 ENCOUNTER — Other Ambulatory Visit: Payer: Self-pay

## 2016-04-27 DIAGNOSIS — Z23 Encounter for immunization: Secondary | ICD-10-CM | POA: Diagnosis not present

## 2016-04-27 DIAGNOSIS — I4891 Unspecified atrial fibrillation: Secondary | ICD-10-CM | POA: Diagnosis not present

## 2016-04-27 DIAGNOSIS — Z7901 Long term (current) use of anticoagulants: Secondary | ICD-10-CM | POA: Diagnosis not present

## 2016-05-11 ENCOUNTER — Encounter: Payer: Self-pay | Admitting: Nurse Practitioner

## 2016-05-17 DIAGNOSIS — I2699 Other pulmonary embolism without acute cor pulmonale: Secondary | ICD-10-CM | POA: Diagnosis not present

## 2016-05-17 DIAGNOSIS — Z7901 Long term (current) use of anticoagulants: Secondary | ICD-10-CM | POA: Diagnosis not present

## 2016-05-24 DIAGNOSIS — Z7901 Long term (current) use of anticoagulants: Secondary | ICD-10-CM | POA: Diagnosis not present

## 2016-05-24 DIAGNOSIS — I2699 Other pulmonary embolism without acute cor pulmonale: Secondary | ICD-10-CM | POA: Diagnosis not present

## 2016-05-29 NOTE — Progress Notes (Deleted)
Electrophysiology Office Note Date: 05/29/2016  ID:  Diamond, [name] 1946/04/25, MRN [id number]  PCP: Diamond Blamer, MD Primary Cardiologist: [name] Electrophysiologist: Allred  CC: Routine ICD follow-up  [name] is a 70 y.o. female seen today for Dr [name].  She has not been compliant with remote transmissions or ICM clinic follow up since last seen.  Since last being seen in our clinic, the patient reports doing very well. She denies chest pain, palpitations, dyspnea, PND, orthopnea, nausea, vomiting, dizziness, syncope, edema, weight gain, or early satiety.  She has not had ICD shocks.   Device History: MDT CRTD implanted 2014 for ICM, CHF History of appropriate therapy: No History of AAD therapy: No   Past Medical History:  Diagnosis Date  . Asthma   . Atrial fibrillation (HCC)   . Chronic anticoagulation   . Chronic renal insufficiency   . Chronic systolic dysfunction of left ventricle   . Coronary artery disease 2002   s/p PCI of RCA  . Depression   . Diabetes mellitus   . Fibromyalgia   . Hyperlipidemia   . Hypertension   . Ischemic dilated cardiomyopathy (HCC)    s/p BiV AICD - EF now 49%  . LBBB (left bundle branch block)   . Lupus   . Obesity   . Psoriasis   . Pulmonary embolism (HCC)    previously on coumadin  . RA (rheumatoid arthritis) (HCC)   . Sjogren's syndrome (HCC)   . Stroke [facility])    TIA   Past Surgical History:  Procedure Laterality Date  . ANGIOPLASTY  2002  . BIV PACEMAKER GENERATOR CHANGE OUT  03/18/2013   MDT Viva XR BIV ICD generator change by Dr [name]  . biventricular defibrillator implantation  08/12/07   MDT Concerto implanted by Dr Amil Amen; generator change 03-18-2013 by Dr [name]  . hysterectomy (otheR)    . IMPLANTABLE CARDIOVERTER DEFIBRILLATOR GENERATOR CHANGE N/A 03/18/2013   Procedure: IMPLANTABLE CARDIOVERTER DEFIBRILLATOR GENERATOR CHANGE;  Surgeon: Diamond Range, MD;  Location: [facility] CATH LAB;  Service:  Cardiovascular;  Laterality: N/A;  . MASTECTOMY  1982    Current Outpatient Prescriptions  Medication Sig Dispense Refill  . atorvastatin (LIPITOR) 80 MG tablet Take 1 tablet (80 mg total) by mouth daily. 30 tablet 6  . buPROPion (WELLBUTRIN) 100 MG tablet Take 100 mg by mouth 2 (two) times daily.  1  . carisoprodol (SOMA) 350 MG tablet Take 350 mg by mouth daily as needed for muscle spasms (pain).     . carvedilol (COREG) 3.125 MG tablet TAKE 1 TABLET BY MOUTH TWICE DAILY 180 tablet 0  . Cholecalciferol (VITAMIN D) 2000 UNITS tablet Take 2,000 Units by mouth every evening.    . diclofenac sodium (VOLTAREN) 1 % GEL Apply 1 application topically daily as needed (pain).    Marland Kitchen diltiazem (TIAZAC) 240 MG 24 hr capsule Take 1 capsule (240 mg total) by mouth every evening. 30 capsule 11  . diphenhydrAMINE (BENADRYL) 25 MG tablet Take 25 mg by mouth daily as needed for allergies.    . DULoxetine (CYMBALTA) 60 MG capsule Take 60 mg by mouth 2 (two) times daily.     Marland Kitchen ezetimibe (ZETIA) 10 MG tablet Take 10 mg by mouth daily.    . furosemide (LASIX) 40 MG tablet Take 1 tablet (40 mg total) by mouth 2 (two) times daily. 60 tablet 3  . hydrALAZINE (APRESOLINE) 10 MG tablet Take 1 tablet (10 mg total) by mouth 4 (four) times  daily. 120 tablet 0  . hyoscyamine (LEVSIN SL) 0.125 MG SL tablet Place 1 tablet under the tongue daily as needed.  0  . isosorbide dinitrate (ISORDIL) 30 MG tablet Take 15 mg by mouth daily.    Marland Kitchen LANTUS 100 UNIT/ML injection Inject 36 Units into the skin at bedtime.   2  . linagliptin (TRADJENTA) 5 MG TABS tablet Take 5 mg by mouth daily.    Marland Kitchen LORazepam (ATIVAN) 1 MG tablet Take 1 mg by mouth 3 (three) times daily as needed for anxiety (sleep).     Marland Kitchen oxyCODONE-acetaminophen (PERCOCET/ROXICET) 5-325 MG per tablet Take 1 tablet by mouth every 6 (six) hours as needed (pain).     . ranitidine (ZANTAC) 300 MG capsule Take 300 mg by mouth 2 (two) times daily.    Marland Kitchen rOPINIRole (REQUIP) 1 MG  tablet Take 1 mg by mouth at bedtime as needed (sleep/restless legs).     Marland Kitchen spironolactone (ALDACTONE) 25 MG tablet Take 1 tablet (25 mg total) by mouth daily. Patient is overdue for an appt. Please call and schedule for further refills 15 tablet 0  . SYNTHROID 150 MCG tablet Take 150 mg by mouth daily before breakfast.    . SYNTHROID 175 MCG tablet Take 175 mg by mouth daily before breakfast.     . tiZANidine (ZANAFLEX) 4 MG tablet Take 4 mg by mouth every 8 (eight) hours as needed. For muscle spasms  5  . warfarin (COUMADIN) 10 MG tablet Take 10 mg by mouth daily at 6 PM.     No current facility-administered medications for this visit.     Allergies:   Contrast media [iodinated diagnostic agents]; Levothyroxine; Latex; Penicillins; and Sulfonamide derivatives   Social History: Social History   Social History  . Marital status: Divorced    Spouse name: N/A  . Number of children: N/A  . Years of education: N/A   Occupational History  . Not on file.   Social History Main Topics  . Smoking status: Former Games developer  . Smokeless tobacco: Never Used  . Alcohol use No  . Drug use: No  . Sexual activity: Not on file   Other Topics Concern  . 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 Heart disease Father   . Heart attack Father     Review of Systems: All other systems reviewed and are otherwise negative except as noted above.   Physical Exam: VS:  There were no vitals taken for this visit. , BMI There is no height or weight on file to calculate BMI.  GEN- The patient is well appearing, alert and oriented x 3 today.   HEENT: normocephalic, atraumatic; sclera clear, conjunctiva pink; hearing intact; oropharynx clear; neck supple, no JVP Lymph- no cervical lymphadenopathy Lungs- Clear to ausculation bilaterally, normal work of breathing.  No  wheezes, rales, rhonchi Heart- Regular rate and rhythm, no murmurs, rubs or gallops, PMI not laterally displaced GI- soft, non-tender, non-distended, bowel sounds present, no hepatosplenomegaly Extremities- no clubbing, cyanosis, or edema; DP/PT/radial pulses 2+ bilaterally MS- no significant deformity or atrophy Skin- warm and dry, no rash or lesion; ICD pocket well healed Psych- euthymic mood, full affect Neuro- strength and sensation are intact  ICD interrogation- reviewed in detail today,  See PACEART report  EKG:  EKG is ordered today. The ekg ordered  today shows ***  Recent Labs: 09/02/2015: Brain Natriuretic Peptide 25.0 11/04/2015: BUN 19; Creatinine, Ser 1.49; Hemoglobin 12.3; Platelets 167; Potassium 3.4; Sodium 141   Wt Readings from Last 3 Encounters:  05/18/15 224 lb 12.8 oz (102 kg)  11/11/14 221 lb (100.2 kg)  10/29/14 226 lb 9.6 oz (102.8 kg)     Other studies Reviewed: Additional studies/ records that were reviewed today include: Dr Jenel Lucks office notes  Assessment and Plan:  1.  Chronic systolic dysfunction euvolemic today Stable on an appropriate medical regimen Normal ICD function See Pace Art report No changes today BMET today  2.  Paroxysmal atrial fibrillation Burden by device interrogation ***% V rates controlled Continue Warfarin for CHADS2VASC of 8  3.  CAD  No recent ischemic symptoms Continue medical therapy  4. HTN Stable No change required today  5.  Non compliance Importance of compliance with remote monitoring and medications discussed at length with patient today   Current medicines are reviewed at length with the patient today.   The patient {ACTIONS; HAS/DOES NOT HAVE:19233} concerns regarding her medicines.  The following changes were made today:  {NONE DEFAULTED:18576::"none"}  Labs/ tests ordered today include: BMET No orders of the defined types were placed in this encounter.    Disposition:   Follow up with Dr [name]  3 months, [facility], me in 6 months    Signed, Gypsy Balsam, NP 05/29/2016 7:45 AM  [facility] [address] [place] [address] [phone] (office) [phone] (fax)

## 2016-05-30 ENCOUNTER — Encounter: Payer: Commercial Managed Care - HMO | Admitting: Nurse Practitioner

## 2016-06-04 DIAGNOSIS — Z7901 Long term (current) use of anticoagulants: Secondary | ICD-10-CM | POA: Diagnosis not present

## 2016-06-19 DIAGNOSIS — Z7901 Long term (current) use of anticoagulants: Secondary | ICD-10-CM | POA: Diagnosis not present

## 2016-06-19 DIAGNOSIS — I2699 Other pulmonary embolism without acute cor pulmonale: Secondary | ICD-10-CM | POA: Diagnosis not present

## 2016-07-01 IMAGING — CR DG CHEST 2V
2 series · 2 of 2 positions shown · non-contrast
Comparison: 03/04/2014

CLINICAL DATA: Chest pain for 3 days, asthma, diabetes mellitus,
hypertension, pacemaker, prior pulmonary embolism, former smoker,
coronary artery disease, fibromyalgia

EXAM:
CHEST  2 VIEW

[chest pa]
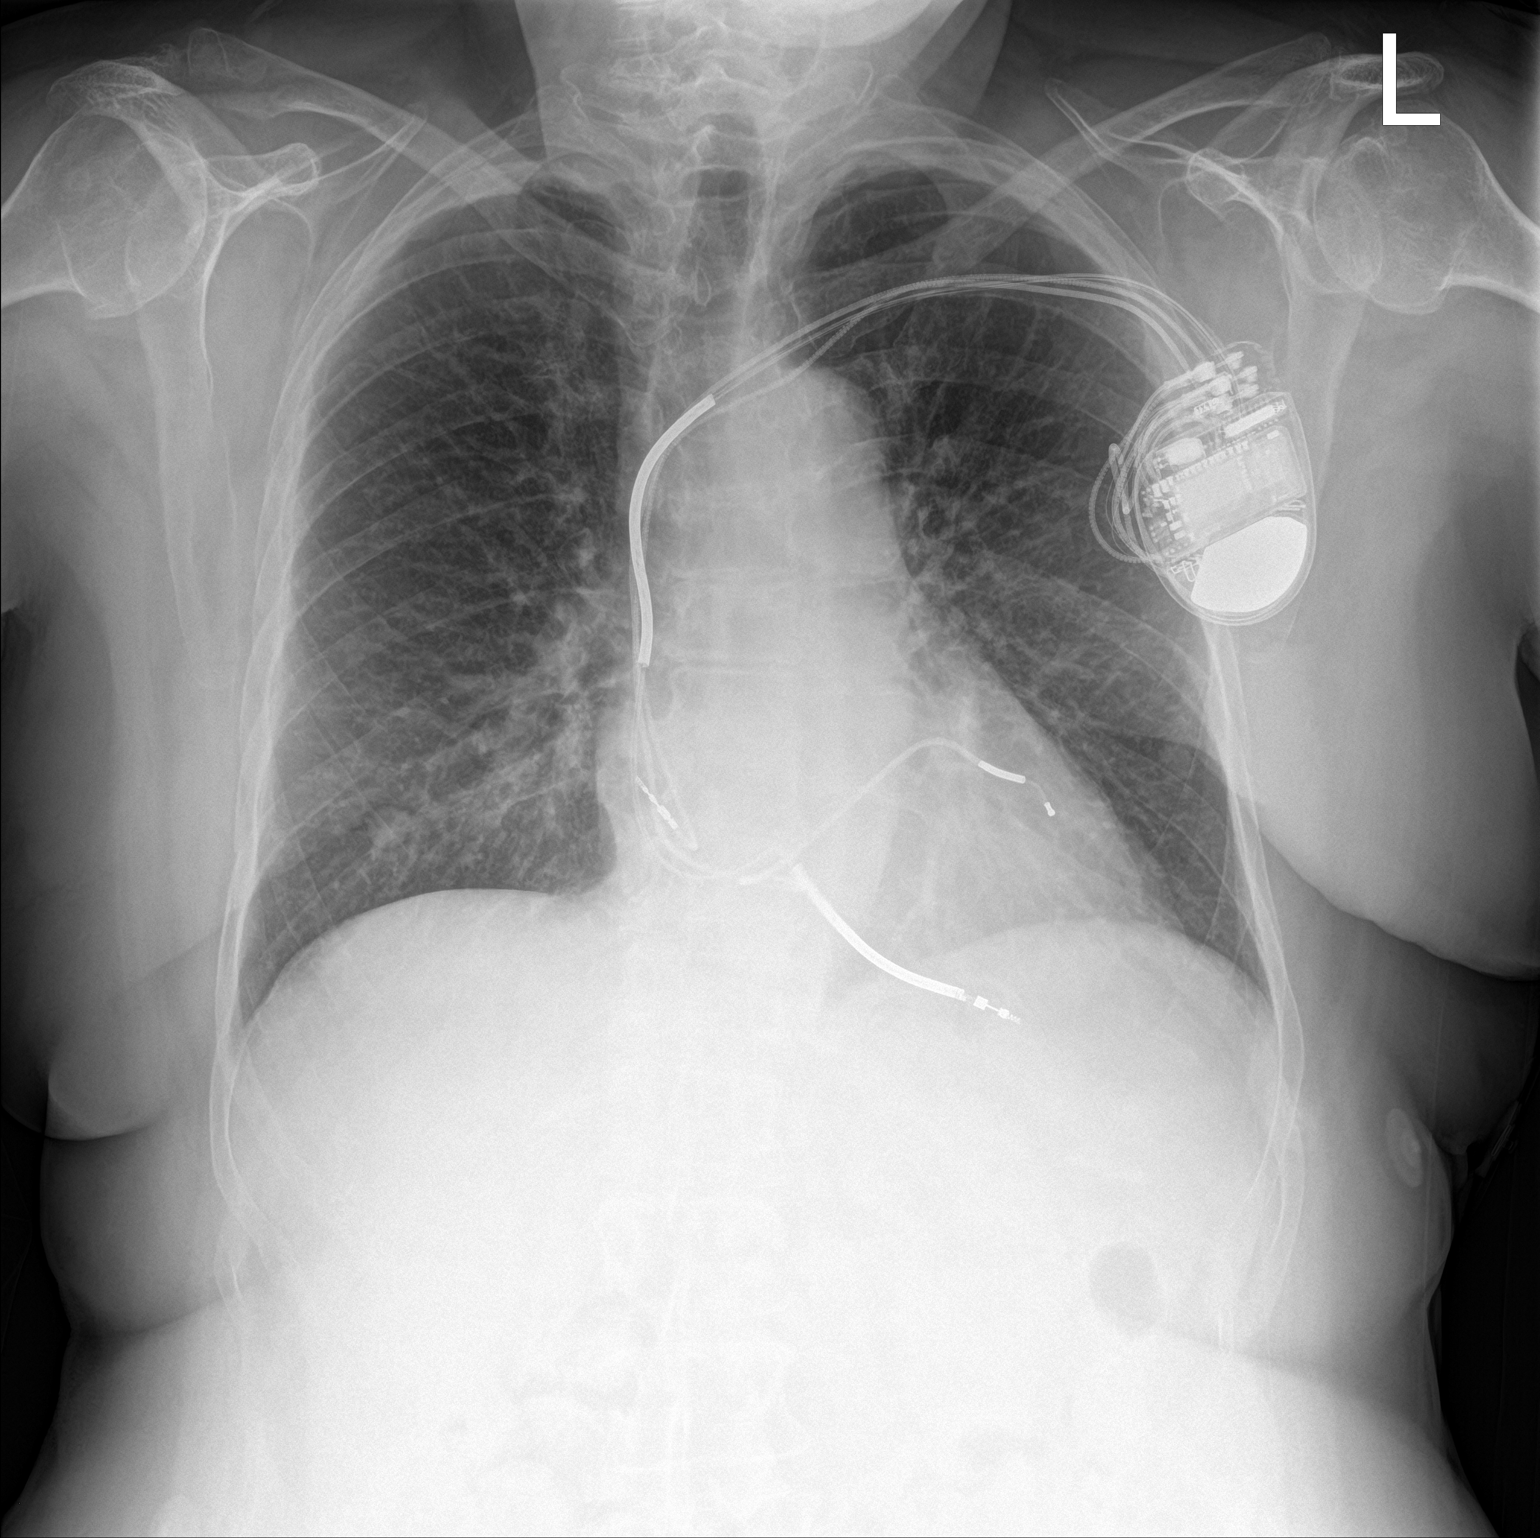

[chest lat]
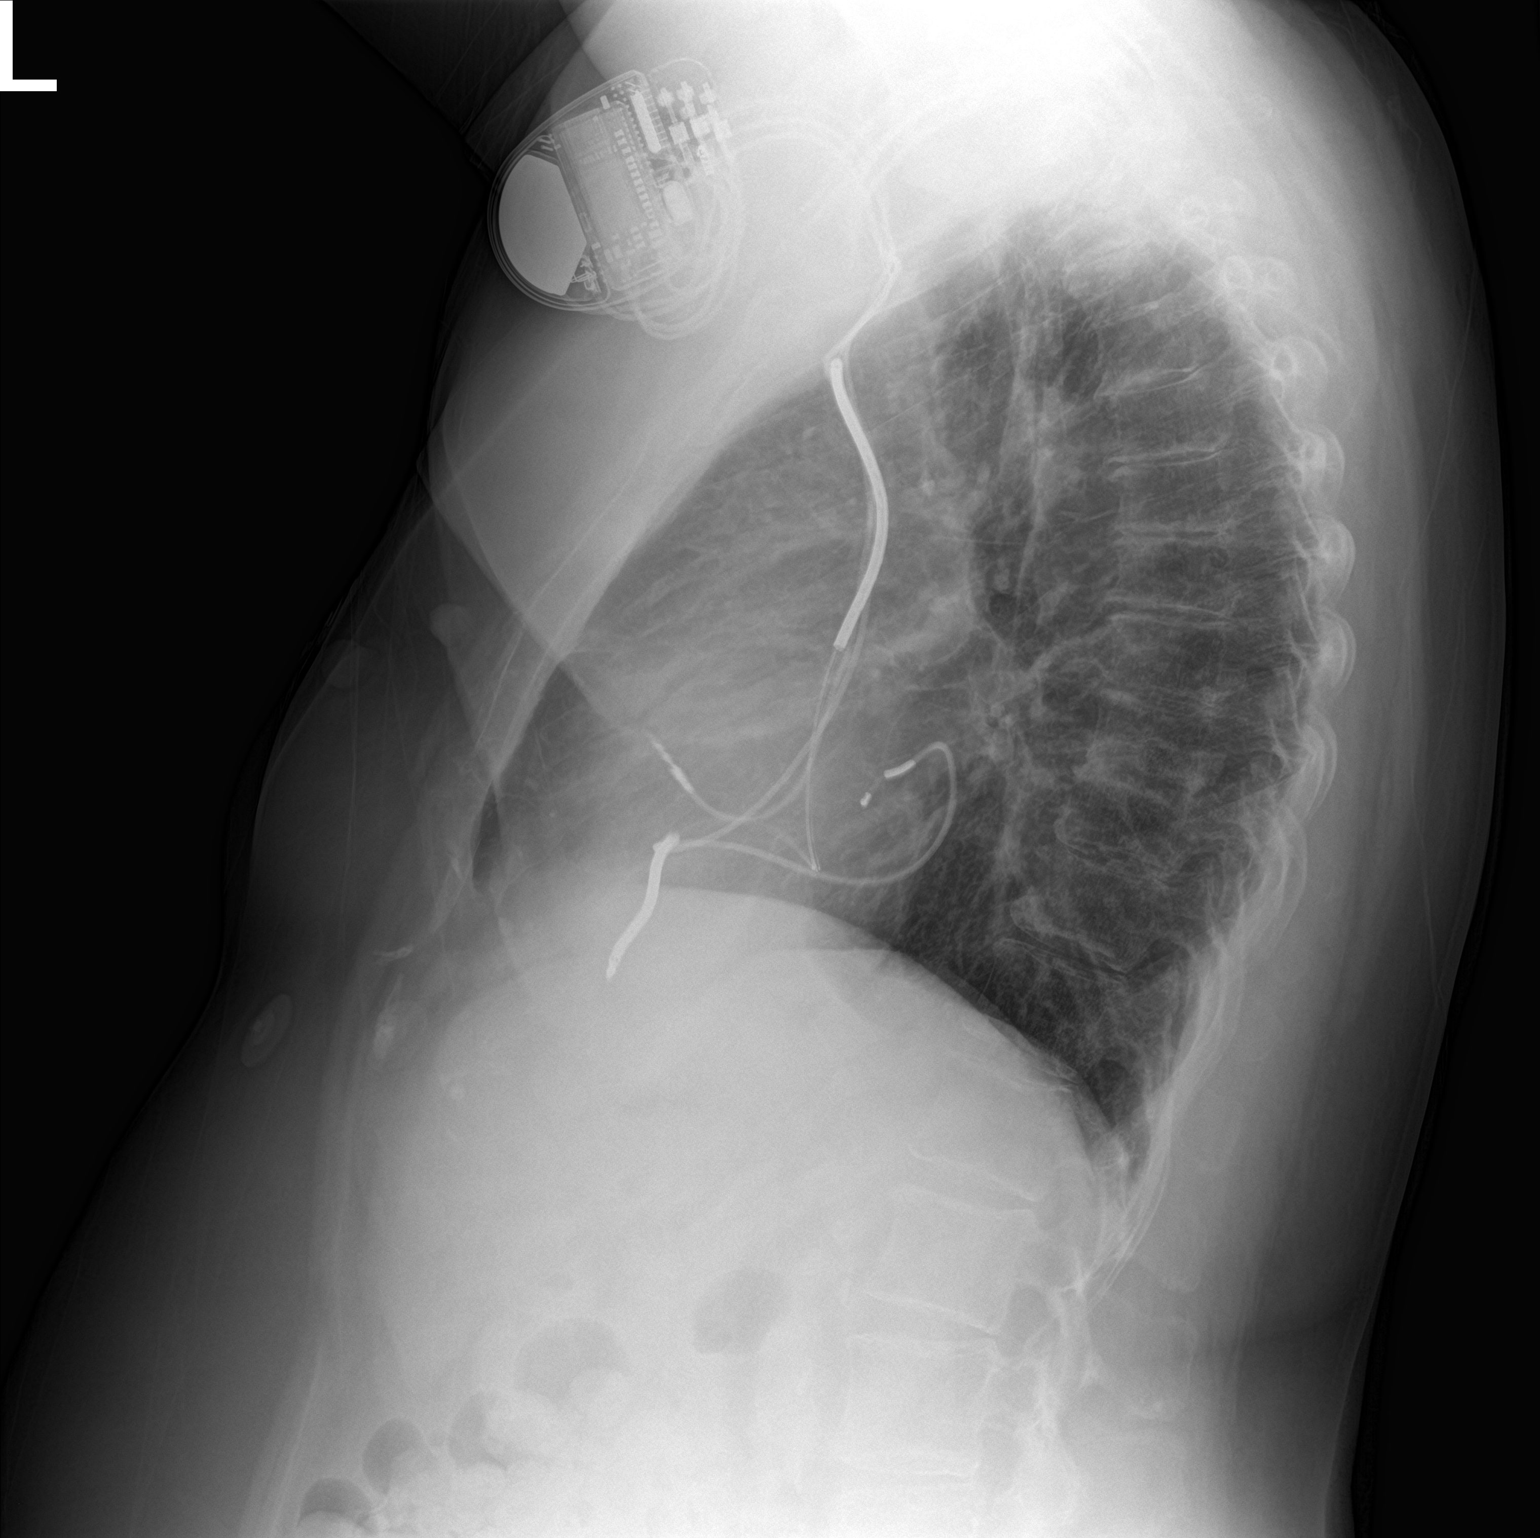

[2 of 2 positions shown; findings below may reference images not displayed]

FINDINGS: LEFT subclavian transvenous pacemaker/ AICD leads project at RIGHT
atrium, RIGHT ventricle, and coronary sinus.

Minimal enlargement of cardiac silhouette.

Mediastinal contours and pulmonary vascularity normal.

BILATERAL nipple shadows, corresponding to nipple markers on
previous exam.

No definite acute infiltrate, pleural effusion or pneumothorax.

Bones demineralized.
IMPRESSION: Enlargement of cardiac silhouette post AICD.

No acute abnormalities.

## 2016-07-10 DIAGNOSIS — I2699 Other pulmonary embolism without acute cor pulmonale: Secondary | ICD-10-CM | POA: Diagnosis not present

## 2016-07-10 DIAGNOSIS — Z7901 Long term (current) use of anticoagulants: Secondary | ICD-10-CM | POA: Diagnosis not present

## 2016-07-24 NOTE — Progress Notes (Deleted)
Electrophysiology Office Note Date: 07/24/2016  ID:  [name], DOB 1946/08/16, MRN [id number]  PCP: Johny Blamer, MD Primary Cardiologist: Mayford Knife Electrophysiologist: Allred  CC: Routine ICD follow-up  [name] is a 70 y.o. female seen today for [name].  She presents today for routine electrophysiology followup.  Since last being seen in our clinic, the patient reports doing very well. She denies chest pain, palpitations, dyspnea, PND, orthopnea, nausea, vomiting, dizziness, syncope, edema, weight gain, or early satiety.  She has not had ICD shocks.   Device History: MDT CRTD implanted 2008 for ICM, CHF; gen change 2014 History of appropriate therapy: No History of AAD therapy: No   Past Medical History:  Diagnosis Date  . Asthma   . Atrial fibrillation (HCC)   . Chronic anticoagulation   . Chronic renal insufficiency   . Chronic systolic dysfunction of left ventricle   . Coronary artery disease 2002   s/p PCI of RCA  . Depression   . Diabetes mellitus   . Fibromyalgia   . Hyperlipidemia   . Hypertension   . Ischemic dilated cardiomyopathy (HCC)    s/p BiV AICD - EF now 49%  . LBBB (left bundle branch block)   . Lupus   . Obesity   . Psoriasis   . Pulmonary embolism (HCC)    previously on coumadin  . RA (rheumatoid arthritis) (HCC)   . Sjogren's syndrome (HCC)   . Stroke [facility])    TIA   Past Surgical History:  Procedure Laterality Date  . ANGIOPLASTY  2002  . BIV PACEMAKER GENERATOR CHANGE OUT  03/18/2013   MDT Viva XR BIV ICD generator change by [name]  . biventricular defibrillator implantation  08/12/07   MDT Concerto implanted by Dr Amil Amen; generator change 03-18-2013 by [name]  . hysterectomy (otheR)    . IMPLANTABLE CARDIOVERTER DEFIBRILLATOR GENERATOR CHANGE N/A 03/18/2013   Procedure: IMPLANTABLE CARDIOVERTER DEFIBRILLATOR GENERATOR CHANGE;  Surgeon: Hillis Range, MD;  Location: [facility] CATH LAB;  Service: Cardiovascular;  Laterality:  N/A;  . MASTECTOMY  1982    Current Outpatient Prescriptions  Medication Sig Dispense Refill  . atorvastatin (LIPITOR) 80 MG tablet Take 1 tablet (80 mg total) by mouth daily. 30 tablet 6  . buPROPion (WELLBUTRIN) 100 MG tablet Take 100 mg by mouth 2 (two) times daily.  1  . carisoprodol (SOMA) 350 MG tablet Take 350 mg by mouth daily as needed for muscle spasms (pain).     . carvedilol (COREG) 3.125 MG tablet TAKE 1 TABLET BY MOUTH TWICE DAILY 180 tablet 0  . Cholecalciferol (VITAMIN D) 2000 UNITS tablet Take 2,000 Units by mouth every evening.    . diclofenac sodium (VOLTAREN) 1 % GEL Apply 1 application topically daily as needed (pain).    Marland Kitchen diltiazem (TIAZAC) 240 MG 24 hr capsule Take 1 capsule (240 mg total) by mouth every evening. 30 capsule 11  . diphenhydrAMINE (BENADRYL) 25 MG tablet Take 25 mg by mouth daily as needed for allergies.    . DULoxetine (CYMBALTA) 60 MG capsule Take 60 mg by mouth 2 (two) times daily.     Marland Kitchen ezetimibe (ZETIA) 10 MG tablet Take 10 mg by mouth daily.    . furosemide (LASIX) 40 MG tablet Take 1 tablet (40 mg total) by mouth 2 (two) times daily. 60 tablet 3  . hydrALAZINE (APRESOLINE) 10 MG tablet Take 1 tablet (10 mg total) by mouth 4 (four) times daily. 120 tablet 0  .  hyoscyamine (LEVSIN SL) 0.125 MG SL tablet Place 1 tablet under the tongue daily as needed.  0  . isosorbide dinitrate (ISORDIL) 30 MG tablet Take 15 mg by mouth daily.    Marland Kitchen LANTUS 100 UNIT/ML injection Inject 36 Units into the skin at bedtime.   2  . linagliptin (TRADJENTA) 5 MG TABS tablet Take 5 mg by mouth daily.    Marland Kitchen LORazepam (ATIVAN) 1 MG tablet Take 1 mg by mouth 3 (three) times daily as needed for anxiety (sleep).     Marland Kitchen oxyCODONE-acetaminophen (PERCOCET/ROXICET) 5-325 MG per tablet Take 1 tablet by mouth every 6 (six) hours as needed (pain).     . ranitidine (ZANTAC) 300 MG capsule Take 300 mg by mouth 2 (two) times daily.    Marland Kitchen rOPINIRole (REQUIP) 1 MG tablet Take 1 mg by mouth at  bedtime as needed (sleep/restless legs).     Marland Kitchen spironolactone (ALDACTONE) 25 MG tablet Take 1 tablet (25 mg total) by mouth daily. Patient is overdue for an appt. Please call and schedule for further refills 15 tablet 0  . SYNTHROID 150 MCG tablet Take 150 mg by mouth daily before breakfast.    . SYNTHROID 175 MCG tablet Take 175 mg by mouth daily before breakfast.     . tiZANidine (ZANAFLEX) 4 MG tablet Take 4 mg by mouth every 8 (eight) hours as needed. For muscle spasms  5  . warfarin (COUMADIN) 10 MG tablet Take 10 mg by mouth daily at 6 PM.     No current facility-administered medications for this visit.     Allergies:   Contrast media [iodinated diagnostic agents]; Levothyroxine; Latex; Penicillins; and Sulfonamide derivatives   Social History: Social History   Social History  . Marital status: Divorced    Spouse name: N/A  . Number of children: N/A  . Years of education: N/A   Occupational History  . Not on file.   Social History Main Topics  . Smoking status: Former Games developer  . Smokeless tobacco: Never Used  . Alcohol use No  . Drug use: No  . Sexual activity: Not on file   Other Topics Concern  . 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 Heart disease Father   . Heart attack Father     Review of Systems: All other systems reviewed and are otherwise negative except as noted above.   Physical Exam: VS:  There were no vitals taken for this visit. , BMI There is no height or weight on file to calculate BMI.  GEN- The patient is well appearing, alert and oriented x 3 today.   HEENT: normocephalic, atraumatic; sclera clear, conjunctiva pink; hearing intact; oropharynx clear; neck supple, no JVP Lymph- no cervical lymphadenopathy Lungs- Clear to ausculation bilaterally, normal work of breathing.  No wheezes, rales,  rhonchi Heart- Regular rate and rhythm, no murmurs, rubs or gallops, PMI not laterally displaced GI- soft, non-tender, non-distended, bowel sounds present, no hepatosplenomegaly Extremities- no clubbing, cyanosis, or edema; DP/PT/radial pulses 2+ bilaterally MS- no significant deformity or atrophy Skin- warm and dry, no rash or lesion; ICD pocket well healed Psych- euthymic mood, full affect Neuro- strength and sensation are intact  ICD interrogation- reviewed in detail today,  See PACEART report  EKG:  EKG is ordered today. The ekg ordered today shows ***  Recent Labs:  09/02/2015: Brain Natriuretic Peptide 25.0 11/04/2015: BUN 19; Creatinine, Ser 1.49; Hemoglobin 12.3; Platelets 167; Potassium 3.4; Sodium 141   Wt Readings from Last 3 Encounters:  05/18/15 224 lb 12.8 oz (102 kg)  11/11/14 221 lb (100.2 kg)  10/29/14 226 lb 9.6 oz (102.8 kg)     Other studies Reviewed: Additional studies/ records that were reviewed today include: [name] and Dr Norris Cross office notes  Assessment and Plan:  1.  Chronic systolic dysfunction EF normalized by echo 2012 post CRT implant euvolemic today Stable on an appropriate medical regimen Normal ICD function See Pace Art report No changes today Continue follow up in ICM clinic BMET, BNP today   2.  Paroxysmal atrial fibrillation Burden by device interrogation ***% V rates controlled Continue Warfarin for CHADS2VASC of 7 CBC today   3.  Hypertension Stable No change required today    Current medicines are reviewed at length with the patient today.   The patient does not have concerns regarding her medicines.  The following changes were made today:  none  Labs/ tests ordered today include: BMET, BNP, CBC No orders of the defined types were placed in this encounter.    Disposition:   Follow up with ICM clinic, [facility], Dr Mayford Knife 6 months, [name] 1 year     Signed, Gypsy Balsam, NP 07/24/2016 10:24 AM  [facility]  [facility] [address] [place] [address] [phone] (office) [phone] (fax)

## 2016-07-25 ENCOUNTER — Encounter: Payer: Commercial Managed Care - HMO | Admitting: Nurse Practitioner

## 2016-08-07 DIAGNOSIS — I2699 Other pulmonary embolism without acute cor pulmonale: Secondary | ICD-10-CM | POA: Diagnosis not present

## 2016-08-07 DIAGNOSIS — E039 Hypothyroidism, unspecified: Secondary | ICD-10-CM | POA: Diagnosis not present

## 2016-08-07 DIAGNOSIS — Z7901 Long term (current) use of anticoagulants: Secondary | ICD-10-CM | POA: Diagnosis not present

## 2016-08-09 DIAGNOSIS — G8929 Other chronic pain: Secondary | ICD-10-CM | POA: Diagnosis not present

## 2016-08-09 DIAGNOSIS — M50322 Other cervical disc degeneration at C5-C6 level: Secondary | ICD-10-CM | POA: Diagnosis not present

## 2016-08-09 DIAGNOSIS — M7542 Impingement syndrome of left shoulder: Secondary | ICD-10-CM | POA: Diagnosis not present

## 2016-08-09 DIAGNOSIS — M5441 Lumbago with sciatica, right side: Secondary | ICD-10-CM | POA: Diagnosis not present

## 2016-08-09 DIAGNOSIS — M5136 Other intervertebral disc degeneration, lumbar region: Secondary | ICD-10-CM | POA: Diagnosis not present

## 2016-08-22 NOTE — Progress Notes (Signed)
Electrophysiology Office Note Date: 08/24/2016  ID:  [name], DOB 1946-06-08, MRN [id number]  PCP: Johny Blamer, MD Primary Cardiologist: Mayford Knife Electrophysiologist: Allred  CC: Routine ICD follow-up  [name] is a 71 y.o. female seen today for [name].  She presents today for routine electrophysiology followup.  Since last being seen in our clinic, the patient reports doing reasonably well.  She has chronic shortness of breath with exertion but denies chest pain, palpitations, PND, orthopnea, nausea, vomiting, dizziness, syncope, edema, weight gain, or early satiety.  She has not had ICD shocks. She reports compliance with medical therapy.   Device History: MDT CRTD implanted 2008 for ICM, CHF; gen change 2014 History of appropriate therapy: No History of AAD therapy: No   Past Medical History:  Diagnosis Date  . Asthma   . Atrial fibrillation (HCC)   . Chronic renal insufficiency   . Chronic systolic dysfunction of left ventricle   . Coronary artery disease 2002   s/p PCI of RCA  . Depression   . Diabetes mellitus   . Fibromyalgia   . Hyperlipidemia   . Hypertension   . Ischemic dilated cardiomyopathy (HCC)    s/p BiV AICD - EF now 49%  . LBBB (left bundle branch block)   . Lupus   . Obesity   . Psoriasis   . Pulmonary embolism (HCC)    previously on coumadin  . RA (rheumatoid arthritis) (HCC)   . Sjogren's syndrome (HCC)   . Stroke [facility])    TIA   Past Surgical History:  Procedure Laterality Date  . ANGIOPLASTY  2002  . biventricular defibrillator implantation  08/12/07   MDT Concerto implanted by Dr Amil Amen; generator change 03-18-2013 by [name]  . hysterectomy (otheR)    . IMPLANTABLE CARDIOVERTER DEFIBRILLATOR GENERATOR CHANGE N/A 03/18/2013   MDT Viva XR BIV ICD generator change by [name]  . MASTECTOMY  1982    Current Outpatient Prescriptions  Medication Sig Dispense Refill  . atorvastatin (LIPITOR) 80 MG tablet Take 1 tablet  (80 mg total) by mouth daily. 30 tablet 6  . buPROPion (WELLBUTRIN) 100 MG tablet Take 100 mg by mouth 2 (two) times daily.  1  . carisoprodol (SOMA) 350 MG tablet Take 350 mg by mouth daily as needed for muscle spasms (pain).     . carvedilol (COREG) 3.125 MG tablet TAKE 1 TABLET BY MOUTH TWICE DAILY 180 tablet 0  . Cholecalciferol (VITAMIN D) 2000 UNITS tablet Take 2,000 Units by mouth every evening.    . diclofenac sodium (VOLTAREN) 1 % GEL Apply 1 application topically daily as needed (pain).    Marland Kitchen diltiazem (TIAZAC) 240 MG 24 hr capsule Take 1 capsule (240 mg total) by mouth every evening. 30 capsule 11  . diphenhydrAMINE (BENADRYL) 25 MG tablet Take 25 mg by mouth daily as needed for allergies.    . DULoxetine (CYMBALTA) 60 MG capsule Take 60 mg by mouth 2 (two) times daily.     Marland Kitchen ezetimibe (ZETIA) 10 MG tablet Take 10 mg by mouth daily.    . furosemide (LASIX) 40 MG tablet Take 1 tablet (40 mg total) by mouth 2 (two) times daily. 60 tablet 3  . hydrALAZINE (APRESOLINE) 10 MG tablet Take 1 tablet (10 mg total) by mouth 4 (four) times daily. 120 tablet 0  . hyoscyamine (LEVSIN SL) 0.125 MG SL tablet Place 1 tablet under the tongue daily as needed.  0  . isosorbide dinitrate (ISORDIL) 30  MG tablet Take 15 mg by mouth daily.    Marland Kitchen LANTUS 100 UNIT/ML injection Inject 36 Units into the skin at bedtime.   2  . linagliptin (TRADJENTA) 5 MG TABS tablet Take 5 mg by mouth daily.    Marland Kitchen LORazepam (ATIVAN) 1 MG tablet Take 1 mg by mouth 3 (three) times daily as needed for anxiety (sleep).     Marland Kitchen oxyCODONE-acetaminophen (PERCOCET/ROXICET) 5-325 MG per tablet Take 1 tablet by mouth every 6 (six) hours as needed (pain).     . ranitidine (ZANTAC) 300 MG capsule Take 300 mg by mouth 2 (two) times daily.    Marland Kitchen rOPINIRole (REQUIP) 1 MG tablet Take 1 mg by mouth at bedtime as needed (sleep/restless legs).     Marland Kitchen spironolactone (ALDACTONE) 25 MG tablet Take 1 tablet (25 mg total) by mouth daily. Patient is overdue  for an appt. Please call and schedule for further refills 15 tablet 0  . SYNTHROID 150 MCG tablet Take 150 mg by mouth daily before breakfast.    . SYNTHROID 175 MCG tablet Take 175 mg by mouth daily before breakfast.     . tiZANidine (ZANAFLEX) 4 MG tablet Take 4 mg by mouth every 8 (eight) hours as needed. For muscle spasms  5  . warfarin (COUMADIN) 10 MG tablet Take 10 mg by mouth daily at 6 PM.     No current facility-administered medications for this visit.     Allergies:   Contrast media [iodinated diagnostic agents]; Levothyroxine; Latex; Penicillins; and Sulfonamide derivatives   Social History: Social History   Social History  . Marital status: Divorced    Spouse name: N/A  . Number of children: N/A  . Years of education: N/A   Occupational History  . Not on file.   Social History Main Topics  . Smoking status: Former Games developer  . Smokeless tobacco: Never Used  . Alcohol use No  . Drug use: No  . Sexual activity: Not on file   Other Topics Concern  . 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 Heart disease Father   . Heart attack Father     Review of Systems: All other systems reviewed and are otherwise negative except as noted above.   Physical Exam: VS:  BP 140/70   Pulse (!) 105   Ht 5\' 6"  (1.676 m)   Wt 226 lb 9.6 oz (102.8 kg)   LMP  (LMP Unknown)   BMI 36.57 kg/m  , BMI Body mass index is 36.57 kg/m.  GEN- The patient is obese appearing, alert and oriented x 3 today.   HEENT: normocephalic, atraumatic; sclera clear, conjunctiva pink; hearing intact; oropharynx clear; neck supple  Lungs- Clear to ausculation bilaterally, normal work of breathing.  No wheezes, rales, rhonchi Heart- Regular rate and rhythm (paced) GI- soft, non-tender, non-distended, bowel sounds present  Extremities- no clubbing, cyanosis, or  edema  MS- no significant deformity or atrophy Skin- warm and dry, no rash or lesion; ICD pocket well healed Psych- euthymic mood, full affect Neuro- strength and sensation are intact  ICD interrogation- reviewed in detail today,  See PACEART report  EKG:  EKG is ordered today. The ekg ordered today shows sinus rhythm with ventricular pacing   Recent Labs: 09/02/2015: Brain Natriuretic Peptide 25.0 11/04/2015: BUN 19; Creatinine, Ser 1.49; Hemoglobin 12.3; Platelets  167; Potassium 3.4; Sodium 141   Wt Readings from Last 3 Encounters:  08/23/16 226 lb 9.6 oz (102.8 kg)  05/18/15 224 lb 12.8 oz (102 kg)  11/11/14 221 lb (100.2 kg)     Other studies Reviewed: Additional studies/ records that were reviewed today include: [name] and Dr Norris Cross office notes  Assessment and Plan:  1.  Chronic systolic dysfunction euvolemic today Stable on an appropriate medical regimen Normal ICD function See Pace Art report No changes today Histograms are right shifted but stable   2.  Paroxysmal atrial fibrillation Burden by device interrogation 0% Continue Warfarin for CHADS2VASC of 8 CBC followed by PCP and checked today    3.  Hypertensive cardiovascular disease with heart failure  Stable No change required today BMET followed by PCP and checked today    Current medicines are reviewed at length with the patient today.   The patient does not have concerns regarding her medicines.  The following changes were made today:  none  Labs/ tests ordered today include: none No orders of the defined types were placed in this encounter.    Disposition:   Follow up with [facility], Dr Mayford Knife 6 months, [name] 1 year      Signed, Gypsy Balsam, NP 08/24/2016 7:11 AM  [facility] [address] [place] [address] [phone] (office) [phone] (fax)

## 2016-08-23 ENCOUNTER — Ambulatory Visit (INDEPENDENT_AMBULATORY_CARE_PROVIDER_SITE_OTHER): Payer: Commercial Managed Care - HMO | Admitting: Nurse Practitioner

## 2016-08-23 ENCOUNTER — Encounter: Payer: Self-pay | Admitting: Nurse Practitioner

## 2016-08-23 VITALS — BP 140/70 | HR 105 | Ht 66.0 in | Wt 226.6 lb

## 2016-08-23 DIAGNOSIS — I480 Paroxysmal atrial fibrillation: Secondary | ICD-10-CM

## 2016-08-23 DIAGNOSIS — E1122 Type 2 diabetes mellitus with diabetic chronic kidney disease: Secondary | ICD-10-CM | POA: Diagnosis not present

## 2016-08-23 DIAGNOSIS — Z720 Tobacco use: Secondary | ICD-10-CM | POA: Diagnosis not present

## 2016-08-23 DIAGNOSIS — I110 Hypertensive heart disease with heart failure: Secondary | ICD-10-CM | POA: Diagnosis not present

## 2016-08-23 DIAGNOSIS — E039 Hypothyroidism, unspecified: Secondary | ICD-10-CM | POA: Diagnosis not present

## 2016-08-23 DIAGNOSIS — G2581 Restless legs syndrome: Secondary | ICD-10-CM | POA: Diagnosis not present

## 2016-08-23 DIAGNOSIS — M797 Fibromyalgia: Secondary | ICD-10-CM | POA: Diagnosis not present

## 2016-08-23 DIAGNOSIS — I5022 Chronic systolic (congestive) heart failure: Secondary | ICD-10-CM | POA: Diagnosis not present

## 2016-08-23 DIAGNOSIS — I10 Essential (primary) hypertension: Secondary | ICD-10-CM | POA: Diagnosis not present

## 2016-08-23 DIAGNOSIS — E7800 Pure hypercholesterolemia, unspecified: Secondary | ICD-10-CM | POA: Diagnosis not present

## 2016-08-23 DIAGNOSIS — N183 Chronic kidney disease, stage 3 (moderate): Secondary | ICD-10-CM | POA: Diagnosis not present

## 2016-08-23 DIAGNOSIS — F324 Major depressive disorder, single episode, in partial remission: Secondary | ICD-10-CM | POA: Diagnosis not present

## 2016-08-23 NOTE — Patient Instructions (Signed)
Medication Instructions:  Your physician recommends that you continue on your current medications as directed. Please refer to the Current Medication list given to you today.   Labwork: None ordered  Testing/Procedures: None ordered  Follow-Up:  Your physician wants you to follow-up in: 6 months with Dr Mayford Knife and 12 months with [name] Bonita Quin will receive a reminder letter in the mail two months in advance. If you don't receive a letter, please call our office to schedule the follow-up appointment.   Remote monitoring is used to monitor your  ICD from home. This monitoring reduces the number of office visits required to check your device to one time per year. It allows [place] to keep an eye on the functioning of your device to ensure it is working properly. You are scheduled for a device check from home on 11/22/16 You may send your transmission at any time that day. If you have a wireless device, the transmission will be sent automatically. After your physician reviews your transmission, you will receive a postcard with your next transmission date.       If you need a refill on your cardiac medications before your next appointment, please call your pharmacy.

## 2016-08-24 LAB — CUP PACEART INCLINIC DEVICE CHECK
Implantable Lead Implant Date: 20081223
Implantable Lead Location: 753859
Implantable Lead Model: 5076
Implantable Pulse Generator Implant Date: 20140730
MDC IDC LEAD IMPLANT DT: 20081223
MDC IDC LEAD IMPLANT DT: 20081223
MDC IDC LEAD LOCATION: 753858
MDC IDC LEAD LOCATION: 753860
MDC IDC LEAD MODEL: 4194
MDC IDC SESS DTM: 20180105084032

## 2016-08-24 MED ORDER — SPIRONOLACTONE 25 MG PO TABS: 25.0000 mg | ORAL_TABLET | Freq: Every day | ORAL | 11 refills | Status: AC

## 2016-09-10 ENCOUNTER — Ambulatory Visit
Admission: RE | Admit: 2016-09-10 | Discharge: 2016-09-10 | Disposition: A | Discharge status: Home / Self Care | Payer: Medicare HMO | Source: Ambulatory Visit | Attending: Family Medicine | Admitting: Family Medicine

## 2016-09-10 ENCOUNTER — Other Ambulatory Visit: Payer: Self-pay | Admitting: Family Medicine

## 2016-09-10 DIAGNOSIS — S8011XA Contusion of right lower leg, initial encounter: Secondary | ICD-10-CM | POA: Diagnosis not present

## 2016-09-10 DIAGNOSIS — M79604 Pain in right leg: Secondary | ICD-10-CM

## 2016-09-20 DIAGNOSIS — I2699 Other pulmonary embolism without acute cor pulmonale: Secondary | ICD-10-CM | POA: Diagnosis not present

## 2016-09-20 DIAGNOSIS — Z7901 Long term (current) use of anticoagulants: Secondary | ICD-10-CM | POA: Diagnosis not present

## 2016-10-18 DIAGNOSIS — Z7901 Long term (current) use of anticoagulants: Secondary | ICD-10-CM | POA: Diagnosis not present

## 2016-10-18 DIAGNOSIS — I2699 Other pulmonary embolism without acute cor pulmonale: Secondary | ICD-10-CM | POA: Diagnosis not present

## 2016-10-23 DIAGNOSIS — M7542 Impingement syndrome of left shoulder: Secondary | ICD-10-CM | POA: Diagnosis not present

## 2016-11-09 ENCOUNTER — Telehealth: Payer: Self-pay | Admitting: Cardiology

## 2016-11-09 NOTE — Telephone Encounter (Signed)
Patient calling, would like to know if she could have a prescription for an anti-inflammatory. Patient states that she is having issues with osteoporosis. Please call to discuss, thanks.

## 2016-11-09 NOTE — Telephone Encounter (Signed)
Patient reports she does not need a prescription, she was asking if it would be ok to use one per her PCP. She will call with the name of the medication and it will be checked with [facility]. She was grateful for call.

## 2016-11-12 NOTE — Telephone Encounter (Signed)
Would advise her to limit use as much as possible. NSAIDs have a black box warning that they can increase the risk of MI and stroke in our cardiac population. May also increase her risk of bleeding since she is on warfarin.

## 2016-11-12 NOTE — Telephone Encounter (Signed)
Left message to call back  

## 2016-11-12 NOTE — Telephone Encounter (Signed)
Patient would like to know if Celebrex may be used with her cardiac meds.   To RPH.

## 2016-11-12 NOTE — Telephone Encounter (Signed)
Follow up   Pt is calling with name of medication. It's Celebrex.

## 2016-11-13 NOTE — Telephone Encounter (Signed)
Follow Up: ° ° ° °Returning your call from yesterday. °

## 2016-11-13 NOTE — Telephone Encounter (Signed)
Per DPR form, left message for patient that Celebrex should be limited as much as possible to reduce risk of bleeding, MI and stroke. Instructed her to call if she has any questions or concerns.

## 2016-11-14 NOTE — Telephone Encounter (Signed)
Reiterated to patient RPH's instructions. Patient will talk to PCP to discuss other options for treatment. She was grateful for assistance.

## 2016-11-15 DIAGNOSIS — I2699 Other pulmonary embolism without acute cor pulmonale: Secondary | ICD-10-CM | POA: Diagnosis not present

## 2016-11-15 DIAGNOSIS — Z7901 Long term (current) use of anticoagulants: Secondary | ICD-10-CM | POA: Diagnosis not present

## 2016-11-30 DIAGNOSIS — E1122 Type 2 diabetes mellitus with diabetic chronic kidney disease: Secondary | ICD-10-CM | POA: Diagnosis not present

## 2016-12-07 DIAGNOSIS — W19XXXA Unspecified fall, initial encounter: Secondary | ICD-10-CM | POA: Diagnosis not present

## 2016-12-07 DIAGNOSIS — F324 Major depressive disorder, single episode, in partial remission: Secondary | ICD-10-CM | POA: Diagnosis not present

## 2016-12-07 DIAGNOSIS — S300XXA Contusion of lower back and pelvis, initial encounter: Secondary | ICD-10-CM | POA: Diagnosis not present

## 2016-12-07 DIAGNOSIS — R5381 Other malaise: Secondary | ICD-10-CM | POA: Diagnosis not present

## 2016-12-19 DIAGNOSIS — I4891 Unspecified atrial fibrillation: Secondary | ICD-10-CM | POA: Diagnosis not present

## 2016-12-19 DIAGNOSIS — Z7901 Long term (current) use of anticoagulants: Secondary | ICD-10-CM | POA: Diagnosis not present

## 2016-12-19 DIAGNOSIS — R296 Repeated falls: Secondary | ICD-10-CM | POA: Diagnosis not present

## 2016-12-19 DIAGNOSIS — M6281 Muscle weakness (generalized): Secondary | ICD-10-CM | POA: Diagnosis not present

## 2016-12-28 DIAGNOSIS — Z7901 Long term (current) use of anticoagulants: Secondary | ICD-10-CM | POA: Diagnosis not present

## 2016-12-28 DIAGNOSIS — Z8679 Personal history of other diseases of the circulatory system: Secondary | ICD-10-CM | POA: Diagnosis not present

## 2017-01-07 DIAGNOSIS — I2699 Other pulmonary embolism without acute cor pulmonale: Secondary | ICD-10-CM | POA: Diagnosis not present

## 2017-01-07 DIAGNOSIS — Z7901 Long term (current) use of anticoagulants: Secondary | ICD-10-CM | POA: Diagnosis not present

## 2017-01-24 DIAGNOSIS — R296 Repeated falls: Secondary | ICD-10-CM | POA: Diagnosis not present

## 2017-01-24 DIAGNOSIS — M6281 Muscle weakness (generalized): Secondary | ICD-10-CM | POA: Diagnosis not present

## 2017-02-06 DIAGNOSIS — Z7901 Long term (current) use of anticoagulants: Secondary | ICD-10-CM | POA: Diagnosis not present

## 2017-02-06 DIAGNOSIS — Z8679 Personal history of other diseases of the circulatory system: Secondary | ICD-10-CM | POA: Diagnosis not present

## 2017-02-07 DIAGNOSIS — R296 Repeated falls: Secondary | ICD-10-CM | POA: Diagnosis not present

## 2017-02-07 DIAGNOSIS — M6281 Muscle weakness (generalized): Secondary | ICD-10-CM | POA: Diagnosis not present

## 2017-02-12 DIAGNOSIS — R296 Repeated falls: Secondary | ICD-10-CM | POA: Diagnosis not present

## 2017-02-12 DIAGNOSIS — M6281 Muscle weakness (generalized): Secondary | ICD-10-CM | POA: Diagnosis not present

## 2017-02-21 DIAGNOSIS — E039 Hypothyroidism, unspecified: Secondary | ICD-10-CM | POA: Diagnosis not present

## 2017-02-21 DIAGNOSIS — M797 Fibromyalgia: Secondary | ICD-10-CM | POA: Diagnosis not present

## 2017-02-21 DIAGNOSIS — Z7901 Long term (current) use of anticoagulants: Secondary | ICD-10-CM | POA: Diagnosis not present

## 2017-02-21 DIAGNOSIS — E1122 Type 2 diabetes mellitus with diabetic chronic kidney disease: Secondary | ICD-10-CM | POA: Diagnosis not present

## 2017-02-21 DIAGNOSIS — F324 Major depressive disorder, single episode, in partial remission: Secondary | ICD-10-CM | POA: Diagnosis not present

## 2017-02-21 DIAGNOSIS — E7800 Pure hypercholesterolemia, unspecified: Secondary | ICD-10-CM | POA: Diagnosis not present

## 2017-02-21 DIAGNOSIS — E1165 Type 2 diabetes mellitus with hyperglycemia: Secondary | ICD-10-CM | POA: Diagnosis not present

## 2017-02-21 DIAGNOSIS — G2581 Restless legs syndrome: Secondary | ICD-10-CM | POA: Diagnosis not present

## 2017-02-21 DIAGNOSIS — I10 Essential (primary) hypertension: Secondary | ICD-10-CM | POA: Diagnosis not present

## 2017-02-21 DIAGNOSIS — G894 Chronic pain syndrome: Secondary | ICD-10-CM | POA: Diagnosis not present

## 2017-03-07 DIAGNOSIS — J0100 Acute maxillary sinusitis, unspecified: Secondary | ICD-10-CM | POA: Diagnosis not present

## 2017-04-04 DIAGNOSIS — M7542 Impingement syndrome of left shoulder: Secondary | ICD-10-CM | POA: Diagnosis not present

## 2017-04-11 DIAGNOSIS — Z8679 Personal history of other diseases of the circulatory system: Secondary | ICD-10-CM | POA: Diagnosis not present

## 2017-04-11 DIAGNOSIS — Z7901 Long term (current) use of anticoagulants: Secondary | ICD-10-CM | POA: Diagnosis not present

## 2017-04-25 ENCOUNTER — Ambulatory Visit: Payer: Medicare HMO | Admitting: Cardiology

## 2017-05-05 ENCOUNTER — Emergency Department (HOSPITAL_COMMUNITY): Payer: Medicare HMO

## 2017-05-05 ENCOUNTER — Inpatient Hospital Stay (HOSPITAL_COMMUNITY)
Admission: EM | Admit: 2017-05-05 | Discharge: 2017-05-20 | DRG: 296 | Disposition: E | Payer: Medicare HMO | Attending: Critical Care Medicine | Admitting: Critical Care Medicine

## 2017-05-05 DIAGNOSIS — J9601 Acute respiratory failure with hypoxia: Secondary | ICD-10-CM

## 2017-05-05 DIAGNOSIS — G931 Anoxic brain damage, not elsewhere classified: Secondary | ICD-10-CM | POA: Diagnosis not present

## 2017-05-05 DIAGNOSIS — I5022 Chronic systolic (congestive) heart failure: Secondary | ICD-10-CM | POA: Diagnosis present

## 2017-05-05 DIAGNOSIS — E872 Acidosis, unspecified: Secondary | ICD-10-CM

## 2017-05-05 DIAGNOSIS — M797 Fibromyalgia: Secondary | ICD-10-CM | POA: Diagnosis present

## 2017-05-05 DIAGNOSIS — I255 Ischemic cardiomyopathy: Secondary | ICD-10-CM | POA: Diagnosis present

## 2017-05-05 DIAGNOSIS — Z888 Allergy status to other drugs, medicaments and biological substances status: Secondary | ICD-10-CM

## 2017-05-05 DIAGNOSIS — R918 Other nonspecific abnormal finding of lung field: Secondary | ICD-10-CM | POA: Diagnosis not present

## 2017-05-05 DIAGNOSIS — R402112 Coma scale, eyes open, never, at arrival to emergency department: Secondary | ICD-10-CM | POA: Diagnosis not present

## 2017-05-05 DIAGNOSIS — F329 Major depressive disorder, single episode, unspecified: Secondary | ICD-10-CM | POA: Diagnosis present

## 2017-05-05 DIAGNOSIS — I420 Dilated cardiomyopathy: Secondary | ICD-10-CM | POA: Diagnosis present

## 2017-05-05 DIAGNOSIS — I2510 Atherosclerotic heart disease of native coronary artery without angina pectoris: Secondary | ICD-10-CM | POA: Diagnosis present

## 2017-05-05 DIAGNOSIS — R0602 Shortness of breath: Secondary | ICD-10-CM | POA: Diagnosis not present

## 2017-05-05 DIAGNOSIS — R569 Unspecified convulsions: Secondary | ICD-10-CM | POA: Diagnosis not present

## 2017-05-05 DIAGNOSIS — G253 Myoclonus: Secondary | ICD-10-CM | POA: Diagnosis present

## 2017-05-05 DIAGNOSIS — I480 Paroxysmal atrial fibrillation: Secondary | ICD-10-CM | POA: Diagnosis present

## 2017-05-05 DIAGNOSIS — N179 Acute kidney failure, unspecified: Secondary | ICD-10-CM | POA: Diagnosis present

## 2017-05-05 DIAGNOSIS — R109 Unspecified abdominal pain: Secondary | ICD-10-CM | POA: Diagnosis not present

## 2017-05-05 DIAGNOSIS — E7220 Disorder of urea cycle metabolism, unspecified: Secondary | ICD-10-CM | POA: Diagnosis present

## 2017-05-05 DIAGNOSIS — N184 Chronic kidney disease, stage 4 (severe): Secondary | ICD-10-CM | POA: Diagnosis present

## 2017-05-05 DIAGNOSIS — Z66 Do not resuscitate: Secondary | ICD-10-CM | POA: Diagnosis not present

## 2017-05-05 DIAGNOSIS — Z7901 Long term (current) use of anticoagulants: Secondary | ICD-10-CM

## 2017-05-05 DIAGNOSIS — Z90710 Acquired absence of both cervix and uterus: Secondary | ICD-10-CM

## 2017-05-05 DIAGNOSIS — R402312 Coma scale, best motor response, none, at arrival to emergency department: Secondary | ICD-10-CM | POA: Diagnosis present

## 2017-05-05 DIAGNOSIS — Z880 Allergy status to penicillin: Secondary | ICD-10-CM

## 2017-05-05 DIAGNOSIS — I472 Ventricular tachycardia: Secondary | ICD-10-CM | POA: Diagnosis not present

## 2017-05-05 DIAGNOSIS — Z8673 Personal history of transient ischemic attack (TIA), and cerebral infarction without residual deficits: Secondary | ICD-10-CM

## 2017-05-05 DIAGNOSIS — Z86711 Personal history of pulmonary embolism: Secondary | ICD-10-CM

## 2017-05-05 DIAGNOSIS — I130 Hypertensive heart and chronic kidney disease with heart failure and stage 1 through stage 4 chronic kidney disease, or unspecified chronic kidney disease: Secondary | ICD-10-CM | POA: Diagnosis present

## 2017-05-05 DIAGNOSIS — M069 Rheumatoid arthritis, unspecified: Secondary | ICD-10-CM | POA: Diagnosis present

## 2017-05-05 DIAGNOSIS — R402212 Coma scale, best verbal response, none, at arrival to emergency department: Secondary | ICD-10-CM | POA: Diagnosis present

## 2017-05-05 DIAGNOSIS — Z452 Encounter for adjustment and management of vascular access device: Secondary | ICD-10-CM

## 2017-05-05 DIAGNOSIS — R0902 Hypoxemia: Secondary | ICD-10-CM | POA: Diagnosis not present

## 2017-05-05 DIAGNOSIS — L409 Psoriasis, unspecified: Secondary | ICD-10-CM | POA: Diagnosis present

## 2017-05-05 DIAGNOSIS — Z4682 Encounter for fitting and adjustment of non-vascular catheter: Secondary | ICD-10-CM | POA: Diagnosis not present

## 2017-05-05 DIAGNOSIS — I469 Cardiac arrest, cause unspecified: Secondary | ICD-10-CM | POA: Diagnosis not present

## 2017-05-05 DIAGNOSIS — Z79899 Other long term (current) drug therapy: Secondary | ICD-10-CM

## 2017-05-05 DIAGNOSIS — I447 Left bundle-branch block, unspecified: Secondary | ICD-10-CM | POA: Diagnosis present

## 2017-05-05 DIAGNOSIS — E785 Hyperlipidemia, unspecified: Secondary | ICD-10-CM | POA: Diagnosis present

## 2017-05-05 DIAGNOSIS — Z8249 Family history of ischemic heart disease and other diseases of the circulatory system: Secondary | ICD-10-CM

## 2017-05-05 DIAGNOSIS — Z91040 Latex allergy status: Secondary | ICD-10-CM

## 2017-05-05 DIAGNOSIS — Z95810 Presence of automatic (implantable) cardiac defibrillator: Secondary | ICD-10-CM

## 2017-05-05 DIAGNOSIS — J80 Acute respiratory distress syndrome: Secondary | ICD-10-CM | POA: Diagnosis present

## 2017-05-05 DIAGNOSIS — Z515 Encounter for palliative care: Secondary | ICD-10-CM | POA: Diagnosis not present

## 2017-05-05 DIAGNOSIS — I468 Cardiac arrest due to other underlying condition: Secondary | ICD-10-CM | POA: Diagnosis not present

## 2017-05-05 DIAGNOSIS — Z882 Allergy status to sulfonamides status: Secondary | ICD-10-CM

## 2017-05-05 DIAGNOSIS — Z91041 Radiographic dye allergy status: Secondary | ICD-10-CM

## 2017-05-05 DIAGNOSIS — Z9010 Acquired absence of unspecified breast and nipple: Secondary | ICD-10-CM

## 2017-05-05 DIAGNOSIS — E1122 Type 2 diabetes mellitus with diabetic chronic kidney disease: Secondary | ICD-10-CM | POA: Diagnosis present

## 2017-05-05 DIAGNOSIS — Z794 Long term (current) use of insulin: Secondary | ICD-10-CM

## 2017-05-05 DIAGNOSIS — Z955 Presence of coronary angioplasty implant and graft: Secondary | ICD-10-CM

## 2017-05-05 DIAGNOSIS — Z87891 Personal history of nicotine dependence: Secondary | ICD-10-CM

## 2017-05-05 DIAGNOSIS — E669 Obesity, unspecified: Secondary | ICD-10-CM | POA: Diagnosis present

## 2017-05-05 DIAGNOSIS — Z6835 Body mass index (BMI) 35.0-35.9, adult: Secondary | ICD-10-CM

## 2017-05-05 DIAGNOSIS — J45909 Unspecified asthma, uncomplicated: Secondary | ICD-10-CM | POA: Diagnosis present

## 2017-05-05 DIAGNOSIS — M3500 Sicca syndrome, unspecified: Secondary | ICD-10-CM | POA: Diagnosis present

## 2017-05-05 LAB — CBC
HCT: 37.9 % (ref 36.0–46.0)
Hemoglobin: 12.1 g/dL (ref 12.0–15.0)
MCH: 29.2 pg (ref 26.0–34.0)
MCHC: 31.9 g/dL (ref 30.0–36.0)
MCV: 91.3 fL (ref 78.0–100.0)
PLATELETS: 178 10*3/uL (ref 150–400)
RBC: 4.15 MIL/uL (ref 3.87–5.11)
RDW: 13.4 % (ref 11.5–15.5)
WBC: 11.7 10*3/uL — ABNORMAL HIGH (ref 4.0–10.5)

## 2017-05-05 LAB — I-STAT TROPONIN, ED: Troponin i, poc: 0.03 ng/mL (ref 0.00–0.08)

## 2017-05-05 LAB — I-STAT ARTERIAL BLOOD GAS, ED
ACID-BASE DEFICIT: 15 mmol/L — AB (ref 0.0–2.0)
Bicarbonate: 11.1 mmol/L — ABNORMAL LOW (ref 20.0–28.0)
O2 SAT: 96 %
PH ART: 7.239 — AB (ref 7.350–7.450)
PO2 ART: 88 mmHg (ref 83.0–108.0)
Patient temperature: 97
TCO2: 12 mmol/L — ABNORMAL LOW (ref 22–32)
pCO2 arterial: 25.7 mmHg — ABNORMAL LOW (ref 32.0–48.0)

## 2017-05-05 LAB — I-STAT CHEM 8, ED
BUN: 27 mg/dL — AB (ref 6–20)
CREATININE: 1.4 mg/dL — AB (ref 0.44–1.00)
Calcium, Ion: 1.17 mmol/L (ref 1.15–1.40)
Chloride: 107 mmol/L (ref 101–111)
GLUCOSE: 232 mg/dL — AB (ref 65–99)
HEMATOCRIT: 36 % (ref 36.0–46.0)
Hemoglobin: 12.2 g/dL (ref 12.0–15.0)
Potassium: 5.5 mmol/L — ABNORMAL HIGH (ref 3.5–5.1)
Sodium: 139 mmol/L (ref 135–145)
TCO2: 10 mmol/L — AB (ref 22–32)

## 2017-05-05 LAB — COMPREHENSIVE METABOLIC PANEL
ALT: 178 U/L — ABNORMAL HIGH (ref 14–54)
AST: 522 U/L — ABNORMAL HIGH (ref 15–41)
Albumin: 2.2 g/dL — ABNORMAL LOW (ref 3.5–5.0)
Alkaline Phosphatase: 135 U/L — ABNORMAL HIGH (ref 38–126)
Anion gap: 20 — ABNORMAL HIGH (ref 5–15)
BUN: 21 mg/dL — ABNORMAL HIGH (ref 6–20)
CHLORIDE: 104 mmol/L (ref 101–111)
CO2: 13 mmol/L — ABNORMAL LOW (ref 22–32)
Calcium: 8.7 mg/dL — ABNORMAL LOW (ref 8.9–10.3)
Creatinine, Ser: 1.98 mg/dL — ABNORMAL HIGH (ref 0.44–1.00)
GFR calc Af Amer: 28 mL/min — ABNORMAL LOW (ref >60)
GFR, EST NON AFRICAN AMERICAN: 24 mL/min — AB (ref >60)
Glucose, Bld: 156 mg/dL — ABNORMAL HIGH (ref 65–99)
Potassium: 4.5 mmol/L (ref 3.5–5.1)
Sodium: 137 mmol/L (ref 135–145)
Total Bilirubin: 1.8 mg/dL — ABNORMAL HIGH (ref 0.3–1.2)
Total Protein: 5.8 g/dL — ABNORMAL LOW (ref 6.5–8.1)

## 2017-05-05 LAB — BRAIN NATRIURETIC PEPTIDE: B Natriuretic Peptide: 447 pg/mL — ABNORMAL HIGH (ref 0.0–100.0)

## 2017-05-05 LAB — MAGNESIUM: MAGNESIUM: 3.2 mg/dL — AB (ref 1.7–2.4)

## 2017-05-05 LAB — PROTIME-INR
INR: 3.36
PROTHROMBIN TIME: 33.7 s — AB (ref 11.4–15.2)

## 2017-05-05 LAB — AMMONIA: AMMONIA: 72 umol/L — AB (ref 9–35)

## 2017-05-05 LAB — CBG MONITORING, ED: Glucose-Capillary: 156 mg/dL — ABNORMAL HIGH (ref 65–99)

## 2017-05-05 LAB — LACTIC ACID, PLASMA: LACTIC ACID, VENOUS: 13 mmol/L — AB (ref 0.5–1.9)

## 2017-05-05 LAB — I-STAT CG4 LACTIC ACID, ED: Lactic Acid, Venous: >17 mmol/L (ref 0.5–1.9)

## 2017-05-05 LAB — TROPONIN I: TROPONIN I: 0.03 ng/mL — AB (ref <0.03)

## 2017-05-05 LAB — APTT: aPTT: 52 seconds — ABNORMAL HIGH (ref 24–36)

## 2017-05-05 LAB — TSH: TSH: 25.342 u[IU]/mL — AB (ref 0.350–4.500)

## 2017-05-05 LAB — CORTISOL: Cortisol, Plasma: 37.5 ug/dL

## 2017-05-05 MED ORDER — CALCIUM CHLORIDE 10 % IV SOLN
INTRAVENOUS | Status: AC | PRN
  Administered 2017-05-05: 1 g via INTRAVENOUS

## 2017-05-05 MED ORDER — SODIUM CHLORIDE 0.9 % IV SOLN: INTRAVENOUS | Status: DC | PRN

## 2017-05-05 MED ORDER — SODIUM BICARBONATE 8.4 % IV SOLN
INTRAVENOUS | Status: AC
  Filled 2017-05-05: qty 50

## 2017-05-05 MED ORDER — PANTOPRAZOLE SODIUM 40 MG IV SOLR
40.0000 mg | Freq: Every day | INTRAVENOUS | Status: DC
  Administered 2017-05-05: 40 mg via INTRAVENOUS
  Filled 2017-05-05: qty 40

## 2017-05-05 MED ORDER — DEXTROSE 5 % IV SOLN
0.5000 ug/min | INTRAVENOUS | Status: DC
  Administered 2017-05-05: 1 ug/min via INTRAVENOUS
  Filled 2017-05-05: qty 4

## 2017-05-05 MED ORDER — AMIODARONE HCL IN DEXTROSE 360-4.14 MG/200ML-% IV SOLN: 30.0000 mg/h | INTRAVENOUS | Status: DC

## 2017-05-05 MED ORDER — CHLORHEXIDINE GLUCONATE 0.12% ORAL RINSE (MEDLINE KIT)
15.0000 mL | Freq: Two times a day (BID) | OROMUCOSAL | Status: DC
  Administered 2017-05-05: 15 mL via OROMUCOSAL

## 2017-05-05 MED ORDER — AMIODARONE HCL IN DEXTROSE 360-4.14 MG/200ML-% IV SOLN
INTRAVENOUS | Status: AC
  Filled 2017-05-05: qty 200

## 2017-05-05 MED ORDER — SODIUM CHLORIDE 0.9 % IV SOLN
INTRAVENOUS | Status: DC
  Administered 2017-05-05: 21:00:00 via INTRAVENOUS

## 2017-05-05 MED ORDER — FENTANYL 2500MCG IN NS 250ML (10MCG/ML) PREMIX INFUSION
0.0000 ug/h | INTRAVENOUS | Status: DC
  Administered 2017-05-05: 25 ug/h via INTRAVENOUS
  Filled 2017-05-05: qty 250

## 2017-05-05 MED ORDER — HEPARIN SODIUM (PORCINE) 5000 UNIT/ML IJ SOLN
5000.0000 [IU] | Freq: Three times a day (TID) | INTRAMUSCULAR | Status: DC
  Filled 2017-05-05: qty 1

## 2017-05-05 MED ORDER — MAGNESIUM SULFATE 50 % IJ SOLN
INTRAMUSCULAR | Status: AC | PRN
  Administered 2017-05-05: 2 g via INTRAVENOUS

## 2017-05-05 MED ORDER — EPINEPHRINE PF 1 MG/10ML IJ SOSY
PREFILLED_SYRINGE | INTRAMUSCULAR | Status: AC | PRN
  Administered 2017-05-05 (×3): 1 mg via INTRAVENOUS

## 2017-05-05 MED ORDER — MIDAZOLAM HCL 2 MG/2ML IJ SOLN
1.0000 mg | INTRAMUSCULAR | Status: DC | PRN
  Administered 2017-05-05: 2 mg via INTRAVENOUS

## 2017-05-05 MED ORDER — IOPAMIDOL (ISOVUE-370) INJECTION 76%
INTRAVENOUS | Status: AC
  Administered 2017-05-05: 100 mL
  Filled 2017-05-05: qty 100

## 2017-05-05 MED ORDER — MIDAZOLAM HCL 2 MG/2ML IJ SOLN
1.0000 mg | INTRAMUSCULAR | Status: DC | PRN
  Administered 2017-05-05: 4 mg via INTRAVENOUS
  Administered 2017-05-06 (×2): 2 mg via INTRAVENOUS
  Filled 2017-05-05 (×2): qty 2
  Filled 2017-05-05: qty 4

## 2017-05-05 MED ORDER — DIPHENHYDRAMINE HCL 50 MG/ML IJ SOLN
25.0000 mg | Freq: Once | INTRAMUSCULAR | Status: AC
  Administered 2017-05-05: 25 mg via INTRAVENOUS
  Filled 2017-05-05: qty 1

## 2017-05-05 MED ORDER — ASPIRIN 300 MG RE SUPP
300.0000 mg | RECTAL | Status: DC
  Filled 2017-05-05 (×2): qty 1

## 2017-05-05 MED ORDER — DEXTROSE 5 % IV SOLN
0.0000 ug/min | INTRAVENOUS | Status: DC
  Administered 2017-05-05: 5 ug/min via INTRAVENOUS
  Filled 2017-05-05: qty 4

## 2017-05-05 MED ORDER — ORAL CARE MOUTH RINSE
15.0000 mL | OROMUCOSAL | Status: DC
  Administered 2017-05-06 (×2): 15 mL via OROMUCOSAL

## 2017-05-05 MED ORDER — HYDROCORTISONE NA SUCCINATE PF 100 MG IJ SOLR
100.0000 mg | Freq: Once | INTRAMUSCULAR | Status: AC
Start: 2017-05-05 — End: 2017-05-05
  Administered 2017-05-05: 100 mg via INTRAVENOUS
  Filled 2017-05-05: qty 2

## 2017-05-05 MED ORDER — FAMOTIDINE IN NACL 20-0.9 MG/50ML-% IV SOLN
20.0000 mg | Freq: Once | INTRAVENOUS | Status: AC
  Administered 2017-05-05: 20 mg via INTRAVENOUS
  Filled 2017-05-05: qty 50

## 2017-05-05 MED ORDER — SODIUM BICARBONATE 8.4 % IV SOLN
INTRAVENOUS | Status: AC | PRN
  Administered 2017-05-05: 50 meq via INTRAVENOUS
  Administered 2017-05-05: 100 meq via INTRAVENOUS
  Administered 2017-05-05: 50 meq via INTRAVENOUS

## 2017-05-05 MED ORDER — SODIUM CHLORIDE 0.9 % IV SOLN: 250.0000 mL | INTRAVENOUS | Status: DC | PRN

## 2017-05-05 MED ORDER — MIDAZOLAM HCL 2 MG/2ML IJ SOLN
2.0000 mg | Freq: Once | INTRAMUSCULAR | Status: AC
  Administered 2017-05-05: 2 mg via INTRAVENOUS

## 2017-05-05 MED ORDER — MIDAZOLAM HCL 2 MG/2ML IJ SOLN
INTRAMUSCULAR | Status: AC
  Filled 2017-05-05: qty 2

## 2017-05-05 MED ORDER — AMIODARONE IV BOLUS ONLY 150 MG/100ML
150.0000 mg | Freq: Once | INTRAVENOUS | Status: AC
  Administered 2017-05-05: 150 mg via INTRAVENOUS

## 2017-05-05 MED ORDER — ASPIRIN 300 MG RE SUPP: 300.0000 mg | RECTAL | Status: DC

## 2017-05-05 MED ORDER — AMIODARONE HCL IN DEXTROSE 360-4.14 MG/200ML-% IV SOLN
60.0000 mg/h | INTRAVENOUS | Status: DC
  Administered 2017-05-05: 60 mg/h via INTRAVENOUS
  Administered 2017-05-05: via INTRAVENOUS
  Administered 2017-05-06: 60 mg/h via INTRAVENOUS
  Filled 2017-05-05 (×2): qty 200

## 2017-05-05 NOTE — Progress Notes (Signed)
eLink Physician-Brief Progress Note Patient Name: [name] DOB: Feb 17, 1946 MRN: [id number]   Date of Service  May 08, 2017  HPI/Events of Note  Notified by bedside nurse of intermittent cardioversion by implanted device. Rhythm reviewed with questionable wide complex events and evidence of paced rhythm. Currently on vasopressor through peripheral IV. Status post arrest. Orders reviewed showing order for cardiology consultation but I do not see a consult note and nursing staff report that patient has not been evaluated by cardiology since presenting to the ICU.   eICU Interventions  1. Contacted intensivist for central venous access placement 2. Bolus amiodarone 150 mg IV 3. Starting amiodarone drip 4. Respiratory therapy to place peripheral arterial line for blood pressure monitoring 5. Intensivist or Elink M.D. to ensure cardiology has been made aware of consultation      Intervention Category Major Interventions: Arrhythmia - evaluation and management  [name] 05-08-2017, 11:33 PM

## 2017-05-05 NOTE — Progress Notes (Signed)
   04/25/2017 2000  Clinical Encounter Type  Visited With Other (Comment) (friend Steward Drone)  Visit Type Initial;Spiritual support;ED  Referral From (ED)  Spiritual Encounters  Spiritual Needs Prayer  Stress Factors  Patient Stress Factors Not reviewed  Family Stress Factors Not reviewed  Friend was asked about how to contact family. She gave info she had available. Chapllain looked on cahrt and was able to get Rosana Berger on the phone. Asked her to come to the ED at [facility]. She called Brayton Layman and requested that she come also to ED.  I notified the chaplain coming on at 8:30 pm. Phebe Colla, Chaplain

## 2017-05-05 NOTE — Progress Notes (Signed)
Pt transported to and from CT without event.  

## 2017-05-05 NOTE — Progress Notes (Signed)
Pt transported to 2H 13 without event.  Report given to ICU therapist.

## 2017-05-05 NOTE — ED Notes (Signed)
Artic sun pads

## 2017-05-05 NOTE — H&P (Signed)
..   Name: [name] MRN: [id number] DOB: Aug 19, 1946    ADMISSION DATE:  04/29/2017 CONSULTATION DATE: 05/02/2017 REFERRING MD :  ER Attending CHIEF COMPLAINT:  Productive cough, SOB, syncopal episode, cardiopulmonary arrest  BRIEF PATIENT DESCRIPTION: 71 yr old female with PMHx HFrEF s/p BiVICD presents s/p cardiopulmonary arrest for 26 mins ROSC achieved in ED, intubated in ED initial rhythm Asystole per EMS. CODE COOL initiated.  SIGNIFICANT EVENTS  S/p cardiopulmonary arrest  STUDIES:  CT Abdomen/Pelvis CT PE protocol   HISTORY OF PRESENT ILLNESS:  Per sister, EMS and ER staff (nurse and physician) 71 yr old female with PMHx of ischemic cardiomyopathy,HFrEF, s/p BiVICD, s/p RCA stent, Psoriasis, Lupus, Rhemuatoid arthritis, Sjogren, Stroke,LBBB, Afib, HTN on chronic antocoagulation with warfarin for previously diagnosed PE presents from home. Per her sister she had a horrible cough for several weeks gradually worsening and today when she spoke to her on the phone she could hear her sister wheezing. She told a neighbor and friend to go to her sister and the friend upon seeing the patient called EMS. Upon arrival patient "passed out" no pulse palpated and compressions started by EMS. Initial rhythm was asystole then PEA. No epi was given in the field. Pt received first dose of epi in ER and was endotracheally intubated in ER. ROSC achieved in total time of 26 mins. CCM called to evaluate patient.   PAST MEDICAL HISTORY :   has a past medical history of Asthma; Atrial fibrillation ([place]); Chronic renal insufficiency; Chronic systolic dysfunction of left ventricle; Coronary artery disease (2002); Depression; Diabetes mellitus; Fibromyalgia; Hyperlipidemia; Hypertension; Ischemic dilated cardiomyopathy (HCC); LBBB (left bundle branch block); Lupus; Obesity; Psoriasis; Pulmonary embolism (HCC); RA (rheumatoid arthritis) ([place]); Sjogren's syndrome ([place]); and Stroke ([place]).  has a past surgical  history that includes Mastectomy (1982); Angioplasty (2002); hysterectomy (otheR); biventricular defibrillator implantation (08/12/07); and implantable cardioverter defibrillator generator change (N/A, 03/18/2013). Prior to Admission medications   Medication Sig Start Date End Date Taking? Authorizing Provider  atorvastatin (LIPITOR) 80 MG tablet Take 1 tablet (80 mg total) by mouth daily. 12/15/14   Sueanne Margarita, MD  buPROPion (WELLBUTRIN) 100 MG tablet Take 100 mg by mouth 2 (two) times daily. 05/06/15   [provider]  carisoprodol (SOMA) 350 MG tablet Take 350 mg by mouth daily as needed for muscle spasms (pain).  01/01/13   [provider]  carvedilol (COREG) 3.125 MG tablet TAKE 1 TABLET BY MOUTH TWICE DAILY 06/25/14   Sueanne Margarita, MD  Cholecalciferol (VITAMIN D) 2000 UNITS tablet Take 2,000 Units by mouth every evening.    [provider]  diclofenac sodium (VOLTAREN) 1 % GEL Apply 1 application topically daily as needed (pain).    [provider]  diltiazem (TIAZAC) 240 MG 24 hr capsule Take 1 capsule (240 mg total) by mouth every evening. 10/29/14   Sueanne Margarita, MD  diphenhydrAMINE (BENADRYL) 25 MG tablet Take 25 mg by mouth daily as needed for allergies.    [provider]  DULoxetine (CYMBALTA) 60 MG capsule Take 60 mg by mouth 2 (two) times daily.  10/22/14   [provider]  ezetimibe (ZETIA) 10 MG tablet Take 10 mg by mouth daily.    [provider]  furosemide (LASIX) 40 MG tablet Take 1 tablet (40 mg total) by mouth 2 (two) times daily. 09/21/13   Sueanne Margarita, MD  hydrALAZINE (APRESOLINE) 10 MG tablet Take 1 tablet (10 mg total) by mouth 4 (four)  times daily. 04/13/14   Sueanne Margarita, MD  hyoscyamine (LEVSIN SL) 0.125 MG SL tablet Place 1 tablet under the tongue daily as needed. 10/05/15   [provider]  isosorbide dinitrate (ISORDIL) 30 MG tablet Take 15 mg by mouth daily.    [provider]    LANTUS 100 UNIT/ML injection Inject 36 Units into the skin at bedtime.  03/23/15   [provider]  linagliptin (TRADJENTA) 5 MG TABS tablet Take 5 mg by mouth daily.    [provider]  LORazepam (ATIVAN) 1 MG tablet Take 1 mg by mouth 3 (three) times daily as needed for anxiety (sleep).     [provider]  oxyCODONE-acetaminophen (PERCOCET/ROXICET) 5-325 MG per tablet Take 1 tablet by mouth every 6 (six) hours as needed (pain).  06/19/13   [provider]  ranitidine (ZANTAC) 300 MG capsule Take 300 mg by mouth 2 (two) times daily. 09/29/14   [provider]  rOPINIRole (REQUIP) 1 MG tablet Take 1 mg by mouth at bedtime as needed (sleep/restless legs).  02/07/13   [provider]  spironolactone (ALDACTONE) 25 MG tablet Take 1 tablet (25 mg total) by mouth daily. Patient is overdue for an appt. Please call and schedule for further refills 08/24/16   Patsey Berthold, NP  SYNTHROID 150 MCG tablet Take 150 mg by mouth daily before breakfast. 10/31/15   [provider]  SYNTHROID 175 MCG tablet Take 175 mg by mouth daily before breakfast.  10/11/14   [provider]  tiZANidine (ZANAFLEX) 4 MG tablet Take 4 mg by mouth every 8 (eight) hours as needed. For muscle spasms 09/02/14   [provider]  warfarin (COUMADIN) 10 MG tablet Take 10 mg by mouth daily at 6 PM.    [provider]   Allergies  Allergen Reactions  . Contrast Media [Iodinated Diagnostic Agents] Hives and Itching  . Levothyroxine Other (See Comments)    MUST BE BRAND NAME ONLY-generic doesn't work for patient  . Latex Rash  . Penicillins Rash  . Sulfonamide Derivatives Rash    FAMILY HISTORY:  family history includes Heart attack in her father; Heart disease in her father. SOCIAL HISTORY: In the past patient reported smoking cessation Never used smokeless tobacco products per chart and history.   REVIEW OF SYSTEMS:  Unable to obtain due to  patient intubated and unresponsive Constitutional: Negative for fever, chills, weight loss, malaise/fatigue and diaphoresis.  HENT: Negative for hearing loss, ear pain, nosebleeds, congestion, sore throat, neck pain, tinnitus and ear discharge.   Eyes: Negative for blurred vision, double vision, photophobia, pain, discharge and redness.  Respiratory: Negative for cough, hemoptysis, sputum production, shortness of breath, wheezing and stridor.   Cardiovascular: Negative for chest pain, palpitations, orthopnea, claudication, leg swelling and PND.  Gastrointestinal: Negative for heartburn, nausea, vomiting, abdominal pain, diarrhea, constipation, blood in stool and melena.  Genitourinary: Negative for dysuria, urgency, frequency, hematuria and flank pain.  Musculoskeletal: Negative for myalgias, back pain, joint pain and falls.  Skin: Negative for itching and rash.  Neurological: Negative for dizziness, tingling, tremors, sensory change, speech change, focal weakness, seizures, loss of consciousness, weakness and headaches.  Endo/Heme/Allergies: Negative for environmental allergies and polydipsia. Does not bruise/bleed easily.  SUBJECTIVE:   VITAL SIGNS: Temp:  [97.4 F (36.3 C)] 97.4 F (36.3 C) (09/16 2006) Pulse Rate:  [81-93] 81 (09/16 1958) Resp:  [16-19] 19 (09/16 1958) BP: (88-116)/(59-73) 88/59 (09/16 1958) SpO2:  [79 %-89 %] 86 % (  09/16 2023) FiO2 (%):  [100 %] 100 % (09/16 1958)  PHYSICAL EXAMINATION: General:  Intubated minimally responsive Neuro: + gag right pupil 3 left pupil 4 both reactive right more sluggish than left. Myoclonic jerking. HEENT: intubated endotracheal tube. Subglottic suction.+ gag  Cardiovascular:  Runs of bigeminy on monitor then into NSR. Lungs: coarse breath sounds diffusely with ventilator associated sounds some exp wheeze Abdomen: soft distended obese abdomen hypoactive BS Musculoskeletal: no deformity +1 edema in b/l lower extremities Skin:  Clean  and intact  No results for input(s): NA, K, CL, CO2, BUN, CREATININE, GLUCOSE in the last 168 hours. No results for input(s): HGB, HCT, WBC, PLT in the last 168 hours. Dg Chest Portable 1 View  Result Date: 05/09/2017 CLINICAL DATA:  Endotracheal tube placement EXAM: PORTABLE CHEST 1 VIEW COMPARISON:  Chest radiograph 11/04/2015 FINDINGS: The tip of the endotracheal tube is approximately 2.7 cm above the inferior margin of the carina. Retraction by 3 cm should result in optimal positioning. Otherwise, the examination is severely limited by external object overlying the chest. There are extensive bilateral airspace opacities, which are otherwise impossible to characterize on this study. IMPRESSION: Endotracheal tube tip 2.7 cm above the inferior margin of the carina. Recommend retraction by 3 cm for optimum positioning. Electronically Signed   By: Ulyses Jarred M.D.   On: 05/12/2017 20:07   Procedures: Central Line Placement- RIJ. Under Ultrasound guidance CXR reviewed by me personally Ok to use.  Bedside Critical Care Ultrasound performed evaluating cardiac windows and b/l lung windows-> performed subcostal and short parasternal views  Unable to appreciate movement of inferiolateral wall and RV appears large. IVC 2.25cm in size. Lungs bilaterally have evidence on Kerley B lines and some areas of hepatization suggestive of consolidation.    ASSESSMENT / PLAN:  NEURO: Evaluated in ER met criteria for CODE COOL  36 degree goal started no neuromuscular blockade given GCS 5-6 Sedation- fenatnyl 200 IV started upon arriving to ICU with intermittent Versed pushes previously on no sedation having myoclonic jerks/ ?seizure activity Consult placed to Neurology and spoke with them: EEG stat No antiepileptic or sedative medication for now in an attempt to capture activity on EEG Possibility for depakote if myoclonic activity worsens   CARDIAC: Marland KitchenMarland KitchenCardiac Panel (last 3 results)  Recent Labs   04/30/2017 2115  TROPONINI 0.03*  initial rhythm Asystole-> PEA Pt did not receive Epinephrine until in ED ROSC achieved in 26 mins on Levophed at 5  MAP goal> 65 Placed Right IJ CVC  under ultrasound guidance CVP 23- correlates with IVC 2.25 and  reflux into hepatic portal system seen on CT Right sided heart failure Cardiology Consult placed-> they evaluated patient  Per consultant she has not been seen by a Cardiologist since Jan BiVICD interrogated and on 3 episodes of NSVT seen no shocks administered TTE pending H/o ischemic cardiomyopathy noted, previous RCA stent last EF of 55% by echo and 49% by a nuclear stress myocardial perfusion scan in 2016 showing an EF of 49% with a large inferior scar.     PULMONARY: ARDS PF ratio 88 per last ABG Intubated on Mechanical ventilation High PEEP Low TV strategy  Current PEEP at 10 Sat 94% Respiratory viral panel , Atypical pneumonia workup  Coverage with empiric antibiotics  ID: Pulmonary source most likely  Blood cx x 2 prior to antibiotics Empiric coverage  with Vancomycin and Levaquin  H/o penicillin allergy - rash   Endocrine: TSH pending H/o Diabetes mellitus  BG >126  mg/dl Started on ICU Glycemic protocol with subcu insulin Cortiosl level pending  GI: NPO OGT in place Will evaluate in AM if appropiate to start TF Prophylaxis with protonix ob board CTA/P reviewed by me reflux  INR 3-> pt has a history of being on coumadin outpt    Heme: .Hgb 12 and Plts 178 No signs of active bleeding some blood intially seen in OGT If Hgb drops to <7 indication for transfusion of PRBCs ..B POS INR elevated secondary to Coumadin Hold coumadin to see if it nomalizes DVT PPx-> [place] heparin and SCDs  RENAL Acute Kidney Injury on CKD .Marland Kitchen Lab Results  Component Value Date   CREATININE 1.98 (H) 04/30/2017   CREATININE 1.40 (H) 04/27/2017   CREATININE 1.49 (H) 11/04/2015  very minimal UOP Indwelling foley catheter in  place Most likely secondary to decreased perfusion to kidneys  May require higher MAP gaol for perfusion.  DISPOSITION:Admitted to ICU Code Status: DNR Next of kin: Sister Critical Care Time: 33 mins  I have discussed the plan of care with the medical team (including consultants, NP/PA, RN) and patient/patient's family members. Sister confirmed code status Explained that prognosis is guarded    Signed Dr Seward [facility] Pulmonary Critical Care Locums [facility] [place] Pager: [phone]  05/16/2017, 9:20 PM

## 2017-05-05 NOTE — ED Notes (Signed)
CCM at bedside; code cool initiated; ice packs applied

## 2017-05-05 NOTE — Progress Notes (Signed)
   04/24/2017 2215  Clinical Encounter Type  Visited With Patient and family together  Visit Type Follow-up  Spiritual Encounters  Spiritual Needs Emotional  Stress Factors  Patient Stress Factors Health changes  Family Stress Factors Family relationships  Introduction to Pt's sister. [employer] of presence. Escorted family to ICU.

## 2017-05-05 NOTE — Progress Notes (Signed)
   2017-06-04 2045  Clinical Encounter Type  Visited With Health care provider  Visit Type Follow-up  Spiritual Encounters  Spiritual Needs Other (Comment) (update)  Stress Factors  Patient Stress Factors Not reviewed  Family Stress Factors Not reviewed  Pt at CT scan. Friend unavailable. Page if needed.

## 2017-05-05 NOTE — ED Notes (Signed)
Family at bedside. 

## 2017-05-05 NOTE — Code Documentation (Signed)
Pt called EMS for SOB x 2 days with generalized weakness. On arrival, pt was pale, oxygen sats 74%; pt stood to pivot to stretcher and lost pulses. CPR started at 1905; no meds given enroute. Paced rhythm noted. King airway in place; Capnography 45

## 2017-05-05 NOTE — ED Provider Notes (Signed)
[facility] DEPT Provider Note   CSN: [id number] Arrival date & time: 05/04/2017  1930     History   Chief Complaint Chief Complaint  Patient presents with  . Cardiac Arrest    HPI [name] is a 71 y.o. female.  This is a 71 year old female with PMH of atrial fibrillation, CKD, CHF, CAD, T2 DM, HLD, HTN, obesity, prior PE, ICD/pacemaker who presents as an active CPR in progress.  Patient unresponsive and pulseless, level 5 caveat.  According to EMS they arrived for shortness of breath call for 2 days with generalized weakness.  On arrival patient was pale and oxygen saturations in the mid 70s.  The patient stood to sit on the stretcher and she suddenly lost her pulse.  CPR was started at 1905 no meds given in route, no cardioversion attempted.  Paced rhythm was noted.  King airway was placed by EMS and capnography was in the 40s. Glucose was in the 150s.   The history is provided by the EMS personnel. The history is limited by the condition of the patient.    Past Medical History:  Diagnosis Date  . Asthma   . Atrial fibrillation ([place])   . Chronic renal insufficiency   . Chronic systolic dysfunction of left ventricle   . Coronary artery disease 2002   s/p PCI of RCA  . Depression   . Diabetes mellitus   . Fibromyalgia   . Hyperlipidemia   . Hypertension   . 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 LBBB (left bundle branch block)   . Lupus   . Obesity   . Psoriasis   . Pulmonary embolism ([place])    previously on coumadin  . RA (rheumatoid arthritis) ([place])   . Sjogren's syndrome ([place])   . Stroke [facility])    TIA    Patient Active Problem List   Diagnosis Date Noted  . Cardiac arrest, cause unspecified ([place]) 04/28/2017  . Cardiopulmonary arrest ([place]) 04/28/2017  . Acute on chronic systolic ACC/AHA stage C congestive heart failure ([place]) 05/18/2015  . Hypertensive cardiovascular disease 05/18/2015  . Chronic renal insufficiency  05/18/2015  . Other pulmonary embolism and infarction 06/19/2013  . CAD (coronary artery disease) 05/26/2013  . 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 Paroxysmal atrial fibrillation ([place]) 05/26/2013  . Chronic anticoagulation 05/26/2013  . Chronic systolic dysfunction of left ventricle 07/05/2011  . Hyperlipidemia 07/05/2011  . Hypertension 07/05/2011  . DEPRESSION 09/09/2007  . ASTHMA 09/09/2007    Past Surgical History:  Procedure Laterality Date  . ANGIOPLASTY  2002  . biventricular defibrillator implantation  29/52/84   MDT Concerto implanted by Dr Leonia Reeves; generator change 03-18-2013 by Dr Rayann Heman  . hysterectomy (otheR)    . IMPLANTABLE CARDIOVERTER DEFIBRILLATOR GENERATOR CHANGE N/A 03/18/2013   MDT Viva XR BIV ICD generator change by Dr Rayann Heman  . MASTECTOMY  1982    OB History    No data available       Home Medications    Prior to Admission medications   Medication Sig Start Date End Date Taking? Authorizing Provider  atorvastatin (LIPITOR) 80 MG tablet Take 1 tablet (80 mg total) by mouth daily. 12/15/14   Sueanne Margarita, MD  buPROPion (WELLBUTRIN) 100 MG tablet Take 100 mg by mouth 2 (two) times daily. 05/06/15   [provider]  carisoprodol (SOMA) 350 MG tablet Take 350 mg by mouth daily as needed for muscle spasms (pain).  01/01/13   [provider]  carvedilol (COREG) 3.125 MG tablet TAKE 1 TABLET BY MOUTH TWICE DAILY 06/25/14   Sueanne Margarita, MD  Cholecalciferol (VITAMIN D) 2000 UNITS tablet Take 2,000 Units by mouth every evening.    [provider]  diclofenac sodium (VOLTAREN) 1 % GEL Apply 1 application topically daily as needed (pain).    [provider]  diltiazem (TIAZAC) 240 MG 24 hr capsule Take 1 capsule (240 mg total) by mouth every evening. 10/29/14   Sueanne Margarita, MD  diphenhydrAMINE (BENADRYL) 25 MG tablet Take 25 mg by mouth daily as needed for allergies.    [provider]  DULoxetine  (CYMBALTA) 60 MG capsule Take 60 mg by mouth 2 (two) times daily.  10/22/14   [provider]  ezetimibe (ZETIA) 10 MG tablet Take 10 mg by mouth daily.    [provider]  furosemide (LASIX) 40 MG tablet Take 1 tablet (40 mg total) by mouth 2 (two) times daily. 09/21/13   Sueanne Margarita, MD  hydrALAZINE (APRESOLINE) 10 MG tablet Take 1 tablet (10 mg total) by mouth 4 (four) times daily. 04/13/14   Sueanne Margarita, MD  hyoscyamine (LEVSIN SL) 0.125 MG SL tablet Place 1 tablet under the tongue daily as needed. 10/05/15   [provider]  isosorbide dinitrate (ISORDIL) 30 MG tablet Take 15 mg by mouth daily.    [provider]  LANTUS 100 UNIT/ML injection Inject 36 Units into the skin at bedtime.  03/23/15   [provider]  linagliptin (TRADJENTA) 5 MG TABS tablet Take 5 mg by mouth daily.    [provider]  LORazepam (ATIVAN) 1 MG tablet Take 1 mg by mouth 3 (three) times daily as needed for anxiety (sleep).     [provider]  oxyCODONE-acetaminophen (PERCOCET/ROXICET) 5-325 MG per tablet Take 1 tablet by mouth every 6 (six) hours as needed (pain).  06/19/13   [provider]  ranitidine (ZANTAC) 300 MG capsule Take 300 mg by mouth 2 (two) times daily. 09/29/14   [provider]  rOPINIRole (REQUIP) 1 MG tablet Take 1 mg by mouth at bedtime as needed (sleep/restless legs).  02/07/13   [provider]  spironolactone (ALDACTONE) 25 MG tablet Take 1 tablet (25 mg total) by mouth daily. Patient is overdue for an appt. Please call and schedule for further refills 08/24/16   Patsey Berthold, NP  SYNTHROID 150 MCG tablet Take 150 mg by mouth daily before breakfast. 10/31/15   [provider]  SYNTHROID 175 MCG tablet Take 175 mg by mouth daily before breakfast.  10/11/14   [provider]  tiZANidine (ZANAFLEX) 4 MG tablet Take 4 mg by mouth every 8 (eight) hours as needed. For muscle spasms 09/02/14    [provider]  warfarin (COUMADIN) 10 MG tablet Take 10 mg by mouth daily at 6 PM.    [provider]    Family History Family History  Problem Relation Age of Onset  . Heart disease Father   . Heart attack Father     Social History Social History  Substance Use Topics  . Smoking status: Former Research scientist (life sciences)  . Smokeless tobacco: Never Used  . Alcohol use No     Allergies   Contrast media [iodinated diagnostic agents]; Levothyroxine; Latex; Penicillins; and Sulfonamide derivatives   Review of Systems Review of Systems  Unable to perform ROS: Patient unresponsive     Physical Exam Updated Vital  Signs BP (!) 65/45   Pulse (!) 106   Temp (!) 97.4 F (36.3 C) (Temporal)   Resp (!) 36   LMP  (LMP Unknown)   SpO2 91%   Physical Exam  Constitutional: She appears toxic. She appears distressed. She is intubated.  HENT:  Head: Normocephalic and atraumatic. Head is without raccoon's eyes, without Battle's sign, without contusion and without laceration.  Eyes: Conjunctivae are normal.  Neck: Neck supple.  Cardiovascular: An irregular rhythm present.  No murmur heard. Pulses:      Carotid pulses are 0 on the right side, and 0 on the left side.      Radial pulses are 0 on the right side, and 0 on the left side.       Femoral pulses are 0 on the right side, and 0 on the left side.      Dorsalis pedis pulses are 0 on the right side, and 0 on the left side.  Pulmonary/Chest: She is intubated. She has decreased breath sounds in the right middle field and the left middle field. She has rhonchi in the right middle field, the right lower field, the left middle field and the left lower field.  Abdominal: Soft. She exhibits no pulsatile midline mass. There is no rigidity.  Musculoskeletal: She exhibits no edema.  Neurological: She is unresponsive.  Skin: Skin is warm. Capillary refill takes 2 to 3 seconds. She is diaphoretic. There is cyanosis. There is pallor.    Nursing note and vitals reviewed.    ED Treatments / Results  Labs (all labs ordered are listed, but only abnormal results are displayed) Labs Reviewed  LACTIC ACID, PLASMA - Abnormal; Notable for the following:       Result Value   Lactic Acid, Venous 13.0 (*)    All other components within normal limits  TROPONIN I - Abnormal; Notable for the following:    Troponin I 0.03 (*)    All other components within normal limits  PROTIME-INR - Abnormal; Notable for the following:    Prothrombin Time 33.7 (*)    All other components within normal limits  APTT - Abnormal; Notable for the following:    aPTT 52 (*)    All other components within normal limits  CBC - Abnormal; Notable for the following:    WBC 11.7 (*)    All other components within normal limits  TSH - Abnormal; Notable for the following:    TSH 25.342 (*)    All other components within normal limits  I-STAT CG4 LACTIC ACID, ED - Abnormal; Notable for the following:    Lactic Acid, Venous >17.00 (*)    All other components within normal limits  I-STAT ARTERIAL BLOOD GAS, ED - Abnormal; Notable for the following:    pH, Arterial 7.239 (*)    pCO2 arterial 25.7 (*)    Bicarbonate 11.1 (*)    TCO2 12 (*)    Acid-base deficit 15.0 (*)    All other components within normal limits  CBG MONITORING, ED - Abnormal; Notable for the following:    Glucose-Capillary 156 (*)    All other components within normal limits  CORTISOL  TROPONIN I  TROPONIN I  COMPREHENSIVE METABOLIC PANEL  AMMONIA  URINALYSIS, ROUTINE W REFLEX MICROSCOPIC  BRAIN NATRIURETIC PEPTIDE  MAGNESIUM  I-STAT TROPONIN, ED    EKG  EKG Interpretation None       Radiology Ct Angio Chest Pe W And/or Wo Contrast  Result Date: 04/30/2017 CLINICAL  DATA:  Cardiac arrest in the ED after shortness of breath for a few days. Patient carries a history of contrast allergy and was pretreated. EXAM: CT ANGIOGRAPHY CHEST CT ABDOMEN AND PELVIS WITH CONTRAST  TECHNIQUE: Multidetector CT imaging of the chest was performed using the standard protocol during bolus administration of intravenous contrast. Multiplanar CT image reconstructions and MIPs were obtained to evaluate the vascular anatomy. Multidetector CT imaging of the abdomen and pelvis was performed using the standard protocol during bolus administration of intravenous contrast. CONTRAST:  100 mL Isovue 370 COMPARISON:  CT abdomen and pelvis 12/29/2013 FINDINGS: CTA CHEST FINDINGS Cardiovascular: Examination is severely limited due to motion artifact. There is cardiac enlargement. Pulmonary arteries are well opacified. No evidence of significant pulmonary embolus. Normal caliber thoracic aorta. No evidence of aortic dissection. Great vessel origins appear patent although there is significant calcification present. Calcification in the coronary arteries. No pericardial effusion. Reflux of contrast material into the hepatic veins may indicate right heart failure. Mediastinum/Nodes: Limited evaluation of the mediastinum due to motion artifact. No severe lymphadenopathy although significant lymphadenopathy could be obscured due to artifact. Endotracheal and enteric tubes are in place. The esophagus is decompressed. Lungs/Pleura: Limited evaluation of the lungs due to motion artifact. There is diffuse airspace disease throughout both lungs. This could indicate edema, multifocal pneumonia, ARDS, aspiration, or hemorrhage. Central airways appear grossly patent. There are small bilateral pleural effusions. No obvious pneumothorax. Musculoskeletal: Degenerative changes in the spine. No destructive bone lesions appreciated. Review of the MIP images confirms the above findings. CT ABDOMEN and PELVIS FINDINGS Hepatobiliary: Examination is severely limited due to motion artifact. No obvious focal liver lesions are identified. Visualized gallbladder and bile ducts are unremarkable. Pancreas: Visualized pancreas appears intact  without gross fluid or inflammatory change. Spleen: No obvious focal spleen lesions are identified. Spleen size is normal. Adrenals/Urinary Tract: No adrenal gland nodules. Renal nephrograms are homogeneous and symmetrical. No hydronephrosis. No obvious focal lesions. A Foley catheter decompresses the bladder. Stomach/Bowel: Stomach is decompressed with an enteric tube tip in the body of the stomach. Small bowel are decompressed. Colon is mostly decompressed with scattered stool mostly in the cecum. No obvious obstruction. Appendix is not identified. Vascular/Lymphatic: Extensive vascular calcifications throughout the aorta and major vessels. Major vessels appear grossly patent although evaluation is limited due to motion artifact. No significant lymphadenopathy. Reproductive: Status post hysterectomy. No adnexal masses. Other: No obvious free air or free fluid in the abdomen. Musculoskeletal: Degenerative changes in the spine. No destructive bone lesions appreciated. Review of the MIP images confirms the above findings. IMPRESSION: 1. Examination is severely degraded by patient motion. 2. Cardiac enlargement with evidence of right heart failure. 3. Diffuse bilateral airspace disease throughout both lungs. This could indicate edema, multifocal pneumonia, ARDS, aspiration, or hemorrhage. Small bilateral pleural effusions. 4. Endotracheal and enteric tubes are in place. 5. No obvious acute abnormality demonstrated in the abdomen or pelvis. Electronically Signed   By: Lucienne Capers M.D.   On: 04/22/2017 21:33   Ct Abdomen Pelvis W Contrast  Result Date: 04/30/2017 CLINICAL DATA:  Cardiac arrest in the ED after shortness of breath for a few days. Patient carries a history of contrast allergy and was pretreated. EXAM: CT ANGIOGRAPHY CHEST CT ABDOMEN AND PELVIS WITH CONTRAST TECHNIQUE: Multidetector CT imaging of the chest was performed using the standard protocol during bolus administration of intravenous  contrast. Multiplanar CT image reconstructions and MIPs were obtained to evaluate the vascular anatomy. Multidetector CT imaging of the  abdomen and pelvis was performed using the standard protocol during bolus administration of intravenous contrast. CONTRAST:  100 mL Isovue 370 COMPARISON:  CT abdomen and pelvis 12/29/2013 FINDINGS: CTA CHEST FINDINGS Cardiovascular: Examination is severely limited due to motion artifact. There is cardiac enlargement. Pulmonary arteries are well opacified. No evidence of significant pulmonary embolus. Normal caliber thoracic aorta. No evidence of aortic dissection. Great vessel origins appear patent although there is significant calcification present. Calcification in the coronary arteries. No pericardial effusion. Reflux of contrast material into the hepatic veins may indicate right heart failure. Mediastinum/Nodes: Limited evaluation of the mediastinum due to motion artifact. No severe lymphadenopathy although significant lymphadenopathy could be obscured due to artifact. Endotracheal and enteric tubes are in place. The esophagus is decompressed. Lungs/Pleura: Limited evaluation of the lungs due to motion artifact. There is diffuse airspace disease throughout both lungs. This could indicate edema, multifocal pneumonia, ARDS, aspiration, or hemorrhage. Central airways appear grossly patent. There are small bilateral pleural effusions. No obvious pneumothorax. Musculoskeletal: Degenerative changes in the spine. No destructive bone lesions appreciated. Review of the MIP images confirms the above findings. CT ABDOMEN and PELVIS FINDINGS Hepatobiliary: Examination is severely limited due to motion artifact. No obvious focal liver lesions are identified. Visualized gallbladder and bile ducts are unremarkable. Pancreas: Visualized pancreas appears intact without gross fluid or inflammatory change. Spleen: No obvious focal spleen lesions are identified. Spleen size is normal.  Adrenals/Urinary Tract: No adrenal gland nodules. Renal nephrograms are homogeneous and symmetrical. No hydronephrosis. No obvious focal lesions. A Foley catheter decompresses the bladder. Stomach/Bowel: Stomach is decompressed with an enteric tube tip in the body of the stomach. Small bowel are decompressed. Colon is mostly decompressed with scattered stool mostly in the cecum. No obvious obstruction. Appendix is not identified. Vascular/Lymphatic: Extensive vascular calcifications throughout the aorta and major vessels. Major vessels appear grossly patent although evaluation is limited due to motion artifact. No significant lymphadenopathy. Reproductive: Status post hysterectomy. No adnexal masses. Other: No obvious free air or free fluid in the abdomen. Musculoskeletal: Degenerative changes in the spine. No destructive bone lesions appreciated. Review of the MIP images confirms the above findings. IMPRESSION: 1. Examination is severely degraded by patient motion. 2. Cardiac enlargement with evidence of right heart failure. 3. Diffuse bilateral airspace disease throughout both lungs. This could indicate edema, multifocal pneumonia, ARDS, aspiration, or hemorrhage. Small bilateral pleural effusions. 4. Endotracheal and enteric tubes are in place. 5. No obvious acute abnormality demonstrated in the abdomen or pelvis. Electronically Signed   By: Lucienne Capers M.D.   On: 05/08/2017 21:33   Dg Chest Portable 1 View  Result Date: 04/22/2017 CLINICAL DATA:  Endotracheal tube placement EXAM: PORTABLE CHEST 1 VIEW COMPARISON:  Chest radiograph 11/04/2015 FINDINGS: The tip of the endotracheal tube is approximately 2.7 cm above the inferior margin of the carina. Retraction by 3 cm should result in optimal positioning. Otherwise, the examination is severely limited by external object overlying the chest. There are extensive bilateral airspace opacities, which are otherwise impossible to characterize on this study.  IMPRESSION: Endotracheal tube tip 2.7 cm above the inferior margin of the carina. Recommend retraction by 3 cm for optimum positioning. Electronically Signed   By: Ulyses Jarred M.D.   On: 04/30/2017 20:07    Procedures Procedure Name: Intubation Date/Time: 05/02/2017 9:41 PM Performed by: Aldona Lento Pre-anesthesia Checklist: Patient identified, Emergency Drugs available, Suction available and Patient being monitored Oxygen Delivery Method: Ambu bag Preoxygenation: Pre-oxygenation with 100% oxygen Induction Type: Cricoid  Pressure applied Ventilation: Mask ventilation without difficulty Laryngoscope Size: Mac and 4 Grade View: Grade II Number of attempts: 1 Airway Equipment and Method: Stylet Placement Confirmation: ETT inserted through vocal cords under direct vision Secured at: 24 cm Tube secured with: ETT holder Difficulty Due To: Difficulty was unanticipated      (including critical care time)  Medications Ordered in ED Medications  calcium chloride injection (1 g Intravenous Given 05/04/2017 1932)  sodium bicarbonate injection ( Intravenous Canceled Entry 05/08/2017 2015)  EPINEPHrine (ADRENALIN) 1 MG/10ML injection (1 mg Intravenous Given 05/07/2017 1941)  magnesium sulfate (IV Push/IM) injection (2 g Intravenous Given 05/14/2017 1936)  EPINEPHrine (ADRENALIN) 4 mg in dextrose 5 % 250 mL (0.016 mg/mL) infusion (0 mcg/min Intravenous Stopped 05/07/2017 2042)  fentaNYL 2517mg in NS 2559m(1064mml) infusion-PREMIX (50 mcg/hr Intravenous Rate/Dose Change 04/29/2017 2225)  midazolam (VERSED) 2 MG/2ML injection (not administered)  0.9 %  sodium chloride infusion (not administered)  pantoprazole (PROTONIX) injection 40 mg (40 mg Intravenous Given 04/30/2017 2116)  norepinephrine (LEVOPHED) 4 mg in dextrose 5 % 250 mL (0.016 mg/mL) infusion (5 mcg/min Intravenous New Bag/Given 05/08/2017 2251)  aspirin suppository 300 mg (not administered)  0.9 %  sodium chloride infusion ( Intravenous New Bag/Given  05/08/2017 2128)  heparin injection 5,000 Units (not administered)  midazolam (VERSED) injection 1-2 mg (2 mg Intravenous Given 05/11/2017 2224)  hydrocortisone sodium succinate (SOLU-CORTEF) 100 MG injection 100 mg (100 mg Intravenous Given 04/30/2017 2016)  diphenhydrAMINE (BENADRYL) injection 25 mg (25 mg Intravenous Given 05/09/2017 2018)  famotidine (PEPCID) IVPB 20 mg premix (0 mg Intravenous Stopped 05/08/2017 2100)  iopamidol (ISOVUE-370) 76 % injection (100 mLs  Contrast Given 05/16/2017 2052)  midazolam (VERSED) injection 2 mg (2 mg Intravenous Given 05/04/2017 2044)     Initial Impression / Assessment and Plan / ED Course  I have reviewed the triage vital signs and the nursing notes.  Pertinent labs & imaging results that were available during my care of the patient were reviewed by me and considered in my medical decision making (see chart for details).     This is a 70 68ar old female with PMH of atrial fibrillation, CKD, CHF, CAD, T2 DM, HLD, HTN, obesity, prior PE, ICD/pacemaker who presents as an active CPR in progress.    CPR continued via [facility] arrival.  Dual IV access acquired.  Multiple rounds of epinephrine given along with sodium bicarbonate, calcium chloride, magnesium. Rhythm PEA arrest with paced rhythm. Magnet placed but no change.  ROSC achieved after 26 total minutes of CPR and multiple rounds of epinephrine.  IVC nondilated, pleural effusions on bedside echo.  EKG post-ROSC shows sinus tachycardia with paced rhythm.  [facility] See procedure note above for details. ABG 7.23/25/11. Patient redosed with sodium bicarbonate.  IV fluids started along with Epinephrine drip. CT performed suggestive of ARDS-pattern. No evidence of PE, dissection, aneurysm. No major pericardial effusion.  Critical care team consulted who evaluated the patient at bedside and agreed on admission.  Sister was updated at bedside on arrival. All  questions answered.  Final Clinical Impressions(s) / ED Diagnoses   Final diagnoses:  Cardiopulmonary arrest ([place] failure with hypoxia ([facility])  Lactic acidosis  Metabolic acidosis    New Prescriptions New Prescriptions   No medications on file     BriAldona LentoD 04/25/2017 2308    JamTanna FurryD 05/13/2017 2355

## 2017-05-06 ENCOUNTER — Inpatient Hospital Stay (HOSPITAL_COMMUNITY): Payer: Medicare HMO

## 2017-05-06 ENCOUNTER — Encounter (HOSPITAL_COMMUNITY): Payer: Self-pay | Admitting: Critical Care Medicine

## 2017-05-06 DIAGNOSIS — I472 Ventricular tachycardia: Secondary | ICD-10-CM

## 2017-05-06 DIAGNOSIS — I469 Cardiac arrest, cause unspecified: Secondary | ICD-10-CM

## 2017-05-06 DIAGNOSIS — R569 Unspecified convulsions: Secondary | ICD-10-CM

## 2017-05-06 DIAGNOSIS — E872 Acidosis, unspecified: Secondary | ICD-10-CM

## 2017-05-06 DIAGNOSIS — Z452 Encounter for adjustment and management of vascular access device: Secondary | ICD-10-CM

## 2017-05-06 DIAGNOSIS — J9601 Acute respiratory failure with hypoxia: Secondary | ICD-10-CM

## 2017-05-06 HISTORY — PX: CENTRAL LINE INSERTION: NUR33

## 2017-05-06 LAB — CORTISOL: Cortisol, Plasma: >100 ug/dL

## 2017-05-06 LAB — URINALYSIS, ROUTINE W REFLEX MICROSCOPIC
BILIRUBIN URINE: NEGATIVE
Glucose, UA: 150 mg/dL — AB
Ketones, ur: 5 mg/dL — AB
Leukocytes, UA: NEGATIVE
Nitrite: NEGATIVE
Specific Gravity, Urine: 1.028 (ref 1.005–1.030)
pH: 6 (ref 5.0–8.0)

## 2017-05-06 LAB — GLUCOSE, CAPILLARY
GLUCOSE-CAPILLARY: 216 mg/dL — AB (ref 65–99)
Glucose-Capillary: 201 mg/dL — ABNORMAL HIGH (ref 65–99)

## 2017-05-06 LAB — POCT I-STAT 3, ART BLOOD GAS (G3+)
BICARBONATE: 6.4 mmol/L — AB (ref 20.0–28.0)
O2 Saturation: 85 %
PH ART: 6.605 — AB (ref 7.350–7.450)
PO2 ART: 113 mmHg — AB (ref 83.0–108.0)
TCO2: 8 mmol/L — ABNORMAL LOW (ref 22–32)
pCO2 arterial: 64.9 mmHg — ABNORMAL HIGH (ref 32.0–48.0)

## 2017-05-06 LAB — STREP PNEUMONIAE URINARY ANTIGEN: Strep Pneumo Urinary Antigen: NEGATIVE

## 2017-05-06 LAB — INFLUENZA PANEL BY PCR (TYPE A & B)
Influenza A By PCR: NEGATIVE
Influenza B By PCR: NEGATIVE

## 2017-05-06 LAB — MRSA PCR SCREENING: MRSA BY PCR: NEGATIVE

## 2017-05-06 LAB — HIV ANTIBODY (ROUTINE TESTING W REFLEX): HIV Screen 4th Generation wRfx: NONREACTIVE

## 2017-05-06 LAB — TROPONIN I: Troponin I: 15.18 ng/mL (ref <0.03)

## 2017-05-06 MED ORDER — SODIUM CHLORIDE 0.9% FLUSH: 10.0000 mL | INTRAVENOUS | Status: DC | PRN

## 2017-05-06 MED ORDER — LEVOFLOXACIN IN D5W 750 MG/150ML IV SOLN
750.0000 mg | INTRAVENOUS | Status: DC
  Administered 2017-05-06: 750 mg via INTRAVENOUS
  Filled 2017-05-06: qty 150

## 2017-05-06 MED ORDER — INSULIN ASPART 100 UNIT/ML ~~LOC~~ SOLN
2.0000 [IU] | SUBCUTANEOUS | Status: DC
Start: 2017-05-06 — End: 2017-05-06
  Administered 2017-05-06: 6 [IU] via SUBCUTANEOUS

## 2017-05-06 MED ORDER — SODIUM CHLORIDE 0.9 % IV SOLN
1000.0000 mg | Freq: Two times a day (BID) | INTRAVENOUS | Status: DC
  Filled 2017-05-06: qty 10

## 2017-05-06 MED ORDER — MORPHINE BOLUS VIA INFUSION
5.0000 mg | INTRAVENOUS | Status: DC | PRN
  Administered 2017-05-06: 10 mg via INTRAVENOUS
  Administered 2017-05-06: 5 mg via INTRAVENOUS
  Filled 2017-05-06: qty 20

## 2017-05-06 MED ORDER — GLYCOPYRROLATE 0.2 MG/ML IJ SOLN: 0.1000 mg | Freq: Two times a day (BID) | INTRAMUSCULAR | Status: DC

## 2017-05-06 MED ORDER — SODIUM CHLORIDE 0.9 % IV SOLN
10.0000 mg/h | INTRAVENOUS | Status: DC
  Administered 2017-05-06: 10 mg/h via INTRAVENOUS
  Filled 2017-05-06: qty 10

## 2017-05-06 MED ORDER — VANCOMYCIN HCL IN DEXTROSE 1-5 GM/200ML-% IV SOLN
1000.0000 mg | Freq: Once | INTRAVENOUS | Status: AC
  Administered 2017-05-06: 1000 mg via INTRAVENOUS
  Filled 2017-05-06: qty 200

## 2017-05-06 MED ORDER — CHLORHEXIDINE GLUCONATE CLOTH 2 % EX PADS: 6.0000 | MEDICATED_PAD | Freq: Every day | CUTANEOUS | Status: DC

## 2017-05-06 MED ORDER — SODIUM CHLORIDE 0.9% FLUSH
10.0000 mL | Freq: Two times a day (BID) | INTRAVENOUS | Status: DC
  Administered 2017-05-06: 10 mL

## 2017-05-07 ENCOUNTER — Telehealth: Payer: Self-pay

## 2017-05-07 LAB — LEGIONELLA PNEUMOPHILA SEROGP 1 UR AG: L. pneumophila Serogp 1 Ur Ag: NEGATIVE

## 2017-05-07 MED FILL — Medication: Qty: 1 | Status: AC

## 2017-05-07 NOTE — Telephone Encounter (Signed)
On 05/07/17 I received a death certificate from [facility] (Original). The death certificate is for cremation. The patient is a patient of Kristen Scatliffe. The death certificate will be taken to [facility] ([phone]) tomorrow am for signature.  On 10-May-2017 I received the death certificate back from Doctor Scatliffe. I got the death certificate ready and called the funeral home to let them know the d/c is ready for pickup. I also faxed a copy to the funeral home per the funeral home request.

## 2017-05-08 MED FILL — Medication: Qty: 1 | Status: AC

## 2017-05-09 LAB — BLOOD CULTURE ID PANEL (REFLEXED)
Acinetobacter baumannii: NOT DETECTED
CANDIDA PARAPSILOSIS: NOT DETECTED
CANDIDA TROPICALIS: NOT DETECTED
CARBAPENEM RESISTANCE: NOT DETECTED
Candida albicans: NOT DETECTED
Candida glabrata: NOT DETECTED
Candida krusei: NOT DETECTED
ENTEROCOCCUS SPECIES: NOT DETECTED
Enterobacter cloacae complex: NOT DETECTED
Enterobacteriaceae species: NOT DETECTED
Escherichia coli: NOT DETECTED
Haemophilus influenzae: NOT DETECTED
KLEBSIELLA PNEUMONIAE: NOT DETECTED
Klebsiella oxytoca: NOT DETECTED
Listeria monocytogenes: NOT DETECTED
Methicillin resistance: NOT DETECTED
Neisseria meningitidis: NOT DETECTED
PROTEUS SPECIES: NOT DETECTED
Pseudomonas aeruginosa: NOT DETECTED
SERRATIA MARCESCENS: NOT DETECTED
STAPHYLOCOCCUS AUREUS BCID: NOT DETECTED
STAPHYLOCOCCUS SPECIES: NOT DETECTED
STREPTOCOCCUS PNEUMONIAE: NOT DETECTED
Streptococcus agalactiae: NOT DETECTED
Streptococcus pyogenes: NOT DETECTED
Streptococcus species: DETECTED — AB
VANCOMYCIN RESISTANCE: NOT DETECTED

## 2017-05-10 LAB — CULTURE, BLOOD (ROUTINE X 2): SPECIAL REQUESTS: ADEQUATE

## 2017-05-11 LAB — CULTURE, BLOOD (ROUTINE X 2)
Culture: NO GROWTH
SPECIAL REQUESTS: ADEQUATE

## 2017-05-20 ENCOUNTER — Ambulatory Visit: Payer: Medicare HMO | Admitting: Physician Assistant

## 2017-05-20 NOTE — Progress Notes (Signed)
Aline attempted by RT but unsuccessful.

## 2017-05-20 NOTE — Progress Notes (Addendum)
RN observed asystole on monitor. This RN and [employer] [facility], RN auscultated no breath sounds or heart tones for one minute. Time of death 31. Family at bedside with patient. Family made aware. Dr. Jamison Neighbor made aware. CDS made aware, pt not candidate for donation.

## 2017-05-20 NOTE — Progress Notes (Signed)
Defibrillator interrogated and shows no ICD shocks no atrial fibrillation and only 3 episodes of monitored VT since January 2018.  Did have some high rate activity looks like it could've been atrial with some echo beats.  Will await further opinion of team and EP in the morning.  Darden Palmer MD [facility] 1:41 AM

## 2017-05-20 NOTE — Care Management Note (Signed)
Case Management Note  Patient Details  Name: DALEENA ROTTER MRN: [id number] Date of Birth: [birth year]  Subjective/Objective:   Expired.                 Action/Plan:   Expected Discharge Date:                  Expected Discharge Plan:     In-House Referral:     Discharge planning Services  CM Consult  Post Acute Care Choice:    Choice offered to:     DME Arranged:    DME Agency:     HH Arranged:    HH Agency:     Status of Service:  Completed, signed off  If discussed at [employer] of Stay Meetings, dates discussed:    Additional Comments:  Leone Haven, RN 05/15/2017, 11:00 AM

## 2017-05-20 NOTE — Progress Notes (Signed)
CRITICAL VALUE ALERT  Critical Value:  Troponin 15.18  Date & Time Notied:  06/01/2017 @ 0450  Provider Notified: Dr. Jamison Neighbor  Orders Received/Actions taken: no new orders at this time continue to monitor pt

## 2017-05-20 NOTE — Procedures (Signed)
ELECTROENCEPHALOGRAM REPORT  Date of Study: 05-24-17  Patient's Name: [name] MRN: [id number] Date of Birth: March 06, 1946  Referring Provider: Dr. Seward Carol  Clinical History: This is a 71 year old woman s/p cardiac arrest.  Medications: amiodarone (NEXTERONE PREMIX) 360-4.14 MG/200ML-% (1.8 mg/mL) IV infusion  aspirin suppository 300 mg  chlorhexidine gluconate (MEDLINE KIT) (PERIDEX) 0.12 % solution 15 mL  Chlorhexidine Gluconate Cloth 2 % PADS 6 each  EPINEPHrine (ADRENALIN) 4 mg in dextrose 5 % 250 mL (0.016 mg/mL) infusion  fentaNYL 2544mg in NS 2534m(1067mml) infusion-PREMIX  heparin injection 5,000 Units  insulin aspart (novoLOG) injection 2-6 Units  levofloxacin (LEVAQUIN) IVPB 750 mg  norepinephrine (LEVOPHED) 4 mg in dextrose 5 % 250 mL (0.016 mg/mL) infusion  pantoprazole (PROTONIX) injection 40 mg   Technical Summary: A multichannel digital EEG recording measured by the international 10-20 system with electrodes applied with paste and impedances below 5000 ohms performed in our laboratory with EKG monitoring in an intubated and unresponsive patient.  Hyperventilation and photic stimulation were not performed.  The digital EEG was referentially recorded, reformatted, and digitally filtered in a variety of bipolar and referential montages for optimal display.    Description: The patient is intubated and unresponsive during the recording. No sedating medications listed, she is hypothermic at 34.4 degrees Celsius. There is loss of normal background activity. The record read at a sensitivity of 3 uV/mm shows a burst-suppression pattern with bursts of diffuse 4-5 Hz theta preceded by myoclonic jerks with no epileptiform correlate lasting 3-4 seconds, in between diffuse background suppression lasting 2-60 seconds. There is no spontaneous reactivity or reactivity noted with noxious stimulation. There is muscle artifact over the frontal leads. Hyperventilation and  photic stimulation were not performed. There were no epileptiform discharges or electrographic seizures seen.   EKG lead was unremarkable.  Impression: This EEG is markedly abnormal due to burst-suppression pattern with myoclonic jerks.  Clinical Correlation: This record shows evidence of severe diffuse or bilateral cerebral dysfunction consistent with patient's history of anoxic brain injury. Myoclonic jerks did not show any associated underlying epileptiform correlate. In the absence of CNS active, sedating, or anesthetic medications, this suggests a poor prognosis. Clinical correlation is advised   KarEllouise Newer.D.

## 2017-05-20 NOTE — Progress Notes (Signed)
eLink Physician-Brief Progress Note Patient Name: [name] DOB: 1945-12-29 MRN: [id number]   Date of Service  2017/05/30  HPI/Events of Note  Portable chest x-ray reviewed postcentral line placement.   eICU Interventions  1. Central line ok to use 2. Order set placed      Intervention Category Intermediate Interventions: Diagnostic test evaluation  [name] 05-30-17, 2:56 AM

## 2017-05-20 NOTE — Procedures (Signed)
Extubation Procedure Note  Patient Details:   Name: [name] DOB: [birth year] MRN: [id number]   Airway Documentation:  Airway 7.5 mm (Active)  Secured at (cm) 25 cm 05/25/17  4:00 AM  Measured From Lips 05/25/17  4:00 AM  Secured Location Center 2017-05-25  4:00 AM  Secured By [employer] 05-25-2017  4:00 AM  Tube Holder Repositioned Yes 05-25-17  4:00 AM  Cuff Pressure (cm H2O) 25 cm H2O 05/14/2017 11:20 PM  Site Condition Dry 25-May-2017  4:00 AM    Evaluation  O2 sats: stable throughout Complications: No apparent complications Patient did tolerate procedure well. Bilateral Breath Sounds: Diminished   No   PT extubated per Withdrawal of life order. RN at bedside.  Jarelle Ates, Jodelle Green F May 25, 2017, 6:30 AM

## 2017-05-20 NOTE — Procedures (Signed)
Arterial Catheter Insertion Procedure Note [name] [id number] 1946-01-10  Procedure: Insertion of Arterial Catheter  Indications: Blood pressure monitoring and Frequent blood sampling  Procedure Details Consent: Risks of procedure as well as the alternatives and risks of each were explained to the (patient/caregiver).  Consent for procedure obtained. Time Out: Verified patient identification, verified procedure, site/side was marked, verified correct patient position, special equipment/implants available, medications/allergies/relevent history reviewed, required imaging and test results available.  Performed  Maximum sterile technique was used including antiseptics, cap, gloves, hand hygiene and mask. Skin prep: Chlorhexidine; local anesthetic administered 20 gauge catheter was inserted into right radial artery using the Seldinger technique.  Evaluation Blood flow poor; BP tracing poor. Complications: No apparent complications. Not inserted due to lack of blood flow.   [name] 05/16/2017

## 2017-05-20 NOTE — Progress Notes (Signed)
Wasted 175 ccs of fentanyl from IV bag in sink witnessed by second RN, [employer].

## 2017-05-20 NOTE — Consult Note (Signed)
Cardiology Consult Note  Admit date: 04/27/2017 Name: [name] 71 y.o.  female DOB:  1945-09-30 MRN:  [id number]  Today's date:  May 22, 2017  Referring Physician:    Critical Care Medicine  Primary Physician:    Dr. Leonides Sake  Reason for Consultation:    Cardiac arrest, ventricular tachycardia  IMPRESSIONS: 1.  PEA type cardiac arrest etiology unclear -recurrent episodes of ventricular tachycardia since then 2.  History of CAD with stent of right coronary artery reportedly patent in 2008 3.  History of cardiomyopathy with last EF of 55% by echo and 49% by a nuclear stress test in 2016 4.  Implantable biventricular defibrillator 5.  History of paroxysmal atrial fibrillation 6.  Anticoagulation 7.  Hypertensive heart disease by history 8.  Ventricular tachycardia  RECOMMENDATION: History is is unclear at this point in time.  Her initial troponin a couple of hours after the event was low.  I will obtain a repeat troponin.  She has been placed on amiodarone and is not having any defibrillator discharges that I can tell.  I think since it is now close to 5 hours following her presentation and with poor prognostic signs I discussed with the interventional cardiologist and don't think that she should be taken to the cath lab for intervention.  Continue anticoagulation, cycle enzymes and continue amiodarone.  Continue supportive measures.  Prognosis appears very poor here.  HISTORY: This 71 year old female presented to the emergency room of cardiac arrest.  She evidently had been sick for a couple of weeks with an upper respiratory infection shortness of breath but had not called anybody.  She was noted to be wheezing which she called family on the phone.  When EMS arrived she stood up to get on the stretcher and then had a PEA arrest.  She had CPR done without epinephrine evidently and when she presented to the [facility] ER she had continued CPR with epinephrine multiple shocks and intubation.   She had ROSC according to the critical care physician after 26 minutes here.  She then required some epinephrine and had runs of ventricular tachycardia noted.  She then had a CT scan of the chest and abdomen that did not show a pericardial effusion or pulmonary embolus or dissection.  She was transferred to the intensive care unit and was placed on a hypothermia protocol.  She evidently presented to the emergency room around 7:30 and was transferred to the ICU around 11.  When the critical care electronic M.D. reviewed the case he realized that nobody had obtained a cardiology consult and the nurse called me to come see the patient.  The patient was started on intravenous amiodarone in the ICU and ventricular tachycardia has settled down now somewhat.  The critical care physician also stated the family had stated that she should have been a DNR however EMS that already arrived and instituted the current treatment.  Her cardiac history is pieced together from the chart.  She has evidently a history of a previous RCA stent and had a previous ICD with generator change in 2014 by Dr. Johney Frame.  She evidently also had a prior history of hypertension and psoriasis rheumatoid arthritis and a prior left bundle branch block as well as atrial fibrillation.  She is on chronic anticoagulation.  She at one point had a low ejection fraction but improved with biventricular pacing.  She was last seen by Dr. Mayford Knife in 2016 had some vague chest pain.  She had a myocardial perfusion scan  in 2016 showing an EF of 49% with a large inferior scar.  She was seen in the device clinic after a long absence in January of this year.  Past Medical History:  Diagnosis Date  . Asthma   . Atrial fibrillation (HCC)   . Chronic renal insufficiency   . Chronic systolic dysfunction of left ventricle   . Coronary artery disease 2002   s/p PCI of RCA  . Depression   . Diabetes mellitus   . Fibromyalgia   . Hyperlipidemia   . Hypertension    . Ischemic dilated cardiomyopathy (HCC)    s/p BiV AICD - EF now 49%  . LBBB (left bundle branch block)   . Lupus   . Obesity   . Psoriasis   . Pulmonary embolism (HCC)    previously on coumadin  . RA (rheumatoid arthritis) (HCC)   . Sjogren's syndrome (HCC)   . Stroke [facility])    TIA     Past Surgical History:  Procedure Laterality Date  . ANGIOPLASTY  2002  . biventricular defibrillator implantation  08/12/07   MDT Concerto implanted by Dr Amil Amen; generator change 03-18-2013 by [name]  . hysterectomy (otheR)    . IMPLANTABLE CARDIOVERTER DEFIBRILLATOR GENERATOR CHANGE N/A 03/18/2013   MDT Viva XR BIV ICD generator change by [name]  . MASTECTOMY  1982    Allergies:  is allergic to contrast media [iodinated diagnostic agents]; levothyroxine; latex; penicillins; and sulfonamide derivatives.   Medications: Prior to Admission medications   Medication Sig Start Date End Date Taking? Authorizing Provider  atorvastatin (LIPITOR) 80 MG tablet Take 1 tablet (80 mg total) by mouth daily. 12/15/14   Quintella Reichert, MD  buPROPion (WELLBUTRIN) 100 MG tablet Take 100 mg by mouth 2 (two) times daily. 05/06/15   [provider]  carisoprodol (SOMA) 350 MG tablet Take 350 mg by mouth daily as needed for muscle spasms (pain).  01/01/13   [provider]  carvedilol (COREG) 3.125 MG tablet TAKE 1 TABLET BY MOUTH TWICE DAILY 06/25/14   Quintella Reichert, MD  Cholecalciferol (VITAMIN D) 2000 UNITS tablet Take 2,000 Units by mouth every evening.    [provider]  diclofenac sodium (VOLTAREN) 1 % GEL Apply 1 application topically daily as needed (pain).    [provider]  diltiazem (TIAZAC) 240 MG 24 hr capsule Take 1 capsule (240 mg total) by mouth every evening. 10/29/14   Quintella Reichert, MD  diphenhydrAMINE (BENADRYL) 25 MG tablet Take 25 mg by mouth daily as needed for allergies.    [provider]  DULoxetine (CYMBALTA) 60 MG capsule Take 60 mg by  mouth 2 (two) times daily.  10/22/14   [provider]  ezetimibe (ZETIA) 10 MG tablet Take 10 mg by mouth daily.    [provider]  furosemide (LASIX) 40 MG tablet Take 1 tablet (40 mg total) by mouth 2 (two) times daily. 09/21/13   Quintella Reichert, MD  hydrALAZINE (APRESOLINE) 10 MG tablet Take 1 tablet (10 mg total) by mouth 4 (four) times daily. 04/13/14   Quintella Reichert, MD  hyoscyamine (LEVSIN SL) 0.125 MG SL tablet Place 1 tablet under the tongue daily as needed. 10/05/15   [provider]  isosorbide dinitrate (ISORDIL) 30 MG tablet Take 15 mg by mouth daily.    [provider]  LANTUS 100 UNIT/ML injection Inject 36 Units into the skin at bedtime.  03/23/15   [provider]  linagliptin (  TRADJENTA) 5 MG TABS tablet Take 5 mg by mouth daily.    [provider]  LORazepam (ATIVAN) 1 MG tablet Take 1 mg by mouth 3 (three) times daily as needed for anxiety (sleep).     [provider]  oxyCODONE-acetaminophen (PERCOCET/ROXICET) 5-325 MG per tablet Take 1 tablet by mouth every 6 (six) hours as needed (pain).  06/19/13   [provider]  ranitidine (ZANTAC) 300 MG capsule Take 300 mg by mouth 2 (two) times daily. 09/29/14   [provider]  rOPINIRole (REQUIP) 1 MG tablet Take 1 mg by mouth at bedtime as needed (sleep/restless legs).  02/07/13   [provider]  spironolactone (ALDACTONE) 25 MG tablet Take 1 tablet (25 mg total) by mouth daily. Patient is overdue for an appt. Please call and schedule for further refills 08/24/16   Marily Lente, NP  SYNTHROID 150 MCG tablet Take 150 mg by mouth daily before breakfast. 10/31/15   [provider]  SYNTHROID 175 MCG tablet Take 175 mg by mouth daily before breakfast.  10/11/14   [provider]  tiZANidine (ZANAFLEX) 4 MG tablet Take 4 mg by mouth every 8 (eight) hours as needed. For muscle spasms 09/02/14   [provider]  warfarin (COUMADIN)  10 MG tablet Take 10 mg by mouth daily at 6 PM.    [provider]   Family History: Not obtainable due to cardiac arrest Social History:   Not obtainable evidently still lives alone.   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    Review of Systems: Not obtainable due to cardiac arrest  Physical Exam: BP (!) 90/46   Pulse (!) 51   Temp (!) 97.4 F (36.3 C) (Temporal)   Resp (!) 24   Wt 100.7 kg (222 lb 0.1 oz)   LMP  (LMP Unknown)   SpO2 (!) 87%   BMI 35.83 kg/m   General appearance: She is a pale white female unconscious intubated with startle reflex as well as periodic clonus currently being cooled Head: Normocephalic, without obvious abnormality, atraumatic Eyes: Pupils are somewhat fixed Neck: no carotid bruit and JVD is difficult to assess Lungs: clear to auscultation bilaterally ICD incision in left or chest Heart: Regular rate and rhythm, normal S1 and S2, no S3 Abdomen: soft, non-tender; bowel sounds normal; no masses,  no organomegaly Pelvic: deferred Extremities: Trace edema noted Pulses: Pedal pulses are only 1+ femoral pulses 1+ Skin: Skin color, texture, turgor normal. No rashes or lesions Neurologic: Cannot formally assess, pupils are mid position and sluggish, clonus is noted, startle reflex noted Psych: Unable to assess due to unconscious state  Labs: CBC  Recent Labs  05/28/17 2207  WBC 11.7*  RBC 4.15  HGB 12.1  HCT 37.9  PLT 178  MCV 91.3  MCH 29.2  MCHC 31.9  RDW 13.4   CMP   Recent Labs  May 28, 2017 2207  NA 137  K 4.5  CL 104  CO2 13*  GLUCOSE 156*  BUN 21*  CREATININE 1.98*  CALCIUM 8.7*  PROT 5.8*  ALBUMIN 2.2*  AST 522*  ALT 178*  ALKPHOS 135*  BILITOT 1.8*  GFRNONAA 24*  GFRAA 28*   BNP (last 3 results) BNP    Component Value Date/Time   BNP 447.0 (H) May 28, 2017 2207   BNP 25.0 09/02/2015 1532   Cardiac Panel (last 3  results) Troponin (Point of Care Test)  Recent Labs  05-22-17 1942  TROPIPOC 0.03   Cardiac Panel (last 3 results)  Recent Labs  05/22/17 2115  TROPONINI 0.03*     Radiology:  Extensive bilateral air space opacities on chest x-ray.  CT scan shows extensive air space disease in the lungs, no pericardial effusion, no pulmonary embolus  EKG: Paced rhythm with occasional PVCs Independently reviewed by me   Signed:  W. Ashley Royalty MD [facility]   Cardiology Consultant  05/18/2017, 12:47 AM

## 2017-05-20 NOTE — Progress Notes (Signed)
EEG completed, results pending. 

## 2017-05-20 NOTE — Procedures (Signed)
Central Venous Catheter Insertion Procedure Note AUTYMN OMLOR [id number] [birth year]  Procedure: Insertion of Central Venous Catheter Indications: Assessment of intravascular volume, Drug and/or fluid administration and Frequent blood sampling  Procedure Details Consent: Risks of procedure as well as the alternatives and risks of each were explained to the (patient/caregiver).  Consent for procedure obtained.- NOK sister Time Out: Verified patient identification, verified procedure, site/side was marked, verified correct patient position, special equipment/implants available, medications/allergies/relevent history reviewed, required imaging and test results available.  Performed  Maximum sterile technique was used including antiseptics, cap, gloves, gown, hand hygiene, mask and sheet. Skin prep: Chlorhexidine; local anesthetic administered A antimicrobial bonded/coated triple lumen catheter was placed in the right internal jugular vein using the Seldinger technique.  Evaluation Blood flow good Complications: No apparent complications Patient did tolerate procedure well. Chest X-ray ordered to verify placement.  CXR: normal.  Kristen D Scatliffe 05/09/2017, 5:24 AM  ..Marland Kitchen

## 2017-05-20 NOTE — Progress Notes (Signed)
GOALS OF CARE  Date of initial consult: 04/26/2017  Patient Name: [name] DOB:[birth year]  ZOX:[id number]  Age: 71 y.o.  Sex: female  Code status:     Code Status Orders        Start     Ordered   05/03/2017 0220  Do not attempt resuscitation (DNR)  Continuous    Question Answer Comment  In the event of cardiac or respiratory ARREST Do not call a "code blue"   In the event of cardiac or respiratory ARREST Do not perform Intubation, CPR, defibrillation or ACLS   In the event of cardiac or respiratory ARREST Use medication by any route, position, wound care, and other measures to relive pain and suffering. May use oxygen, suction and manual treatment of airway obstruction as needed for comfort.   Comments per sister patient expressed that it was her wish to not be resusucitated      05/04/2017 0220    Code Status History    Date Active Date Inactive Code Status Order ID Comments User Context   05/23/2017  9:19 PM 05/16/2017  2:20 AM Full Code [id number]  Carin Hock, MD ED   May 23, 2017  9:00 PM 05-23-2017  9:19 PM Full Code [id number]  Carin Hock, MD ED   03/18/2013 10:46 AM 03/18/2013  3:59 PM Full Code [id number]  Hillis Range, MD Inpatient      Attending provider: Carin Hock, MD  PCP: Johny Blamer     Reason for consultation: Establishing goals of care and Withdrawal of life-sustaining treatment   Patient's sister( next of kin) understanding of medical condition and prognosis:  Know diagnosis: yes  Knows prognosis: yes   Life-threating  Patient's preference about sharing medical information:  Patient and family may receive inoformation Family's or surrogate's awareness of illness:yes  Decision making preferences : Fully involved   Patient's sister;s understanding of illness and prognosis:yes   Hopes and concerns Sister expressed that patient would not want to be sustained by machines and did not want to be resuscitated. She also stated  that if she could not return to her baseline she would not want to continue on this course. Sister expressed that she is the next of kin, patient has no children or spouse  Decision made by family to initiate comfort care and withdraw life sustaining measures  Time spent: 30 minutes  Greater than 50%  of this time was spent counseling and coordinating care related to the above assessment and plan.    Carin Hock, MD 9/17/20185:28 AM

## 2017-05-20 NOTE — Discharge Summary (Signed)
Physician Discharge Summary   Patient ID: [name] [id number] 71 y.o. 1946/01/26  Admit date: 05/15/2017  Discharge date and time: 05/21/17  9:42 AM   Admitting Physician: Kandice Hams, MD   Discharge Physician: Kandice Hams  Admission Diagnoses: Cardiopulmonary arrest [facility]) [I46.9] Lactic acidosis [F81.0] Metabolic acidosis [F75.1] Acute respiratory failure with hypoxia ([place]) [J96.01]  Discharge Diagnoses: same as admitting diagnoses  Admission Condition: critical  Discharged Condition: critical. Patient expired  Indication for Admission: Critically ill patient s/p cardiopulmonary arrest  Hospital Course: 71 yr old female with PMHx of ischemic cardiomyopathy,HFrEF, s/p BiVICD, s/p RCA stent, Psoriasis, Lupus, Rhemuatoid arthritis, Sjogren, Stroke,LBBB, Afib, HTN on chronic antocoagulation with warfarin for previously diagnosed PE presents from home. Per her sister she had a horrible cough for several weeks gradually worsening and today when she spoke to her on the phone she could hear her sister wheezing. She told a neighbor and friend to go to her sister and the friend upon seeing the patient called EMS. Upon EMS arrival patient "passed out" no pulse palpated and compressions started by EMS. Initial rhythm was asystole then PEA. No epi was given in the field. Pt received first dose of epi in ER and was endotracheally intubated in ER. ROSC achieved in total time of 26 mins. CCM called to evaluate patient. Placed CVC in RIJ.started on Levophed at 5. Patient met criteria for CODE COOL started and 36 degree goal established. ABG and CT scan show decreased PF ratio with bilateral infiltrates indicative of ARDS High PEEP Low TV vent strategy initiated. Pt was having myoclionic jerking activity also wide complex events on EKG given h/o BiVICD consults placed to Cardiology and Neurology.Started on Amiodarone 150 bolus followed by drip. Cardiology interrogated BiV and no  shocks were delivered only 3 episodes of NSVT noted. Neurology ordered EEG which showed burst suppression. Poor prognosis.  Discussed with Sister of patient who stated pt was DNR and would not want to be on machines. She did not want to continue care and made decision to withdraw from life supoorting measures and initiate comfort care. Conversation witnessed by nurse. Offered [employer].     Consults: cardiology, pulmonary/intensive care and neurology  Procedures: [place]. Under Ultrasound guidance CXR reviewed by me personally Ok to use.  Bedside Critical Care Ultrasound performed evaluating cardiac windows and b/l lung windows-> performed subcostal and short parasternal views  Unable to appreciate movement of inferiolateral wall and RV appears large. IVC 2.25cm in size. Lungs bilaterally have evidence on Kerley B lines and some areas of hepatization suggestive of consolidation.  Significant Diagnostic Studies:  CT CHEST IMPRESSION: 1. Examination is severely degraded by patient motion. 2. Cardiac enlargement with evidence of right heart failure. 3. Diffuse bilateral airspace disease throughout both lungs. This could indicate edema, multifocal pneumonia, ARDS, aspiration, or hemorrhage. Small bilateral pleural effusions. 4. Endotracheal and enteric tubes are in place. 5. No obvious acute abnormality demonstrated in the abdomen or pelvis    CT Abdomen Pelvis:  IMPRESSION: 1. Examination is severely degraded by patient motion. 2. Cardiac enlargement with evidence of right heart failure. 3. Diffuse bilateral airspace disease throughout both lungs. This could indicate edema, multifocal pneumonia, ARDS, aspiration, or hemorrhage. Small bilateral pleural effusions. 4. Endotracheal and enteric tubes are in place. 5. No obvious acute abnormality demonstrated in the abdomen or pelvis. Treatments: IV hydration, antibiotics: vancomycin and Levaquin, analgesia:  Morphine, cardiac meds: amiodarone, insulin: regular, respiratory therapy: O2 and procedures: internal jugular  line  Discharge Exam: Expired female  Disposition: 20-Expired  Patient Instructions:  Allergies as of 05/31/2017      Reactions   Contrast Media [iodinated Diagnostic Agents] Hives, Itching   Levothyroxine Other (See Comments)   MUST BE BRAND NAME ONLY-generic doesn't work for patient   Latex Rash   Penicillins Rash   Sulfonamide Derivatives Rash      Medication List    ASK your doctor about these medications   atorvastatin 80 MG tablet Commonly known as:  LIPITOR Take 1 tablet (80 mg total) by mouth daily.   buPROPion 100 MG tablet Commonly known as:  WELLBUTRIN Take 100 mg by mouth 2 (two) times daily.   carisoprodol 350 MG tablet Commonly known as:  SOMA Take 350 mg by mouth daily as needed for muscle spasms (pain).   carvedilol 3.125 MG tablet Commonly known as:  COREG TAKE 1 TABLET BY MOUTH TWICE DAILY   diclofenac sodium 1 % Gel Commonly known as:  VOLTAREN Apply 1 application topically daily as needed (pain).   diltiazem 240 MG 24 hr capsule Commonly known as:  TIAZAC Take 1 capsule (240 mg total) by mouth every evening.   diphenhydrAMINE 25 MG tablet Commonly known as:  BENADRYL Take 25 mg by mouth daily as needed for allergies.   DULoxetine 60 MG capsule Commonly known as:  CYMBALTA Take 60 mg by mouth 2 (two) times daily.   ezetimibe 10 MG tablet Commonly known as:  ZETIA Take 10 mg by mouth daily.   furosemide 40 MG tablet Commonly known as:  LASIX Take 1 tablet (40 mg total) by mouth 2 (two) times daily.   hydrALAZINE 10 MG tablet Commonly known as:  APRESOLINE Take 1 tablet (10 mg total) by mouth 4 (four) times daily.   hyoscyamine 0.125 MG SL tablet Commonly known as:  LEVSIN SL Place 1 tablet under the tongue daily as needed.   isosorbide dinitrate 30 MG tablet Commonly known as:  ISORDIL Take 15 mg by mouth daily.    LANTUS 100 UNIT/ML injection Generic drug:  insulin glargine Inject 36 Units into the skin at bedtime.   LORazepam 1 MG tablet Commonly known as:  ATIVAN Take 1 mg by mouth 3 (three) times daily as needed for anxiety (sleep).   oxyCODONE-acetaminophen 5-325 MG tablet Commonly known as:  PERCOCET/ROXICET Take 1 tablet by mouth every 6 (six) hours as needed (pain).   ranitidine 300 MG capsule Commonly known as:  ZANTAC Take 300 mg by mouth 2 (two) times daily.   rOPINIRole 1 MG tablet Commonly known as:  REQUIP Take 1 mg by mouth at bedtime as needed (sleep/restless legs).   spironolactone 25 MG tablet Commonly known as:  ALDACTONE Take 1 tablet (25 mg total) by mouth daily. Patient is overdue for an appt. Please call and schedule for further refills   SYNTHROID 175 MCG tablet Generic drug:  levothyroxine Take 175 mg by mouth daily before breakfast.   SYNTHROID 150 MCG tablet Generic drug:  levothyroxine Take 150 mg by mouth daily before breakfast.   tiZANidine 4 MG tablet Commonly known as:  ZANAFLEX Take 4 mg by mouth every 8 (eight) hours as needed. For muscle spasms   TRADJENTA 5 MG Tabs tablet Generic drug:  linagliptin Take 5 mg by mouth daily.   Vitamin D 2000 units tablet Take 2,000 Units by mouth every evening.   warfarin 10 MG tablet Commonly known as:  COUMADIN Take 10 mg by mouth daily at 6  PM.      .  Signed: Rise Paganini Iain Sawchuk 05/07/2017 3:03 AM

## 2017-05-20 NOTE — Consult Note (Signed)
Neurology Consultation Reason for Consult: Anxoic injury, rule out seizures Referring Physician: Dr [name]   CC: Found unresponsive   History is obtained from:Chart review  HPI: [name] is a 71 y.o. female past medical history of CHF with BiV AICD, atrial fibrillation on anticoagulation, diabetes, hypertension, hyperlipidemia, chronic renal insufficiency who was found down and had a cardiopulmonary arrest with ROS see and 26 minutes. Arrived in the ED with asystole rhythm and intubated. Following resuscitation patient has been undergoing hypothermia protocol. She has been having intermittent myoclonic activity and neurology was consulted for possible seizures.   ROS:  Unable to obtain due to altered mental status.   Past Medical History:  Diagnosis Date  . Asthma   . Atrial fibrillation (HCC)   . Chronic renal insufficiency   . Chronic systolic dysfunction of left ventricle   . Coronary artery disease 2002   s/p PCI of RCA  . Depression   . Diabetes mellitus   . Fibromyalgia   . Hyperlipidemia   . Hypertension   . Ischemic dilated cardiomyopathy (HCC)    s/p BiV AICD - EF now 49%  . LBBB (left bundle branch block)   . Lupus   . Obesity   . Psoriasis   . Pulmonary embolism (HCC)    previously on coumadin  . RA (rheumatoid arthritis) (HCC)   . Sjogren's syndrome (HCC)   . Stroke [facility])    TIA    Family History  Problem Relation Age of Onset  . Heart disease Father   . Heart attack Father      Social History:  reports that she has quit smoking. She has never used smokeless tobacco. She reports that she does not drink alcohol or use drugs.  Exam: Current vital signs: BP 98/70   Pulse 78   Temp (!) 96.8 F (36 C) (Core (Comment))   Resp (!) 21   Ht 5' 6.14" (1.68 m)   Wt 100.9 kg (222 lb 7.1 oz)   LMP  (LMP Unknown)   SpO2 (!) 88%   BMI 35.75 kg/m  Vital signs in last 24 hours: Temp:  [95.5 F (35.3 C)-99 F (37.2 C)] 96.8 F (36 C) (09/17  0400) Pulse Rate:  [45-130] 78 (09/17 0430) Resp:  [16-38] 21 (09/17 0430) BP: (65-154)/(45-89) 98/70 (09/17 0430) SpO2:  [66 %-97 %] 88 % (09/17 0430) FiO2 (%):  [100 %] 100 % (09/17 0400) Weight:  [100.7 kg (222 lb 0.1 oz)-100.9 kg (222 lb 7.1 oz)] 100.9 kg (222 lb 7.1 oz) (09/17 0400)   Physical Exam  Constitutional: Appears well-developed and well-nourished.  Psych: Affect appropriate to situation Eyes: No scleral injection HENT: No OP obstrucion Head: Normocephalic.  Cardiovascular: regular  Respiratory: Effort normal and breath sounds normal to anterior ascultation GI: Soft.  No distension. There is no tenderness.  Skin: WDI  Neuro: Mental Status: Patient is is comatose, intubated. Cranial Nerves: Oculocephalic absent, pupils are 2 mm in diameter and very sluggish. Corneals present. Cough reflex present. Motor: Flexure posturing to sternal rub, myoclonic activity upon stimulation Sensory: Unable to assess, is not withdrawing to any of the 4 extremities Deep Tendon Reflexes: 1+ and symmetric in the biceps and patellae.  Plantars: Bilaterally mute Cerebellar: Unable to assess   ASSESSMENT AND PLAN 71 y.o. female past medical history of CHF with BiV AICD presents with asystole and ROS see 26 minutes consult to for my myoclonic jerks   Anoxic injury Myoclonic jerks  Plan Stat EEG- shows burst  suppression pattern and patient is off sedation. Patient did receive some sedation several hours ago. While it is still early to prognosticate as correlation with exam 24-48 hrs is needed, burst suppression pattern in the setting of cardiac arrest and myoclonic activity usually indicates poor neurological prognosis.  Will load With 1 g Keppra and continue 1g BID to reduced myoclonus. ( Depakote avoided due to hyperammonemia)  Correct underlying electrolyte abnormalities    Diamond Spinner Aroor MD [name] [phone]  If 7pm to 7am, please call on call as listed  on AMION.

## 2017-05-20 NOTE — Progress Notes (Signed)
225 mls IV Morphine wasted in sink with Christine Hinshaw,RN.  Morgue care completed.  Patient shoes taken to morgue with body.

## 2017-05-20 DEATH — deceased

## 2017-12-31 IMAGING — DX DG CHEST 1V PORT
1 series · 1 of 1 positions shown · non-contrast
Comparison: Chest radiograph 11/04/2015

CLINICAL DATA: Endotracheal tube placement

EXAM:
PORTABLE CHEST 1 VIEW

[chest ap]
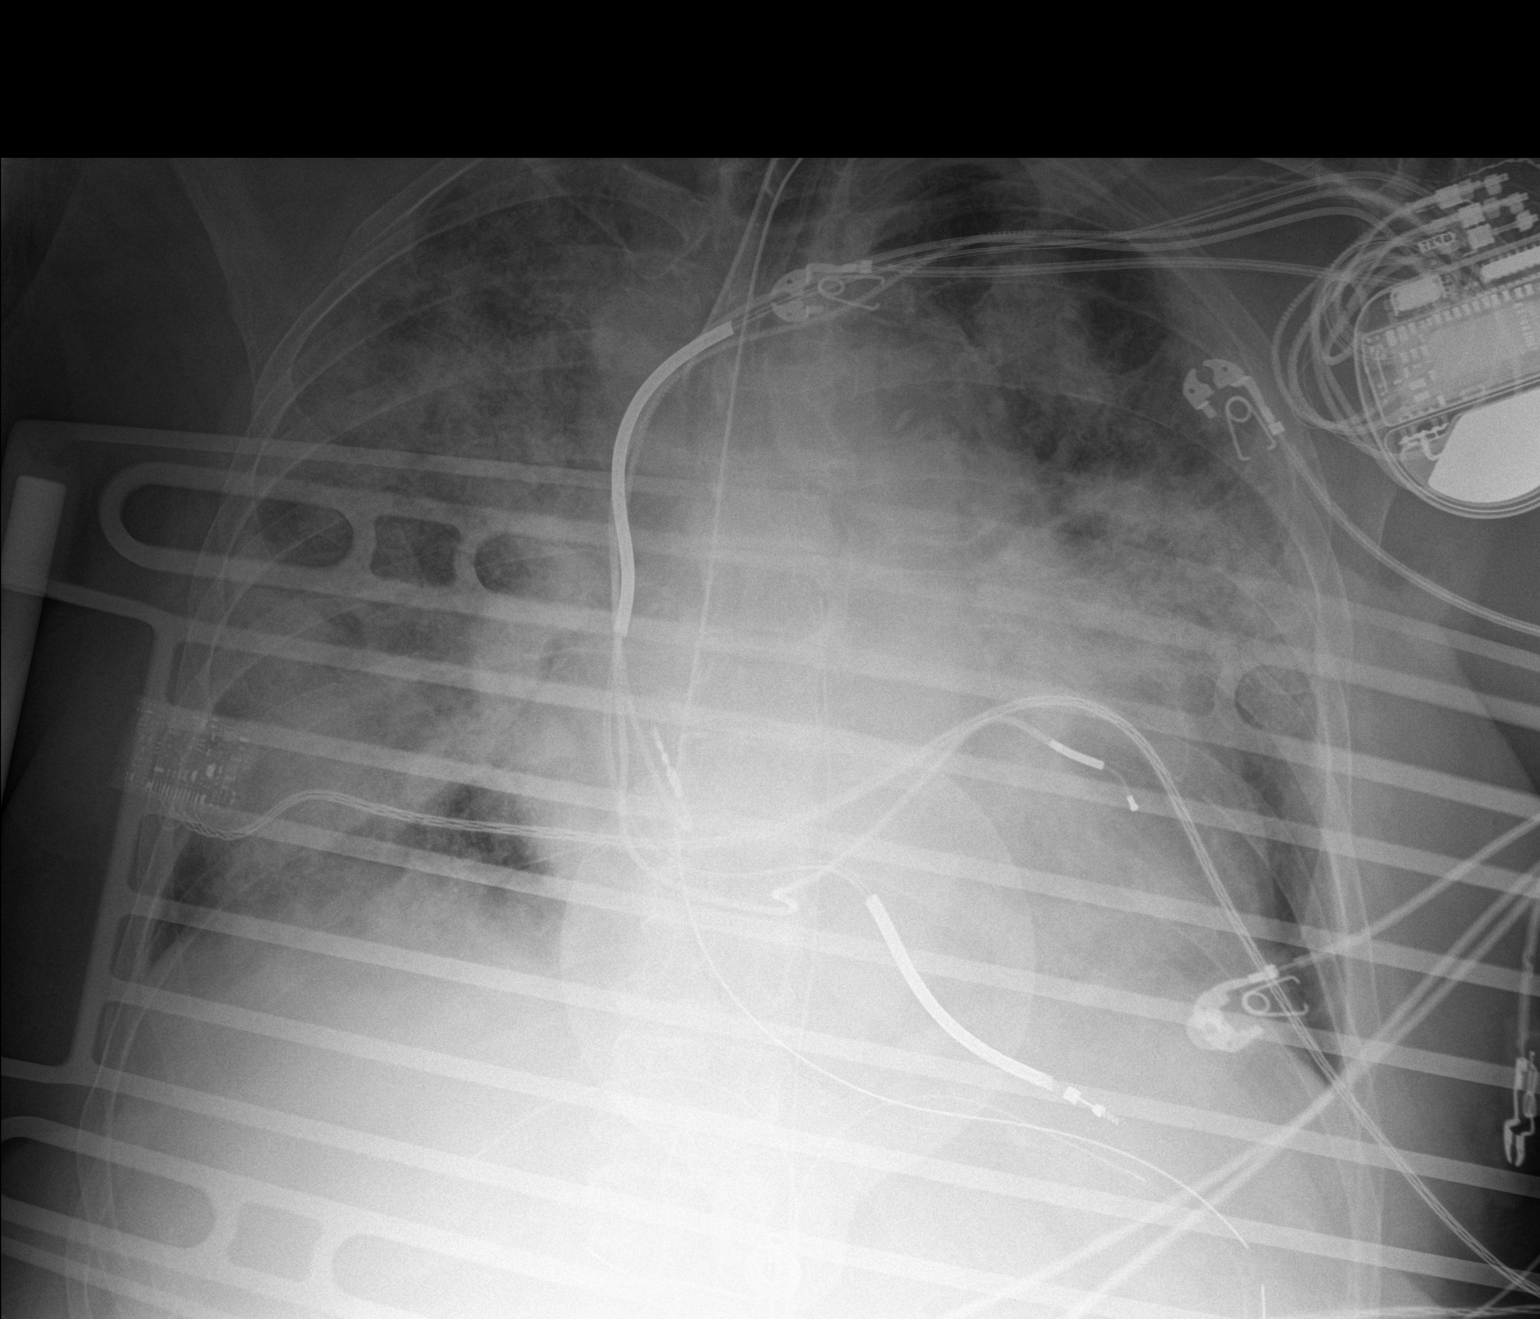

[1 of 1 positions shown; findings below may reference images not displayed]

FINDINGS: The tip of the endotracheal tube is approximately 2.7 cm above the
inferior margin of the carina. Retraction by 3 cm should result in
optimal positioning. Otherwise, the examination is severely limited
by external object overlying the chest. There are extensive
bilateral airspace opacities, which are otherwise impossible to
characterize on this study.
IMPRESSION: Endotracheal tube tip 2.7 cm above the inferior margin of the
carina. Recommend retraction by 3 cm for optimum positioning.

## 2018-01-01 IMAGING — DX DG CHEST 1V PORT
1 series · 1 of 1 positions shown · non-contrast
Comparison: 05/05/2017

CLINICAL DATA: Central line placement

EXAM:
PORTABLE CHEST 1 VIEW

[chest]
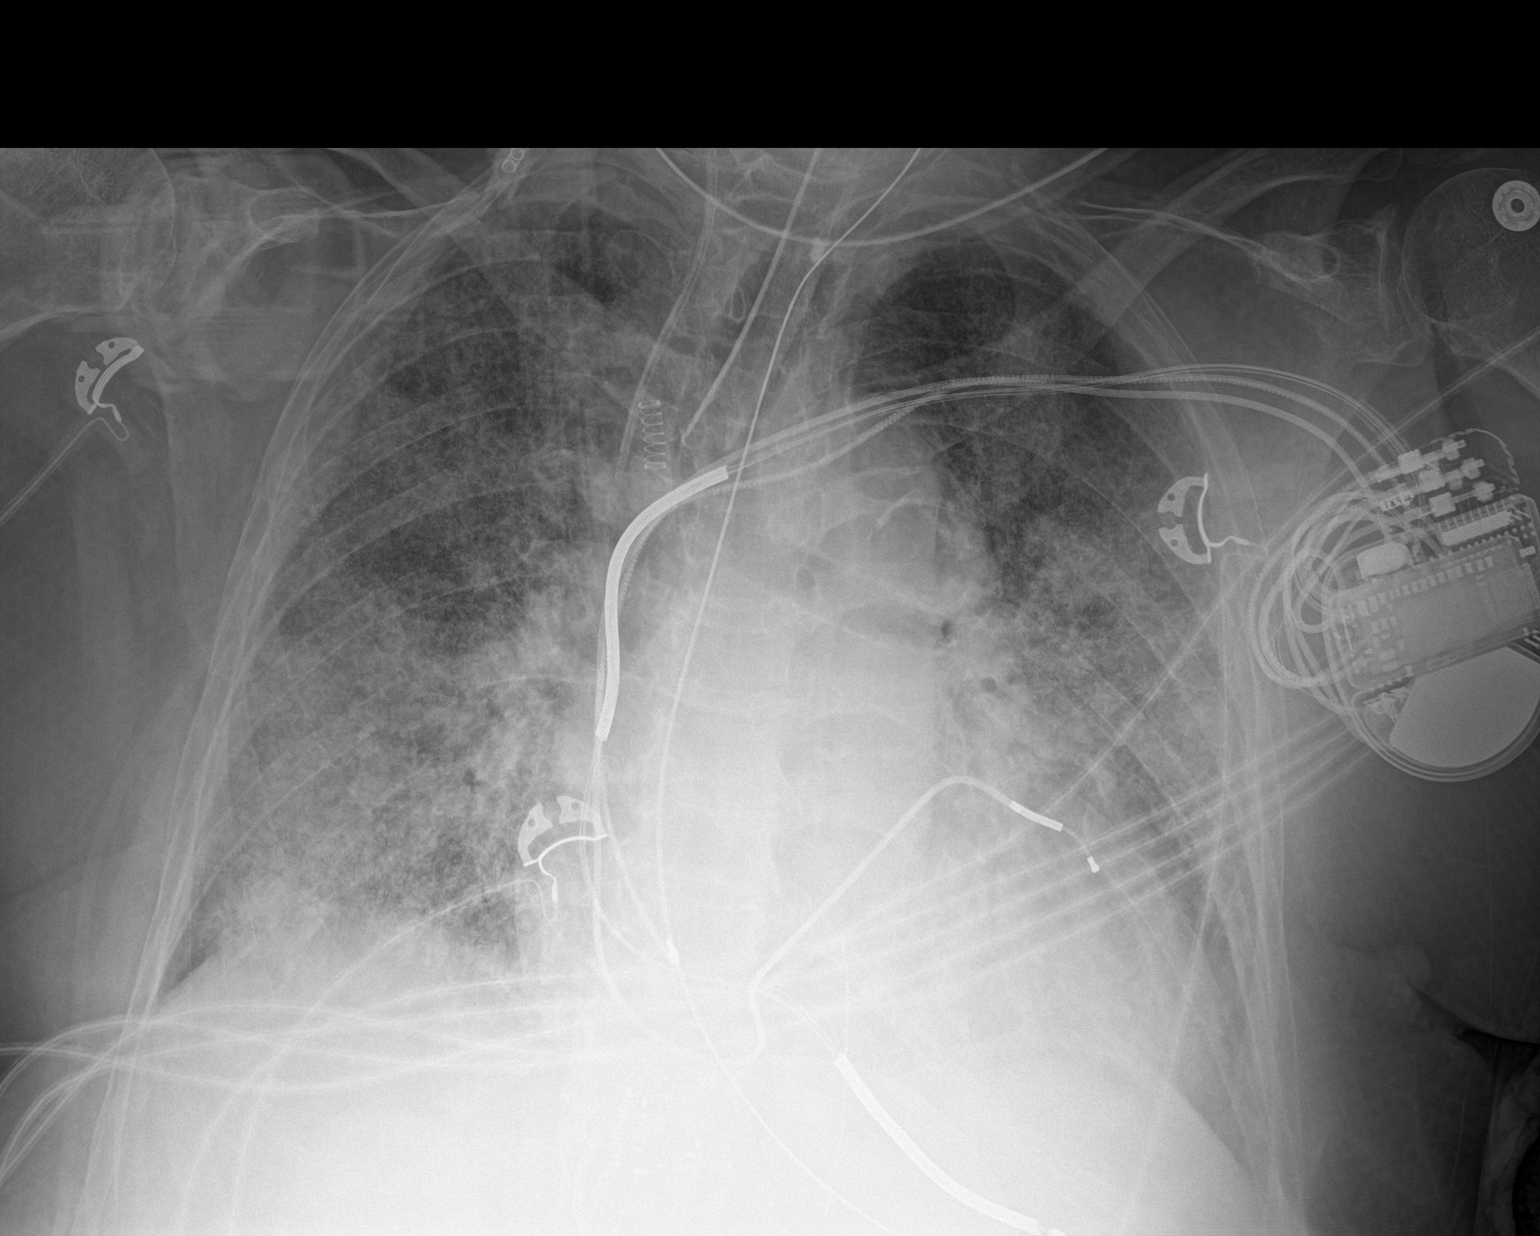

[1 of 1 positions shown; findings below may reference images not displayed]

FINDINGS: Endotracheal tube tip is about 2.2 cm superior to the carina.
Esophageal tube tip is below the diaphragm but not included.
Left-sided pacing device as before. Right-sided central venous
catheter tip overlies the brachiocephalic confluence. No right
pneumothorax is seen. Extensive interstitial and alveolar opacity.
Mild cardiomegaly. Aortic atherosclerosis.
IMPRESSION: 1. Right-sided central venous catheter tip overlies the
brachiocephalic confluence. No pneumothorax
2. Extensive interstitial and alveolar opacity, which may reflect
pulmonary edema, diffuse pneumonia, or ARDS
3. Mild cardiomegaly
